# Patient Record
Sex: Female | Born: 1935
Health system: Southern US, Community
[De-identification: ages and names within clinical notes are randomized; demographics above are authoritative.]

## PROBLEM LIST (undated history)

## (undated) DIAGNOSIS — K589 Irritable bowel syndrome without diarrhea: Secondary | ICD-10-CM

## (undated) DIAGNOSIS — T4145XA Adverse effect of unspecified anesthetic, initial encounter: Secondary | ICD-10-CM

## (undated) DIAGNOSIS — J302 Other seasonal allergic rhinitis: Secondary | ICD-10-CM

## (undated) DIAGNOSIS — R112 Nausea with vomiting, unspecified: Secondary | ICD-10-CM

## (undated) DIAGNOSIS — C4491 Basal cell carcinoma of skin, unspecified: Secondary | ICD-10-CM

## (undated) DIAGNOSIS — M719 Bursopathy, unspecified: Secondary | ICD-10-CM

## (undated) DIAGNOSIS — M1990 Unspecified osteoarthritis, unspecified site: Secondary | ICD-10-CM

## (undated) DIAGNOSIS — D1430 Benign neoplasm of unspecified bronchus and lung: Secondary | ICD-10-CM

## (undated) DIAGNOSIS — I5189 Other ill-defined heart diseases: Secondary | ICD-10-CM

## (undated) DIAGNOSIS — D190 Benign neoplasm of mesothelial tissue of pleura: Secondary | ICD-10-CM

## (undated) DIAGNOSIS — J309 Allergic rhinitis, unspecified: Secondary | ICD-10-CM

## (undated) DIAGNOSIS — T8859XA Other complications of anesthesia, initial encounter: Secondary | ICD-10-CM

## (undated) DIAGNOSIS — R05 Cough: Secondary | ICD-10-CM

## (undated) DIAGNOSIS — I4891 Unspecified atrial fibrillation: Secondary | ICD-10-CM

## (undated) DIAGNOSIS — H269 Unspecified cataract: Secondary | ICD-10-CM

## (undated) DIAGNOSIS — H9190 Unspecified hearing loss, unspecified ear: Secondary | ICD-10-CM

## (undated) DIAGNOSIS — Z9889 Other specified postprocedural states: Secondary | ICD-10-CM

## (undated) DIAGNOSIS — D869 Sarcoidosis, unspecified: Secondary | ICD-10-CM

## (undated) DIAGNOSIS — M8580 Other specified disorders of bone density and structure, unspecified site: Secondary | ICD-10-CM

## (undated) DIAGNOSIS — I480 Paroxysmal atrial fibrillation: Secondary | ICD-10-CM

## (undated) DIAGNOSIS — G4700 Insomnia, unspecified: Secondary | ICD-10-CM

## (undated) DIAGNOSIS — H409 Unspecified glaucoma: Secondary | ICD-10-CM

## (undated) DIAGNOSIS — R059 Cough, unspecified: Secondary | ICD-10-CM

## (undated) DIAGNOSIS — M7550 Bursitis of unspecified shoulder: Secondary | ICD-10-CM

## (undated) DIAGNOSIS — E785 Hyperlipidemia, unspecified: Secondary | ICD-10-CM

## (undated) DIAGNOSIS — T148XXA Other injury of unspecified body region, initial encounter: Secondary | ICD-10-CM

## (undated) DIAGNOSIS — Z9289 Personal history of other medical treatment: Secondary | ICD-10-CM

## (undated) DIAGNOSIS — Q2546 Tortuous aortic arch: Secondary | ICD-10-CM

## (undated) HISTORY — DX: Other specified disorders of bone density and structure, unspecified site: M85.80

## (undated) HISTORY — DX: Bursitis of unspecified shoulder: M75.50

## (undated) HISTORY — PX: WRIST SURGERY: SHX841

## (undated) HISTORY — DX: Other ill-defined heart diseases: I51.89

## (undated) HISTORY — DX: Basal cell carcinoma of skin, unspecified: C44.91

## (undated) HISTORY — DX: Benign neoplasm of unspecified bronchus and lung: D14.30

## (undated) HISTORY — DX: Hyperlipidemia, unspecified: E78.5

## (undated) HISTORY — DX: Bursopathy, unspecified: M71.9

## (undated) HISTORY — DX: Unspecified cataract: H26.9

## (undated) HISTORY — DX: Allergic rhinitis, unspecified: J30.9

## (undated) HISTORY — PX: VAGINAL HYSTERECTOMY: SHX2639

## (undated) HISTORY — PX: ABDOMINAL HYSTERECTOMY: SHX81

## (undated) HISTORY — DX: Unspecified atrial fibrillation: I48.91

## (undated) HISTORY — PX: APPENDECTOMY: SHX54

## (undated) HISTORY — DX: Insomnia, unspecified: G47.00

## (undated) HISTORY — PX: TONSILLECTOMY: SUR1361

## (undated) HISTORY — DX: Unspecified glaucoma: H40.9

## (undated) HISTORY — DX: Unspecified hearing loss, unspecified ear: H91.90

## (undated) HISTORY — DX: Tortuous aortic arch: Q25.46

## (undated) HISTORY — DX: Sarcoidosis, unspecified: D86.9

## (undated) HISTORY — DX: Other injury of unspecified body region, initial encounter: T14.8XXA

## (undated) HISTORY — DX: Paroxysmal atrial fibrillation: I48.0

## (undated) HISTORY — DX: Unspecified osteoarthritis, unspecified site: M19.90

## (undated) HISTORY — DX: Benign neoplasm of mesothelial tissue of pleura: D19.0

## (undated) HISTORY — DX: Irritable bowel syndrome, unspecified: K58.9

## (undated) HISTORY — DX: Other seasonal allergic rhinitis: J30.2

---

## 2001-07-02 ENCOUNTER — Emergency Department (HOSPITAL_COMMUNITY): Admission: EM | Admit: 2001-07-02 | Discharge: 2001-07-02 | Payer: Self-pay | Admitting: Emergency Medicine

## 2001-07-03 ENCOUNTER — Encounter: Payer: Self-pay | Admitting: Cardiology

## 2001-07-03 ENCOUNTER — Ambulatory Visit (HOSPITAL_COMMUNITY): Admission: RE | Admit: 2001-07-03 | Discharge: 2001-07-03 | Payer: Self-pay | Admitting: Cardiology

## 2001-09-30 ENCOUNTER — Other Ambulatory Visit: Admission: RE | Admit: 2001-09-30 | Discharge: 2001-09-30 | Payer: Self-pay | Admitting: Internal Medicine

## 2001-10-06 ENCOUNTER — Encounter: Admission: RE | Admit: 2001-10-06 | Discharge: 2001-10-06 | Payer: Self-pay | Admitting: Internal Medicine

## 2001-10-06 ENCOUNTER — Encounter: Payer: Self-pay | Admitting: Internal Medicine

## 2003-11-16 ENCOUNTER — Encounter: Admission: RE | Admit: 2003-11-16 | Discharge: 2003-11-16 | Payer: Self-pay | Admitting: Internal Medicine

## 2004-11-17 ENCOUNTER — Encounter: Admission: RE | Admit: 2004-11-17 | Discharge: 2004-11-17 | Payer: Self-pay | Admitting: Internal Medicine

## 2005-04-30 DIAGNOSIS — D190 Benign neoplasm of mesothelial tissue of pleura: Secondary | ICD-10-CM

## 2005-04-30 HISTORY — DX: Benign neoplasm of mesothelial tissue of pleura: D19.0

## 2005-04-30 HISTORY — PX: FRACTURE SURGERY: SHX138

## 2005-05-10 ENCOUNTER — Inpatient Hospital Stay (HOSPITAL_COMMUNITY): Admission: RE | Admit: 2005-05-10 | Discharge: 2005-05-12 | Payer: Self-pay | Admitting: Obstetrics and Gynecology

## 2005-05-10 ENCOUNTER — Encounter (INDEPENDENT_AMBULATORY_CARE_PROVIDER_SITE_OTHER): Payer: Self-pay | Admitting: Specialist

## 2005-05-16 ENCOUNTER — Observation Stay (HOSPITAL_COMMUNITY): Admission: AD | Admit: 2005-05-16 | Discharge: 2005-05-17 | Payer: Self-pay | Admitting: Obstetrics & Gynecology

## 2005-08-13 ENCOUNTER — Emergency Department (HOSPITAL_COMMUNITY): Admission: EM | Admit: 2005-08-13 | Discharge: 2005-08-13 | Payer: Self-pay | Admitting: Emergency Medicine

## 2005-08-21 ENCOUNTER — Encounter: Admission: RE | Admit: 2005-08-21 | Discharge: 2005-08-21 | Payer: Self-pay | Admitting: Internal Medicine

## 2005-09-03 ENCOUNTER — Encounter: Admission: RE | Admit: 2005-09-03 | Discharge: 2005-09-03 | Payer: Self-pay | Admitting: Internal Medicine

## 2005-10-01 ENCOUNTER — Encounter (INDEPENDENT_AMBULATORY_CARE_PROVIDER_SITE_OTHER): Payer: Self-pay | Admitting: Specialist

## 2005-10-01 ENCOUNTER — Ambulatory Visit (HOSPITAL_COMMUNITY): Admission: RE | Admit: 2005-10-01 | Discharge: 2005-10-01 | Payer: Self-pay | Admitting: Surgery

## 2005-10-05 ENCOUNTER — Ambulatory Visit: Payer: Self-pay | Admitting: Internal Medicine

## 2005-11-15 ENCOUNTER — Ambulatory Visit: Payer: Self-pay | Admitting: Internal Medicine

## 2005-11-20 ENCOUNTER — Ambulatory Visit: Payer: Self-pay | Admitting: Internal Medicine

## 2005-11-20 ENCOUNTER — Encounter: Payer: Self-pay | Admitting: Cardiology

## 2005-11-20 ENCOUNTER — Ambulatory Visit: Payer: Self-pay

## 2005-11-29 ENCOUNTER — Ambulatory Visit: Payer: Self-pay | Admitting: Internal Medicine

## 2005-11-29 ENCOUNTER — Inpatient Hospital Stay (HOSPITAL_COMMUNITY): Admission: RE | Admit: 2005-11-29 | Discharge: 2005-11-29 | Payer: Self-pay | Admitting: Internal Medicine

## 2005-12-03 ENCOUNTER — Inpatient Hospital Stay (HOSPITAL_COMMUNITY): Admission: RE | Admit: 2005-12-03 | Discharge: 2005-12-06 | Payer: Self-pay | Admitting: Surgery

## 2005-12-03 ENCOUNTER — Encounter (INDEPENDENT_AMBULATORY_CARE_PROVIDER_SITE_OTHER): Payer: Self-pay | Admitting: Specialist

## 2005-12-03 HISTORY — PX: OTHER SURGICAL HISTORY: SHX169

## 2005-12-11 ENCOUNTER — Encounter: Admission: RE | Admit: 2005-12-11 | Discharge: 2005-12-11 | Payer: Self-pay | Admitting: Surgery

## 2006-01-14 ENCOUNTER — Ambulatory Visit: Payer: Self-pay | Admitting: Internal Medicine

## 2006-01-21 ENCOUNTER — Ambulatory Visit: Payer: Self-pay | Admitting: Internal Medicine

## 2006-01-30 ENCOUNTER — Encounter: Admission: RE | Admit: 2006-01-30 | Discharge: 2006-01-30 | Payer: Self-pay | Admitting: Internal Medicine

## 2006-02-26 ENCOUNTER — Ambulatory Visit: Payer: Self-pay | Admitting: Internal Medicine

## 2006-04-30 HISTORY — PX: ROTATOR CUFF REPAIR: SHX139

## 2006-05-07 ENCOUNTER — Ambulatory Visit: Payer: Self-pay | Admitting: Internal Medicine

## 2006-07-12 ENCOUNTER — Ambulatory Visit (HOSPITAL_COMMUNITY): Admission: RE | Admit: 2006-07-12 | Discharge: 2006-07-13 | Payer: Self-pay | Admitting: Orthopedic Surgery

## 2006-10-31 ENCOUNTER — Ambulatory Visit: Payer: Self-pay | Admitting: Internal Medicine

## 2007-01-21 ENCOUNTER — Ambulatory Visit: Payer: Self-pay | Admitting: Surgery

## 2007-01-21 ENCOUNTER — Encounter: Admission: RE | Admit: 2007-01-21 | Discharge: 2007-01-21 | Payer: Self-pay | Admitting: Surgery

## 2007-02-11 ENCOUNTER — Encounter: Admission: RE | Admit: 2007-02-11 | Discharge: 2007-02-11 | Payer: Self-pay | Admitting: Internal Medicine

## 2007-03-06 ENCOUNTER — Ambulatory Visit: Payer: Self-pay | Admitting: Internal Medicine

## 2007-12-24 ENCOUNTER — Ambulatory Visit: Payer: Self-pay | Admitting: Internal Medicine

## 2008-02-10 ENCOUNTER — Encounter: Admission: RE | Admit: 2008-02-10 | Discharge: 2008-02-10 | Payer: Self-pay | Admitting: Surgery

## 2008-02-10 ENCOUNTER — Ambulatory Visit: Payer: Self-pay | Admitting: Surgery

## 2008-02-24 ENCOUNTER — Encounter: Admission: RE | Admit: 2008-02-24 | Discharge: 2008-02-24 | Payer: Self-pay | Admitting: Obstetrics and Gynecology

## 2009-02-23 ENCOUNTER — Encounter: Admission: RE | Admit: 2009-02-23 | Discharge: 2009-02-23 | Payer: Self-pay | Admitting: Internal Medicine

## 2009-03-29 ENCOUNTER — Encounter: Admission: RE | Admit: 2009-03-29 | Discharge: 2009-03-29 | Payer: Self-pay | Admitting: Surgery

## 2009-03-29 ENCOUNTER — Ambulatory Visit: Payer: Self-pay | Admitting: Surgery

## 2010-03-28 ENCOUNTER — Encounter: Admission: RE | Admit: 2010-03-28 | Discharge: 2010-03-28 | Payer: Self-pay | Admitting: Surgery

## 2010-03-28 ENCOUNTER — Ambulatory Visit: Payer: Self-pay | Admitting: Surgery

## 2010-04-11 ENCOUNTER — Encounter
Admission: RE | Admit: 2010-04-11 | Discharge: 2010-04-11 | Payer: Self-pay | Source: Home / Self Care | Attending: Internal Medicine | Admitting: Internal Medicine

## 2010-05-21 ENCOUNTER — Encounter: Payer: Self-pay | Admitting: Surgery

## 2010-09-12 NOTE — Assessment & Plan Note (Signed)
[facility]                         ELECTROPHYSIOLOGY OFFICE NOTE   NAME:[name], Andrea HOAGLUND                          MRN:          [id number]  DATE:12/24/2007                            DOB:          1935-12-20    [name] comes in today with her husband bringing tomatoes.  She has  had very little racing over the last year, she does have an occasional  skip.   Her medications are unchanged.  Her blood pressure today was 90/60 with  a pulse of 76, weight was 152 which is down a 2 pounds.  Lungs were  clear.  Heart sounds were regular.  Extremities were without edema.   Electrocardiogram demonstrates low voltage, which is chronic.  The rate  was 83 in sinus.  The intervals were 0.18/0.07/0.38.   IMPRESSION:  1. Atrioventricular nodal reentry status post slow pathway      modification.  2. Some residual atrial arrhythmias, fairly quiescent.   [name] is doing well at this time.   We will plan to see her again in 1 year's time.     Duke Salvia, MD, [facility]  Electronically Signed    SCK/MedQ  DD: 12/24/2007  DT: 12/25/2007  Job #: [id number]

## 2010-09-12 NOTE — Assessment & Plan Note (Signed)
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  I saw her last on March 29, 2009.  She has been doing  well since then and has no complaints.  She denies any chest pain.  She  states that she gets winded with heavy activity, but this is not new.  She has had no cough or hemoptysis.   PHYSICAL EXAMINATION:  Blood pressure 114/64, pulse 96 and regular, and  respiratory rate 16 and unlabored.  Oxygen saturation on room air is  98%.  She looks well.  Cardiac exam shows regular rate and rhythm with  normal heart sounds.  Her lung exam is clear.  The left thoracotomy  incision is well healed.  There are no skin lesions.  There is no  cervical, supraclavicular, or axillary adenopathy.   DIAGNOSTIC TESTS:  Chest x-ray today shows clear lung fields and no  pleural effusions.  There is no evidence of recurrent or metastatic  disease.   IMPRESSION:  The patient continues to do well without evidence of  recurrent or metastatic tumor.  I will plan to see her back in 1 year  with a repeat chest x-ray.   Evelene Croon, M.D.  Electronically Signed   BB/MEDQ  D:  03/28/2010  T:  03/28/2010  Job:  [id number]

## 2010-09-12 NOTE — Assessment & Plan Note (Signed)
OFFICE VISIT   [name], Andrea B  DOB:  November 15, 1935                                        February 10, 2008  CHART #:  [id number]   The patient returned today for followup, status post left thoracotomy  and wedge resection of left lower lobe lung mass on December 03, 2005,  which proved to be a solitary fibrous tumor of the pleura on pathologic  examination.  Since I last saw her on January 22, 2007, she has been  feeling well with no chest discomfort.  She has had no shortness of  breath and no sputum production.   PHYSICAL EXAMINATION:  VITAL SIGNS:  Her blood pressure 131/72, her  pulse is 88 and regular, and respiratory rate 18, unlabored.  Oxygen  saturation on room air is 99%.  GENERAL:  She looks well.  CARDIAC:  Regular rate and rhythm with normal heart sounds.  LUNGS:  Clear.  CHEST:  The left chest incision is well healed.  There are no chest wall  lesions palpable.   Followup chest x-ray today shows chronic postoperative changes in the  left lung apex.  There is no sign of recurrent tumor.   IMPRESSION:  Overall, the patient is recovering well and shows no sign  of recurrent solitary fibrous tumor.  I will plan to see her back in 1  year with a repeat chest x-ray.   Evelene Croon, M.D.  Electronically Signed   BB/MEDQ  D:  02/10/2008  T:  02/11/2008  Job:  [id number]

## 2010-09-12 NOTE — Assessment & Plan Note (Signed)
OFFICE VISIT   [name], Andrea B  DOB:  05-07-1935                                        March 29, 2009  CHART #:  [id number]   HISTORY:  The patient returned today for followup status post left  thoracotomy and wedge resection of a left lower lobe lung mass on December 03, 2005.  This was a solitary fibrous tumor of the pleura on pathologic  examination.  I last saw her on February 10, 2008, at which time she was  doing well.  Since her last visit, she has had no complaints.  She has  had no chest pain or shortness of breath.  She denies any cough or  hemoptysis.   PHYSICAL EXAMINATION:  Today, blood pressure is 127/69, pulse is 78 and  regular, respiratory rate is 18 and unlabored.  Oxygen saturation on  room air is 98%.  She looks well.  Cardiac exam shows a regular rate and  rhythm with normal heart sounds.  Her lung exam is clear.  The left  thoracotomy incision is well healed.  There are no skin lesions.  There  is no cervical or supraclavicular adenopathy.   DIAGNOSTIC TESTS:  Followup chest x-ray shows clear lung fields and no  pleural effusions.  There is no sign of any recurrent or metastatic  disease.   IMPRESSION:  Overall, the patient is making a continued good recovery  following her surgery.  She has no evidence of recurrent or metastatic  disease.  I recommended that we continue seeing her back yearly with a  followup chest x-ray.   Evelene Croon, M.D.  Electronically Signed   BB/MEDQ  D:  03/29/2009  T:  03/30/2009  Job:  [id number]
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
## 2010-09-12 NOTE — Assessment & Plan Note (Signed)
OFFICE VISIT   [name], Andrea B  DOB:  28-Feb-1936                                        January 22, 2007  CHART #:  [id number]   The patient returns today for follow up, approximately 13 months status  post left thoracotomy and wedge resection of a left lower lobe lung mass  that was found to be a solitary, fibrous tumor of the pleura on  pathologic examination.  She has been feeling well overall with minimal  residual chest was discomfort.  She has had no cough or sputum  production.  No shortness of breath.   PHYSICAL EXAMINATION:  VITAL SIGNS:  Blood pressure 116/66, pulse 93 and  regular, respiratory rate 18 and unlabored.  Oxygen saturation is 98% on  room air.  GENERAL:  She looks well.  LUNGS: Clear.  The left chest incision is well-healed.  CARDIAC:  Regular rate and rhythm with normal heart sounds.   A follow up chest x-ray shows no active disease.  There was no sign of  recurrent tumor.   IMPRESSION:  The patient is 13 months status post resection of a  solitary fibrous tumor.  The risk of recurrence is extremely low, but  this should be followed up with another chest x-ray in about 1 year.  We  will see her back at that time.   Evelene Croon, M.D.  Electronically Signed   BB/MEDQ  D:  01/22/2007  T:  01/23/2007  Job:  [id number]

## 2010-09-15 NOTE — Op Note (Signed)
NAMEDESARE, DUDDY                   ACCOUNT NO.:  [id number]   MEDICAL RECORD NO.:  [id number]          PATIENT TYPE:  INP   LOCATION:  2550                         FACILITY:  [facility]   PHYSICIAN:  Evelene Croon, M.D.     DATE OF BIRTH:  Nov 22, 1935   DATE OF PROCEDURE:  12/03/2005  DATE OF DISCHARGE:                                 OPERATIVE REPORT   PREOPERATIVE DIAGNOSIS:  Pleural-based left chest mass, suspect benign  fibrous tumor.   POSTOPERATIVE DIAGNOSIS:  Pleural-based left chest mass, suspect benign  fibrous tumor.   OPERATIVE PROCEDURE:  Left video-assisted thoracoscopy, left thoracotomy  with wedge resection of left lower lobe lung mass.  Insertion of On-Q pain  pump.   SURGEON:  Evelene Croon, M.D.   ASSISTANT:  Jerold Coombe, PA-C.   ANESTHESIA:  General endotracheal.   CLINICAL HISTORY:  This patient is a 75 year old woman with a history of  mitral valve prolapse and tachyarrhythmias who had a chest x-ray performed  when she presented with an episode of tachyarrhythmia that showed a  posteriorly located mass obscuring the left hemidiaphragm.  She underwent a  CT scan of the chest that showed a 3.7 x 7.7 cm ovoid pleural-based mass in  the posterior inferior left hemithorax.  It did have calcium in it  suggesting a benign process.  She subsequently underwent a CT-guided needle  biopsy which showed fibrous tissue consistent with a benign fibrous tumor.  Given its size, it was felt that this should be resected.  I discussed the  operative procedure with the patient and her husband including alternatives,  benefits, and risks including bleeding, blood transfusion, infection, injury  to the lung, prolonged air leak, and recurrence of the tumor.  They  understood and agreed to proceed.   OPERATIVE PROCEDURE:  The patient was taken to the operating room and placed  on the table in supine position.  After induction of general endotracheal  anesthesia using a  double-lumen endotracheal tube, a Foley catheter was  placed in the bladder using sterile technique.  Pneumatic compression  stockings were placed on the legs.  Preoperative intravenous antibiotics  were given.  The patient was then turned in the right lateral decubitus  position with the left side up.  The left side of the chest was prepped with  Betadine soap and solution and draped in the usual sterile manner.  A time-  out was taken and the proper patient, proper operative side and proper  operation were confirmed with the nursing and anesthesia staff.   Then a 1 cm incision was made in the mid axillary line over the fifth  intercostal space and a 10 mm trocar was inserted.  The thoracoscope was  inserted and the pleural space examined.  There was a mass in the posterior  inferior pleural space which appeared to be coming from the edge of the left  lower lobe.  There was some adhesion in one area to the diaphragm.  This was  a well-circumscribed lesion that did have some calcium in it and appeared to  be a benign lesion.  There were no other lesions noted in the __________  space.  There was no significant pleural effusion.   Given the size of the tumor, I felt that I would have to make a small  thoracotomy incision in order to remove it.  Therefore, a short lateral  thoracotomy incision was made.  The latissimus muscle was divided but the  serratus muscle was retracted anteriorly.  The pleural space was entered  through the sixth intercostal space.  Then with video assistance, I was able  to divide the adhesion between the mass and the diaphragm.  These were  loose, filmy adhesions.  There did not appear to be any invasion of the  diaphragm.  After these adhesions were divided it was easy to retract the  tumor up into the incision.  There was a narrow-based attachment to the edge  of left lower lobe.  A wedge resection was performed using a 45 mm linear  stapler. The specimen was  passed to nursing and given to pathology for  permanent section.  The staple line was hemostatic.  Then an On-Q pain  catheter was inserted.  A small stab incision was made inferior to the chest  incision.  The introducer and trocar were inserted and advanced in a  subpleural location to cross the interspace of the thoracotomy posteriorly.  Then the catheter was inserted through the introducer and introducer  removed.  The catheter was fixed to the skin.  Then the catheter was primed  with 5 mL of 0.5% Marcaine solution.  It was then connected to the pain  pump.  Then a single 28-French chest tube was brought through a separate  stab incision and positioned in the posterior hemithorax.  The lung was re-  expanded.  The ribs were reapproximated with #2 Vicryl pericostal sutures.  The serratus muscle was returned to is normal anatomic position.  The  latissimus muscle was reapproximated with a continuous #1 Vicryl suture.  The subcutaneous tissue was closed with continuous 2-0 Vicryl and the skin  with a 3-0 Vicryl subcuticular closure.  The sponge, needle and instrument  counts were correct __________.  A dry sterile dressing was applied over the  incision around the chest tube which was hooked to Pleur-Evac suction.  The  patient was then turned to the supine position, extubated and transported to  the post anesthesia care unit in satisfactory stable and condition.      Evelene Croon, M.D.  Electronically Signed     BB/MEDQ  D:  12/03/2005  T:  12/03/2005  Job:  [id number]   cc:   [facility]
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
## 2010-09-15 NOTE — Op Note (Signed)
[name], [name]                   ACCOUNT NO.:  [id number]   MEDICAL RECORD NO.:  [id number]          PATIENT TYPE:  INP   LOCATION:  3730                         FACILITY:  [facility]   PHYSICIAN:  Duke Salvia, MD, FACCDATE OF BIRTH:  11-09-1935   DATE OF PROCEDURE:  11/29/2005  DATE OF DISCHARGE:  11/29/2005                               OPERATIVE REPORT   PROCEDURES:  Invasive electrophysiological study and radiofrequency  catheter ablation with isoproterenol infusion, supraventricular  tachycardia.   POSTOPERATIVE DIAGNOSIS:  Noninducible sustained reentry double echoes  from the AV node and not clinically apparent atrial tachycardia.   Following obtaining informed consent, the patient was brought to the  electrophysiology laboratory and placed on the fluoroscopic table in  supine position.  After routine prep and drape, cardiac catheterization  was performed with local anesthesia and conscious sedation.  Noninvasive  blood pressure monitoring, transcutaneous oxygen saturation monitoring  and end-tidal CO2 monitoring were performed continuously.  Following the  procedure, the catheters were removed.  Hemostasis was obtained, the  patient was transferred to the floor in stable condition.   CATHETERS:  A 5-French quadripolar catheter was inserted left femoral  vein to AV junction.  A 5-French quadripolar catheter was inserted via left femoral vein to  the left ventricular apex.  A 6-French octapolar catheter was inserted via the right femoral vein to  the coronary sinus.  A 7-French 4-mm deflectable tip catheter was inserted in the right  femoral vein to mapping sites in the posterior septal space.   Surface leads 1, aVF and V1 were monitored continuously throughout the  procedure.  Following insertion of the catheters stimulation protocol  included incremental atrial pacing.  Incremental ventricular pacing.  Single atrial extra stimuli at 800, 600 500 milliseconds.  Burst  atrial pacing.   RESULTS:  Surface electrocardiogram present basic intervals.  Initial:  Rhythm:  Sinus; RR interval:  952 milliseconds; PR interval:  190  milliseconds; QRS duration:  100 milliseconds; QT interval:  427  milliseconds; bundle branch block:  Absent; pre-excitation absent.  AH interval: 106 milliseconds; HV interval 44 milliseconds.  Final:  Rhythm:  Sinus.  Other measurements are available through the  computer but unfortunately are not listed on the dictating sheet here.   AV NODAL FUNCTION:  AV Wenckebach cycle length was 450 milliseconds with  an AV nodal effective refractory period was 340 milliseconds.  AV nodal conduction pre ablation was discontinuous with inducible echo  beats AND post ablation was continuous without evidence of slow pathway  function.   ACCESSORY PATHWAY FUNCTION:  None was seen.   VENTRICULAR RESPONSE TO PROGRAMMED STIMULATION:  Was normal for  ventricular stimulation as described previously.   Arrhythmias induced.  A number of arrhythmias were induced.  First  atrial fibrillation was induced with atrial pacing.  This is sustained  and the patient was then submitted to cardioversion.  (2) Atrial flutter  was induced with atrial burst pacing.  We accelerated this atrial  fibrillation, that terminated spontaneously.  Atrial tachycardia was induced with ventricular pacing.  Cycle length  was 540 milliseconds and terminated spontaneously.  Dual AV nodal echoes were seen.   FLUOROSCOPY TIME:  A total of 4 minutes 8 seconds fluoroscopy time was  utilized.   Radiofrequency energy was applied to the area of slow pathway resulting  in junctional rhythm and following which no evidence of dual __________  physiology was seen.   IMPRESSION:  1. Normal sinus function.  2. Abnormal atrial function manifested by inducible atrial      fibrillation, atrial tachycardia.  3. Abnormal AV nodal function with inducible slow-fast dual AV nodal       echoes present during the absence of isoproterenol.  It was not      sustained.  Slow pathway modification was successfully      accomplished.  4. Normal His-Purkinje system function.  5. Noted __________ accessory pathway.  6. Normal ventricular response to programmed stimulation.   SUMMARY:  In conclusion, the results of electrophysiological testing  demonstrated evidence of a dual antegrade AV nodal physiology, which  certainly could have been the substrate for the patient's clinical  arrhythmia.  Slow pathway modification was successfully accomplished.   However, non clinical atrial tachycardias were seen in multiple and  numerous other atrial rhythms were seen suggesting underlying atriopathy  and propensity towards atrial fibrillation and atrial flutter.  This  will need to be followed up over time.      Duke Salvia, MD, [facility]  Electronically Signed     SCK/MEDQ  D:  05/29/2006  T:  05/29/2006  Job:  [id number]   cc:   Electrophys lab  Thereasa Solo. Little, M.D.  Cydney Ok, [facility]

## 2010-09-15 NOTE — Op Note (Signed)
Andrea, [name]                   ACCOUNT NO.:  [id number]   MEDICAL RECORD NO.:  [id number]          PATIENT TYPE:  AMB   LOCATION:  DAY                          FACILITY:  [facility]   PHYSICIAN:  Ronald A. Gioffre, M.D.DATE OF BIRTH:  Oct 17, 1935   DATE OF PROCEDURE:  07/12/2006  DATE OF DISCHARGE:                               OPERATIVE REPORT   SURGEON:  Georges Lynch. Darrelyn Hillock, M.D.   ASSISTANT:  Jamelle Rushing, P.A.-C.   PREOPERATIVE DIAGNOSIS:  1. Small tear of the rotator cuff tendon, left shoulder.  2. Severe chronic bursitis, left shoulder.  3. Severe impingement syndrome, left shoulder.   POSTOPERATIVE DIAGNOSIS:  1. Severe impingement syndrome, left shoulder.  2. Severe chronic subdeltoid bursa, left shoulder.  3. Abrasion type injury to the rotator cuff on the left secondary to      the impingement.   OPERATION:  1. Open acromionectomy and acromioplasty, left shoulder.  2. Excision of a large thickened bursa, left shoulder.  3. Exploration of the rotator cuff tendon, left shoulder.   PROCEDURE:  Under general anesthesia, routine orthopedic prepping and  draping of the left shoulder was carried out.  The patient had been  given 1 gram of IV Ancef preop.  At this time, an incision was made over  the anterior aspect of the left shoulder.  Bleeders were identified and  cauterized.  I made a small incision. I stripped the deltoid tendon from  the acromion.  She had marked thickening and downsloping of the  acromion.  It was literally eroding into the cuff.  I protected the  rotator cuff and utilized the oscillating saw and did a partial  acromionectomy and then utilized a bur to even out the under surface of  the acromion.  Once we established a subacromial space, I went in and  dissected out the subdeltoid bursa that was chronically inflamed.  I  explored the rotator cuff.  There was some abrasions of the cuff.  There  was no necessity to do any repairs and she obviously did  not need a  graft.  I thoroughly irrigated out the area inserted.  I inserted a  thrombin soaked Gelfoam in the subacromial space.  I reattached the  deltoid tendon and muscle in the usual fashion.  The subcu was closed  with 0 Vicryl and the skin with metal staples.  A sterile Neosporin  dressing was applied.  She was placed in a shoulder immobilizer.  Preop,  she had an interscalene block prior to taking her back to surgery.           ______________________________  Georges Lynch Darrelyn Hillock, M.D.     RAG/MEDQ  D:  07/12/2006  T:  07/13/2006  Job:  [id number]

## 2010-09-15 NOTE — Assessment & Plan Note (Signed)
[facility]                         ELECTROPHYSIOLOGY OFFICE NOTE   NAME:[name], [name]                          MRN:          [id number]  DATE:05/07/2006                            DOB:          March 31, 1936    [name] is seen.  She is status post AV nodal modification to an  adenosine responsive atrial tachycardia.  She also had some atrial  flutter and post-procedure event recorder demonstrated a tachycardia  that seems to be atrial flutter.  The symptoms specific to that were  unfortunately not clarified on the monitor.  She has had just a few  flutters since.   In some of her discussions with friends, [name] name has come  up, and these friends have had atrial fibrillation ablations and she  asked about that, and I suggested that would be a reasonable thing for  her to consider in the event that her symptoms dictated a repeat  procedure.   EXAMINATION:  Her blood pressure is 108/68.  Pulse was 78.  LUNGS:  Clear.  Heart sounds were regular.  EXTREMITIES:  Without edema.   We will plan to see her again in 6 months' time.     Duke Salvia, MD, [facility]  Electronically Signed    SCK/MedQ  DD: 05/07/2006  DT: 05/07/2006  Job #: [id number]   cc:   Gwen Pounds, MD

## 2010-09-15 NOTE — Assessment & Plan Note (Signed)
[facility]                           ELECTROPHYSIOLOGY OFFICE NOTE   NAME:Heinkel, [name]                          MRN:          [id number]  DATE:11/15/2005                            DOB:          [birth year]    HISTORY OF PRESENT ILLNESS:  [name] comes in today. She has had recurrent  SVT and has decided to proceed with RF catheter ablation. We have  previously discussed the potential benefits as well as the potential risks  including but not limited to, death, perforation, and heart block requiring  pacemaker implantation and vascular injury. She understands these risks and  would like to proceed.   In anticipation of her undergoing surgery for a lung tumor that is thought  to be benign, she is trying to expedite this and we have tentatively  scheduled her for November 27, 2005. We will plan to get an ultrasound prior to  the procedure.                                   Duke Salvia, MD, [facility]   SCK/MedQ  DD:  11/15/2005  DT:  11/15/2005  Job #:  [id number]   cc:   Gwen Pounds, MD  Evelene Croon, MD
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
## 2010-09-15 NOTE — Assessment & Plan Note (Signed)
[facility]                           ELECTROPHYSIOLOGY OFFICE NOTE   NAME:Garbutt, [name]                          MRN:          [id number]  DATE:01/14/2006                            DOB:          04/24/1936    [name] is seen following a SVT slope pathway modification.  She has  complained of some palpitations.  She just didn't feel right, but it was  quite different from her previous SVT.  She went to see Dr. Laneta Simmers that day.  They do not have a tracing of it.  Instead, the heart rate slowed from 120  to 90.  I should note that during her EP study, she had atrial fibrillation.  She has also had some brief 3- to 5-second episodes of heart pounding.   We will plan to undertake an 30-day event recorder as her blood pressure  today is only 98/56 and empiric therapy is, thus, not appealing.   We will see her again in 5 weeks' time after the monitor.                                   Duke Salvia, MD, [facility]   SCK/MedQ  DD:  01/14/2006  DT:  01/15/2006  Job #:  [id number]   cc:   Gwen Pounds, MD  Evelene Croon, M.D.

## 2010-09-15 NOTE — Assessment & Plan Note (Signed)
[facility]                           ELECTROPHYSIOLOGY OFFICE NOTE   NAME:[name], Andrea SOBIECH                          MRN:          [id number]  DATE:02/26/2006                            DOB:          [birth year]    Patient returns following RF catheter ablation for dual AV nodal physiology  and an Adenosine responsive tachycardia.  At this study, she also had atrial  fibrillation and atrial flutter, which were not noted to be clinical.  Because of post procedural flutter, she was given an event loop recorder.  These identified a number of arrhythmias, including 1)  PACs isolated in  couplets and then in trigeminal patterns.  2)  PVCs similar to the above  with isolated couplets and trigeminy.  3)  In atrial flutter, determination  of which we see duration, which we do not know with a ventricular response  of about 140 beats per minute.   Her [place] score is 0.   PHYSICAL EXAMINATION:  VITAL SIGNS:  Her blood pressure today was 96/60,  pulse 80.  LUNGS:  Clear.  HEART:  Heart sounds were regular.   I reviewed the rhythms extensively with the patient and her husband.  Given  her relative hypotension, it becomes difficult to think about AV nodal  blocking agents, i.e., beta blockers or calcium blockers for suppression of  her ventricular atrial ectopy or her SVT.   The other issue as related to her atrial flutter is a thromboembolic risk.  We discussed extensively the fact that thromboembolic risk is a continuous,  not dichotomized, variable with the [place] score not withstanding that of 0,  that I think it is a reasonable thing to consider RF catheter ablation for  reduction of the thromboembolic risks related to the atrial flutter  substrate.  She voiced understanding.  She voiced concern about having  recurrent atrial flutter, of which she might not be aware, and its potential  implications is release of thromboembolism.   We decided that we would get  together again in January in the new year to  see how it is that she is thinking about the above.    ______________________________  Duke Salvia, MD, [facility]    SCK/MedQ  DD: 02/26/2006  DT: 02/26/2006  Job #: [id number]   cc:   Gwen Pounds, MD

## 2010-09-15 NOTE — Discharge Summary (Signed)
[name], [name]                   ACCOUNT NO.:  [id number]   MEDICAL RECORD NO.:  [id number]          PATIENT TYPE:  INP   LOCATION:  9304                          FACILITY:  [facility]   PHYSICIAN:  Randye Lobo, M.D.   DATE OF BIRTH:  [birth year]   DATE OF ADMISSION:  05/10/2005  DATE OF DISCHARGE:  05/12/2005                                 DISCHARGE SUMMARY   ADMISSION DIAGNOSES:  1.  Complete uterovaginal prolapse.  2.  Probable genuine stress incontinence.   DISCHARGE DIAGNOSES:  1.  Complete uterovaginal prolapse.  2.  Probable genuine stress incontinence.  3.  Status post laparoscopically assisted vaginal hysterectomy with      bilateral salpingo-oophorectomy, McCall culdoplasty, anterior and      posterior colporrhaphy, tension-free vaginal tape and cystoscopy.   OPERATIONS:  The patient underwent a laparoscopically assisted vaginal  hysterectomy with bilateral salpingo-oophorectomy, anterior and posterior  colporrhaphy, tension-free vaginal tape and cystoscopy under the direction  of Dr. Edward Jolly with the assistance of Dr. Lodema Hong, and the [facility] on 05/10/2005.   ADMISSION HISTORY AND PHYSICAL EXAMINATION:  The patient is a 75 year old  para 2 Caucasian female who was referred by [name], M.D. for  evaluation and treatment of complete uterine procidentia and voiding  dysfunction along with defecatory dysfunction. The patient had a  longstanding history of severe constipation and she currently was requiring  perineal splinting to have successful bowel movements. The patient was also  having problems with urinary urgency and incomplete voiding. She did have  urodynamic testing performed preoperatively which did appear to document  some stress-induced detrusor instability. The patient also had an obstructed  voiding pattern on her uroflometry study.   The patient's medical history was significant for mitral valve prolapse.   On physical  examination the patient was noted to have a normal external  genitalia and a normal urethra. She had obvious and complete uterine and  vaginal prolapse. There was no evidence of any vaginal ulcerations nor  lesions of the cervix. The uterus was noted to be small and nontender. No  adnexal masses or tenderness were appreciated.   HOSPITAL COURSE:  The patient was admitted on 05/10/2005 for surgical  treatment of her prolapse and urinary dysfunction. The patient underwent a  laparoscopically assisted vaginal hysterectomy with bilateral salpingo-  oophorectomy, McCall culdoplasty, anterior and posterior colporrhaphy,  tension-free vaginal tape and cystoscopy under the direction of Dr. Conley Simmonds with the assistance of Dr. Lodema Hong. The patient's surgery was  uncomplicated and estimated blood loss was 300 mL.   Postoperatively the patient had a benign surgical recovery. The patient's  pain was controlled with a morphine PCA and Toradol during the immediate  postsurgical period. Early in the morning following the patient's surgery,  she experienced episodes of shivers and shaking. The patient had stable  vital signs and a stable oxygen saturation during this time. There was no  evidence of any seizure activity. The patient had evaluation by the  anesthesia who documented stability of the patient. The patient had  a  history of similar episodes like this in the past as well.   By postoperative day #1, the patient was able to ambulate. She underwent  voiding trials and voided successfully with postvoid residuals of less than  50 mL, so her bladder training was therefore discontinued and she remained  without a catheter. She was successfully converted over to oral pain  medication, including Percocet and ibuprofen which controlled her pain well.  The patient did receive both PAS stockings and TED hose for DVT prophylaxis  during her hospitalization. The patient was treated with ampicillin  and  gentamicin preoperatively and ampicillin postoperatively for SBE  prophylaxis. The patient is postoperative day #1 hemoglobin was 11.7 and she  was tolerating this well.   The patient remained afebrile throughout her hospitalization. She had  minimal vaginal bleeding. Her suprapubic incisions remained intact and  without evidence of any erythema.   The patient was found to be in good condition and ready for discharge on  postoperative day #2.   INSTRUCTIONS AT DISCHARGE.:  1.  Discharged to home.  2.  The patient will take the following medications:      1.  Percocet (5 milligrams/325 milligrams) one to two p.o. q.4-6 hours          p.r.n. pain.      2.  Ibuprofen 600 milligrams p.o. q.6 h p.r.n. pain.      3.  Ambien 10 milligrams 1/2 tablet p.o. q.h.s. p.r.n. insomnia.   The patient will follow a regular diet. The patient will have decreased  activity for the next 12 weeks. She will not drive for 2 weeks. She will not  lift anything over 10 pounds for the next 12 weeks.   The patient will follow up in the office in 7-10 days for an incision check.  She will call if she experiences any problems with fever, nausea and  vomiting, pain uncontrolled by her medication, difficulty with urination,  heavy vaginal bleeding, or any other concerns.      Randye Lobo, M.D.  Electronically Signed     BES/MEDQ  D:  07/17/2005  T:  07/18/2005  Job:  [id number]

## 2010-09-15 NOTE — Op Note (Signed)
[name], [name]                   ACCOUNT NO.:  [id number]   MEDICAL RECORD NO.:  [id number]          PATIENT TYPE:  INP   LOCATION:  9304                          FACILITY:  [facility]   PHYSICIAN:  Randye Lobo, M.D.   DATE OF BIRTH:  Dec 16, 1935   DATE OF PROCEDURE:  05/10/2005  DATE OF DISCHARGE:                                 OPERATIVE REPORT   PREOPERATIVE DIAGNOSIS:  1.  Complete uterovaginal prolapse.  2.  Probable genuine stress incontinence.   POSTOPERATIVE DIAGNOSIS:  1.  Complete uterovaginal prolapse.  2.  Probable genuine stress incontinence.   PROCEDURE:  Laparoscopically assisted vaginal hysterectomy with bilateral  salpingo-oophorectomy, anterior and posterior colporrhaphy, tension-free  vaginal tape, cystoscopy.   SURGEON:  Conley Simmonds, M.D.   ASSISTANT:  Lodema Hong, M.D.   ANESTHESIA:  General endotracheal, local with 0.5% Marcaine.   IV FLUIDS:  2300 mL Ringer's lactate.   ESTIMATED BLOOD LOSS:  300 mL.   URINE OUTPUT:  250 mL.   COMPLICATIONS:  None.   INDICATIONS FOR PROCEDURE:  The patient is a 75 year old para 2 Caucasian  female referred by [name], M.D. for evaluation of complete  uterine procidentia and urinary urgency. The patient also reported  incomplete voiding and difficulty with bowel movements. The patient had  urodynamic testing in the office with a vaginal packing in place and with a  maximum bladder capacity of 429 mL, the patient did have urinary  incontinence with a Valsalva maneuver. This was thought to be possibly a  stress-induced detrusor instability. The patient wished for surgical  treatment of her prolapse and she requested removal of the tubes and ovaries  as well. The patient was counseled regarding the findings with the  urodynamic study and based on the complete procidentia on examination, the  decision was made to proceed with a tension free vaginal tape after risks,  benefits, and alternatives were  reviewed.   FINDINGS:  Exam under anesthesia revealed complete uterine procidentia.   At the time of laparoscopy, the patient was noted to have a small uterus  with a possible intramural fundal fibroid. Each of the ovaries were atrophic  and without lesions and the fallopian tubes were unremarkable. There was no  evidence of any adhesive disease in the abdomen or in the pelvis.   In the upper abdomen, the liver was unremarkable. The gallbladder was  visualized and was unremarkable. The stomach organ also appeared to be  normal.   Cystoscopy after placement of the sling and needle passers documented the  absence of the foreign body in either the bladder or the urethra. The  bladder was visualized throughout 360 degrees including the bladder dome and  trigone. The ureters were noted to be patent bilaterally.   SPECIMENS:  The uterus, tubes and ovaries were sent to pathology.   PROCEDURE:  The patient was reidentified in the preoperative hold area. She  did receive both ampicillin and gentamicin for her mitral valve prolapse.  She did receive TED hose and PAS stockings for DVT prophylaxis.   The patient was brought  to the operating room where general endotracheal  anesthesia was induced. She was then placed in the dorsal lithotomy  position. The abdomen and vagina were sterilely prepped and draped and a  Foley catheter was placed inside the bladder. Initially a single-tooth  tenaculum and a Hulka tenaculum were placed in the uterine cavity and a  single-tooth tenaculum and a Hasson cannula were placed inside the uterine  cavity. Later during the course of the laparoscopic portion of the procedure  this did seem to be converted over to a Hulka tenaculum for better mobility  of the uterus.   The laparoscopy began by making an umbilical incision with a scalpel. The  incision was carried down to the fascia using an Allis clamp. A 10 mm trocar  was inserted directly into the peritoneal  cavity without difficulty. The  laparoscope confirmed proper placement and the pneumoperitoneum was then  achieved with CO2 gas. The patient was then placed in the Trendelenburg  position. 5 mm incisions were created in the right and left lower quadrants  and 5 mm trocars were placed to each of these incisions under visualization  of laparoscope. An inspection of the pelvic and abdominal organs was  performed and the findings were as noted above.   The left pelvic sidewall was examined and the ureter was identified on this  side. The left tube and ovary were then elevated and the gyrus instrument  was used to coagulate the infundibulopelvic ligament which was then divided  using the same instrument. The dissection continued along the broad  ligaments using the gyrus instrument and this continued down to the level of  the round ligament on the patient's left-hand side. Ligament was coagulated  and then sharply divided with the gyrus instrument. The same procedure that  was performed on the left-hand side was then repeated on the patient's right-  hand side after the right ureter was identified. Again the dissection was  carried down through the level of the round ligament on the patient's right-  hand side. Hemostasis was assured laparoscopically before then proceeding  with the vaginal portion of the procedure.   The patient was placed in the high lithotomy position. A Jacobs tenaculum  was placed on the cervix which was injected with half percent lidocaine with  1:200,000 of epinephrine. The cervix was then circumscribed with the  scalpel. The cervix was noted to be very long and thin. Both the anterior  cul-de-sac and the posterior cul-de-sac could not be entered initially.  Heaney clamps were used to the beginning the very distal aspects of the  uterosacral ligaments which were then sharply divided and suture ligated with transfixing sutures of 0 Vicryl. A second clamp on each of the   uterosacral ligaments was performed and these were sharply divided prior to  placing again a transfixing suture of 0 Vicryl bilaterally. The descending  branches of the uterine arteries were then clamped, sharply divided, and  suture ligated with 0 Vicryl bilaterally. The anterior cul-de-sac was  entered sharply at this time and digital exam confirmed proper entry. The  posterior cul-de-sac was similarly sharply entered at this time and again  digital exam confirmed proper entry into this space. A weighted speculum was  placed inside the peritoneal cavity and Deaver retractor was placed in the  anterior cul-de-sac. The remainder of the cardinal ligaments were then  clamped, sharply divided, and suture ligated with simple sutures of 0 Vicryl  for excellent hemostasis. The inferior aspects of the  broad ligaments were  clamped, sharply divided, and suture ligated with 0 Vicryl. At this time the  tenaculum was used to evert the uterus and the remainder of the broad  ligaments were clamped, sharply divided bilaterally, and then tied with free  ties of 0 Vicryl followed by suture ligatures of the same. At this point the  specimen was completely removed and sent to pathology. All of the pedicle  sites were examined and found to be hemostatic.   The short weighted speculum was placed inside the vagina. The posterior  vaginal cuff was whip stitched with a running locked suture of 0 Vicryl. A  McCall culdoplasty suture was performed at this time with a suture of 0  Vicryl. It was brought through the vaginal cuff at the 6 o'clock position,  up through the uterosacral ligament on the patient's left-hand side, across  the posterior cul-de-sac in a pursestring fashion, down to the uterosacral  ligament on the right-hand side and then out through the vagina at the 6  o'clock position. This was held until the end of the case at which time it  was tied for excellent elevation of the vaginal cuff.   The  anterior colporrhaphy was performed next. Clamps were used to mark the  midline of the anterior vaginal wall which was then injected with 0.5%  lidocaine with 1:200,000 of epinephrine. The vaginal mucosa was incised  vertically with the Metzenbaum scissors. The bladder and the endopelvic  fascia were dissected off the overlying vaginal mucosa bilaterally. This was  carried back to the level of the pubic rami bilaterally. Hemostasis was  created with monopolar cautery overlying the bladder.   The TVT suprapubic incisions were marked at this time with a marking pen 2  cm to the right and to left of the midline node. The incisions were created  sharply with a scalpel. The patient's catheter left to gravity drainage, the  TVT was performed in a top-down fashion. The needle was placed first on the  right-hand side through the suprapubic incision and then out through the vagina at the level of the mid urethra and lateral to it. The same procedure  that was performed on the right-hand side was then repeated on the left  again in a top-down fashion. The Foley catheter was removed and cystoscopy  was performed and the findings were as noted above. The bladder was then  drained of all cystoscopy fluid. The tension-free vaginal tape was connected  to the abdominal needle passers and the tape was then drawn up through the  suprapubic incisions. The plastic sheaths were separated from the sling  material and the sheaths were removed while Kelly clamp was placed between  the sling in the urethra vaginally. Excess sling material was then removed  suprapubically. The anterior colporrhaphy was performed by placing a series  of vertical mattress sutures initially with 2-0 Vicryl which transitioned to  0 Vicryl. Excess vaginal mucosa was then trimmed and the anterior vaginal  wall was closed with a running locked suture of 2-0 Vicryl. At the level of  the vaginal cuff this was changed to a running locked  suture of 0 Vicryl.   The posterior colporrhaphy was performed last. Allis clamps were used to  mark the midline of the vagina all the way up to the level of the  culdoplasty suture. The local injection of half percent lidocaine with  1:200,000 of epinephrine was used along the vaginal wall. A triangular wedge  of  epithelium was removed from the perineum and the posterior vaginal wall  was then incised vertically with the Mayo scissors. The perirectal fascia  was dissected off the overlying vaginal mucosa using a combination of sharp  and blunt dissection. The rectocele was then reduced by placing vertical  mattress sutures of a combination of 2-0 Vicryl and 0 Vicryl. Excess vaginal  mucosa was trimmed and the posterior vaginal wall was closed with a running  locked suture of 2-0 Vicryl. A crown stitch of 0 Vicryl was placed to  support the perineal body. A subcuticular closure along the perineum was  performed using 2-0 Vicryl as for an episiotomy and the knot was then tied  at the hymen.   The culdoplasty suture was tied at this time.   A pneumoperitoneum was achieved one final time and the surgical sites were  examined intraperitoneally. There was no evidence of any ongoing bleeding.  The pelvis and abdomen were irrigated and suctioned well. The lower  abdominal trocars were removed. The pneumoperitoneum was completely released  and the umbilical trocar and the laparoscope were removed simultaneously.  All of the skin incisions were closed with subcuticular sutures of 4-0  Vicryl including the two suprapubic incisions. Steri-Strips and bandages  were placed over all of these incisions.   A vaginal packing with Estrace cream was then placed inside the vagina.  Final rectal examination confirmed the rectum to be intact and with no  sutures in the rectum. The rectum itself was noted to be dilated with some stool present. This was thought to perhaps correlate with the patient's   history of significant constipation.   The patient was cleansed of any remaining Betadine. She was awakened and  extubated and escorted to the recovery room in stable and awake condition.  There were no complications to the procedure. All needle, instrument, sponge  counts were correct.      Randye Lobo, M.D.  Electronically Signed     BES/MEDQ  D:  05/10/2005  T:  05/11/2005  Job:  [id number]

## 2010-09-15 NOTE — H&P (Signed)
[name], [name]                   ACCOUNT NO.:  [id number]   MEDICAL RECORD NO.:  [id number]          PATIENT TYPE:  INP   LOCATION:  NA                            FACILITY:  [facility]   PHYSICIAN:  Randye Lobo, M.D.   DATE OF BIRTH:  1935/10/14   DATE OF ADMISSION:  05/10/2005  DATE OF DISCHARGE:                                HISTORY & PHYSICAL   CHIEF COMPLAINT:  Uterine prolapse.   HISTORY OF PRESENT ILLNESS:  The patient is a 75 year old para 2, Caucasian  female who was referred by Dr. [name] for evaluation of pelvic  organ prolapse and urinary urgency along with incomplete voiding and  defecatory dysfunction. The patient was known to have complete uterine  procidentia. The patient did undergo preoperative urodynamic testing with a  vaginal packing in place on February 13, 2005. At a maximum bladder capacity  of 429 cc, the patient did have evidence of some urinary incontinence with a  valsalva maneuver. The patient may have had an episode of stress induced  detrusor instability. Her uroflometry study documented an intermittent  voiding pattern which was attributed to her prolapse and some probable  obstruction that she experiences with her voiding.   The patient is interested in proceeding with surgical treatment of her  procidentia and she declines the use of a pessary. She is interested in also  proceeding with treatment for occult stress incontinence.   PAST OBSTETRIC AND GYNECOLOGIC HISTORY:  The patient is status post vaginal  delivery x2. She denies a history of any prior pelvic surgery. Her last Pap  smear was performed in June of 2006 and was within normal limits. Her last  mammogram was performed in July of 2006 and was within normal limits. The  patient is not taking any hormone replacement therapy.   PAST MEDICAL HISTORY:  1.  Mitral valve prolapse. The patient does take antibiotics for SBE      prevention.  2.  Irritable bowel syndrome.  3.   Sarcoidosis as a child.   PAST SURGICAL HISTORY:  1.  Status post appendectomy.  2.  Status post tonsillectomy.   MEDICATIONS:  Glucosamine.   ALLERGIES:  SULFA.   SOCIAL HISTORY:  The patient is married. [place] She denies the  use of tobacco. She drinks one alcohol containing beverage per day. She  denies a history of illicit drugs.   FAMILY HISTORY:  The patient has a brother with bladder cancer.   REVIEW OF SYSTEMS:  The patient does require perineal splinting on occasion  to have bowel movements.   PHYSICAL EXAMINATION:  VITAL SIGNS:  Height 5 feet, 6 inches. Weight 154  pounds. Blood pressure 122/70.  GENERAL:  The patient is a Caucasian female in no acute distress.  HEENT:  Normocephalic, atraumatic.  LUNGS:  Clear to auscultation bilaterally.  HEART:  S1, S2 with a regular rate and rhythm.  ABDOMEN:  Soft and nontender and without evidence of hepatosplenomegaly or  organomegaly.  PELVIC EXAM:  Normal external genitalia and urethra. The patient does have  obvious complete uterine prolapse. There are no vaginal ulcerations or  lesions appreciated of the cervix or vagina. The uterus is noted to be small  and nontender. No adnexal masses nor tenderness are appreciated.   IMPRESSION:  The patient is a 75 year old para 2 female with complete  uterovaginal prolapse and probable genuine stress incontinence.   PLAN:  The patient will undergo a laparoscopically assisted vaginal  hysterectomy in order to ensure that we are able to perform a bilateral  salpingo-oophorectomy as well. The patient will also have an anterior and  posterior colporrhaphy with a tension free vaginal tape and cystoscopy at  [facility] on May 10, 2005. Risks, benefits, and  alternatives have been discussed with the patient who wishes to proceed.   The patient will receive both ampicillin and gentamicin for mitral valve  prolapse for SBE prophylaxis.      Randye Lobo, M.D.  Electronically Signed     BES/MEDQ  D:  05/09/2005  T:  05/09/2005  Job:  [id number]   cc:   [name], M.D.  Fax: [phone]

## 2011-03-05 ENCOUNTER — Other Ambulatory Visit: Payer: Self-pay | Admitting: Dermatology

## 2011-03-06 ENCOUNTER — Encounter: Payer: Self-pay | Admitting: Surgery

## 2011-03-16 ENCOUNTER — Other Ambulatory Visit: Payer: Self-pay | Admitting: Internal Medicine

## 2011-03-16 DIAGNOSIS — Z1231 Encounter for screening mammogram for malignant neoplasm of breast: Secondary | ICD-10-CM

## 2011-03-20 ENCOUNTER — Encounter: Payer: Self-pay | Admitting: Surgery

## 2011-03-26 ENCOUNTER — Encounter: Payer: Self-pay | Admitting: Surgery

## 2011-03-26 DIAGNOSIS — E785 Hyperlipidemia, unspecified: Secondary | ICD-10-CM

## 2011-03-26 DIAGNOSIS — T148XXA Other injury of unspecified body region, initial encounter: Secondary | ICD-10-CM | POA: Insufficient documentation

## 2011-03-28 ENCOUNTER — Other Ambulatory Visit: Payer: Self-pay | Admitting: Surgery

## 2011-03-28 DIAGNOSIS — D381 Neoplasm of uncertain behavior of trachea, bronchus and lung: Secondary | ICD-10-CM

## 2011-04-03 ENCOUNTER — Ambulatory Visit: Payer: Medicare Other | Admitting: Surgery

## 2011-04-03 ENCOUNTER — Ambulatory Visit
Admission: RE | Admit: 2011-04-03 | Discharge: 2011-04-03 | Disposition: A | Discharge status: Home / Self Care | Payer: Medicare Other | Source: Ambulatory Visit | Attending: Surgery | Admitting: Surgery

## 2011-04-03 DIAGNOSIS — D381 Neoplasm of uncertain behavior of trachea, bronchus and lung: Secondary | ICD-10-CM

## 2011-04-09 DIAGNOSIS — D190 Benign neoplasm of mesothelial tissue of pleura: Secondary | ICD-10-CM | POA: Insufficient documentation

## 2011-04-10 ENCOUNTER — Encounter: Payer: Self-pay | Admitting: Surgery

## 2011-04-10 ENCOUNTER — Ambulatory Visit (INDEPENDENT_AMBULATORY_CARE_PROVIDER_SITE_OTHER): Payer: Medicare Other | Admitting: Surgery

## 2011-04-10 VITALS — BP 116/64 | HR 70 | Resp 16 | Ht 67.0 in | Wt 150.0 lb

## 2011-04-10 DIAGNOSIS — Z86018 Personal history of other benign neoplasm: Secondary | ICD-10-CM

## 2011-04-10 DIAGNOSIS — Z8709 Personal history of other diseases of the respiratory system: Secondary | ICD-10-CM

## 2011-04-10 NOTE — Progress Notes (Signed)
                   [address]            [place] [address]          [phone]       HPI:  The patient returns to my office today for followup status post left thoracotomy and wedge resection of a left lower lobe lung mass on 12/03/2005. 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 Since I saw her on 03/28/2010 she has continued to do well. She denies any chest pain. She's had no shortness of breath. She denies any cough or hemoptysis.  Current Outpatient Prescriptions  Medication Sig Dispense Refill  . aspirin 81 MG tablet Take 81 mg by mouth daily.        . calcium-vitamin D 250-100 MG-UNIT per tablet Take 1 tablet by mouth 2 (two) times daily.        Jennette Banker Sodium 30-100 MG CAPS Take by mouth.        . dicyclomine (BENTYL) 10 MG capsule Take 10 mg by mouth 3 (three) times daily as needed.        . diphenhydrAMINE (SOMINEX) 25 MG tablet Take 25 mg by mouth at bedtime as needed.        . fish oil-omega-3 fatty acids 1000 MG capsule Take 1,200 g by mouth daily.       Marland Kitchen glucosamine-chondroitin 500-400 MG tablet Take 1 tablet by mouth 2 (two) times daily.       . magnesium 30 MG tablet Take 30 mg by mouth once as needed.       . Probiotic Product (ALIGN PO) Take by mouth.           Physical Exam: BP 116/64  Pulse 70  Resp 16  Ht 5\' 7"  (1.702 m)  Wt 150 lb (68.04 kg)  BMI 23.49 kg/m2  SpO2 98% She looks well. Lungs are clear. The left thoracotomy scar looks normal.  Diagnostic Tests:  *RADIOLOGY REPORT*   Clinical Data: History of lung surgery in 2007 for benign process, follow-up   CHEST - 2 VIEW   Comparison: Chest x-ray of 03/28/2010   Findings: No active infiltrate or effusion is seen.  Surgery clips overlie the right hilum.  Mediastinal contours are stable.  The heart is within normal limits in size.  Mild thoracic spine curvature is noted.   IMPRESSION: Stable chest x-ray.  No active lung disease.     Original Report Authenticated By: Juline Patch, M.D.     Imaging      Impression:  Mrs. Ackert continues to do well following resection of solitary fibrous tumor of the pleura.  Plan: I will plan to see her back in one year with a repeat chest x-ray.

## 2011-05-14 ENCOUNTER — Ambulatory Visit
Admission: RE | Admit: 2011-05-14 | Discharge: 2011-05-14 | Disposition: A | Discharge status: Home / Self Care | Payer: Medicare Other | Source: Ambulatory Visit | Attending: Internal Medicine | Admitting: Internal Medicine

## 2011-05-14 DIAGNOSIS — Z1231 Encounter for screening mammogram for malignant neoplasm of breast: Secondary | ICD-10-CM

## 2011-07-02 ENCOUNTER — Telehealth: Payer: Self-pay | Admitting: Internal Medicine

## 2011-07-02 DIAGNOSIS — R002 Palpitations: Secondary | ICD-10-CM

## 2011-07-02 DIAGNOSIS — R42 Dizziness and giddiness: Secondary | ICD-10-CM

## 2011-07-02 NOTE — Telephone Encounter (Signed)
Patient called because she said  has been having episodes of dizzy, also like fainting spells, one last Thursday, another Friday and some SOB . According Dr. Graciela Husbands o/V note on 02/26/2006" Patient returns following  a RF catheter ablation for AV nodal physiology and Adenosis responsive tachycardia" Patient last office visit was on 12/24/2007. Patient states would like to see Dr. Graciela Husbands soon for her symptoms. She does not want to start with another doctor. Patient is aware that Dr. Graciela Husbands is not in the office today. This message will be send to Dr. Graciela Husbands and his nurse for recommendations.

## 2011-07-02 NOTE — Telephone Encounter (Signed)
Patient would like to be seen ASAP hm# [phone]  Patient experiencing SOB, fainting spells, heart is out rhythm.  Patient wants to be seen as soon as possible.  Please return call to hm# [phone]

## 2011-07-03 NOTE — Telephone Encounter (Signed)
Fu call °Patient returning your call °

## 2011-07-03 NOTE — Telephone Encounter (Signed)
I spoke with the patient earlier today. She reports episodes of dizziness and pre-syncope since last Thursday. She has noted feelings of her heart being out of rhythm. I offered an appointment with Dr. Graciela Husbands on 3/25 at 3:30 pm. She wanted to know if she could wear a heart monitor in the interim. Reviewed with Dr. Graciela Husbands and he is ok with this. The patient is aware I will order this and we will be in touch with her about having her come in for her monitor.

## 2011-07-03 NOTE — Telephone Encounter (Signed)
LMTC

## 2011-07-04 ENCOUNTER — Encounter (INDEPENDENT_AMBULATORY_CARE_PROVIDER_SITE_OTHER): Payer: Medicare Other

## 2011-07-04 DIAGNOSIS — R002 Palpitations: Secondary | ICD-10-CM

## 2011-07-23 ENCOUNTER — Ambulatory Visit (INDEPENDENT_AMBULATORY_CARE_PROVIDER_SITE_OTHER): Payer: Medicare Other | Admitting: Internal Medicine

## 2011-07-23 ENCOUNTER — Encounter: Payer: Self-pay | Admitting: Internal Medicine

## 2011-07-23 VITALS — BP 121/72 | HR 70 | Ht 67.0 in | Wt 159.0 lb

## 2011-07-23 DIAGNOSIS — I4891 Unspecified atrial fibrillation: Secondary | ICD-10-CM | POA: Diagnosis not present

## 2011-07-23 DIAGNOSIS — R42 Dizziness and giddiness: Secondary | ICD-10-CM | POA: Diagnosis not present

## 2011-07-23 DIAGNOSIS — R0609 Other forms of dyspnea: Secondary | ICD-10-CM | POA: Diagnosis not present

## 2011-07-23 DIAGNOSIS — R0989 Other specified symptoms and signs involving the circulatory and respiratory systems: Secondary | ICD-10-CM

## 2011-07-23 MED ORDER — METOPROLOL SUCCINATE ER 50 MG PO TB24: 50.0000 mg | ORAL_TABLET | Freq: Every day | ORAL | Status: DC

## 2011-07-23 MED ORDER — APIXABAN 5 MG PO TABS: 5.0000 mg | ORAL_TABLET | Freq: Two times a day (BID) | ORAL | Status: DC

## 2011-07-23 MED ORDER — PROPRANOLOL HCL ER 80 MG PO CP24: 80.0000 mg | ORAL_CAPSULE | Freq: Every day | ORAL | Status: DC

## 2011-07-23 MED ORDER — METOPROLOL TARTRATE 50 MG PO TABS: 50.0000 mg | ORAL_TABLET | Freq: Two times a day (BID) | ORAL | Status: DC

## 2011-07-23 MED ORDER — ATENOLOL 50 MG PO TABS: 50.0000 mg | ORAL_TABLET | Freq: Every day | ORAL | Status: DC

## 2011-07-23 NOTE — Progress Notes (Signed)
  HPI  [name] is a 76 y.o. female Seen after an interval of   4 years. Remotely she has had a slow pathway modification for AV node reentry and with post ablation palpitations was identified as having atrial tachycardia arrhythmia. She was treated medically and I have not seen her  In 4 yezrs She comes in today with complaint of over the last 4 weeks or so having profound dizziness exercise intolerance occasionally associated with palpitations. There has been no accompanying peripheral edema orthopnea has not been an issue.  She has a complaint also of a discomfort in her chest which is not reproducibly associated with exertion but for which she uses the [place] sign.   There has been a great deal of stress in her family and friends feeling like she is going almost to a funeral every week  1 she notes also spells of abrupt onset lightheadedness typically resolving over 5-10 minutes and associated with fatigue  Past Medical History  Diagnosis Date  . Hyperlipidemia   . Fracture     at l1  . Fracture     right arm x 2  . Tachyarrhythmia   . Benign tumor of pleura     Past Surgical History  Procedure Date  . Abdominal hysterectomy   . Left video-assisted thoracoscopy, left thoracotomy with wedge resection of left lower lobe lung mass.  insertion of on-q pain pump 12/03/2005    [facility]    Current Outpatient Prescriptions  Medication Sig Dispense Refill  . aspirin 81 MG tablet Take 81 mg by mouth daily.        . calcium-vitamin D 250-100 MG-UNIT per tablet Take 1 tablet by mouth 2 (two) times daily.        Jennette Banker Sodium 30-100 MG CAPS Take by mouth.        . dicyclomine (BENTYL) 10 MG capsule Take 10 mg by mouth 3 (three) times daily as needed.        . diphenhydrAMINE (SOMINEX) 25 MG tablet Take 25 mg by mouth at bedtime as needed.        . fish oil-omega-3 fatty acids 1000 MG capsule Take 1,200 g by mouth daily.       Marland Kitchen glucosamine-chondroitin 500-400 MG tablet  Take 1 tablet by mouth 2 (two) times daily.       . magnesium 30 MG tablet Take 30 mg by mouth once as needed.       . Probiotic Product (ALIGN PO) Take by mouth.          No Known Allergies  Review of Systems negative except from HPI and PMH  Physical Exam BP 121/72  Pulse 70  Ht 5\' 7"  (1.702 m)  Wt 159 lb (72.122 kg)  BMI 24.90 kg/m2 Well developed and well nourished in no acute distress HENT normal E scleral and icterus clear Neck Supple JVP 9-10 cm; carotids brisk and full Clear to ausculation regular rate and rhythm no murmurs gallops or rub Soft with active bowel sounds No clubbing cyanosis none Edema Alert and oriented, grossly normal motor and sensory function Skin Warm and Dry  Electrocardiogram demonstrates sinus rhythm at 69 with occasional PACs intervals.20 0/07/42  Assessment and  Plan

## 2011-07-23 NOTE — Assessment & Plan Note (Signed)
Her dyspnea on exertion has been progressive over the last year or 2. She also describes a discomfort in her chest and uses the LEVINE sign increasing the likelihood of coronary disease. Assessment of left ventricular function will have implications both on treatment strategies for her atrial fibrillation, coronary artery disease as relates to antiarrhythmic drug choice and so we'll undertake a stress echo looking for structural abnormalities of the heart as well as perfusion.

## 2011-07-23 NOTE — Assessment & Plan Note (Signed)
The patient has episodes of lightheadedness. This is separate from the quite disturbing dizziness that has been rather generalized over the last couple of weeks. The latter appears to correlate with rapid atrial fibrillation. The mechanism of the former is not so clear. We do not see evidence of posttermination pausing. A lengthy duration of the events suggests that they are neurally mediated.

## 2011-07-23 NOTE — Patient Instructions (Signed)
Your physician has recommended you make the following change in your medication:  - You have been given prescriptions for 4 different beta blockers to try in 2 week intervals. You may take these in any order, but DO NOT take more than one at a time.  1) atenolol 50 mg one tablet by mouth daily  2) metoprolol succ 50 mg one tablet by mouth daily 3) metoprolol tart 50 mg one tablet by mouth twice daily 4) inderal LA 80 mg one capsule by mouth daily - you are also being given a prescription for : 5) Eliquis 5 mg twice daily- please have your labs drawn at Dr. Ferd Hibbs office next week prior to starting this medication.  6) you will stop aspirin when you start Eliquis.  Your physician has requested that you have a stress echocardiogram. For further information please visit [email or web address] Please follow instruction sheet as given.  Your physician recommends that you schedule a follow-up appointment in: 2 months with Dr. Graciela Husbands

## 2011-07-23 NOTE — Assessment & Plan Note (Signed)
Episodes of rapid atrial fibrillation occur frequently in her monitors sometimes they are flutter-like other times there is no discernible P-wave activity with a rapid irregular rate. Becoming short bursts as well as long bursts and are associated with a rapid ventricular rate.  We had a lengthy discussion regarding treatment strategies for atrial fibrillation. We discussed the need for anticoagulation given her CHADS-VASc score of 2(age-63, gender-1) and the relative merits of warfarin and the alternatives. She would like to take  apixoban  Blood work will be drawn at Dr. Ferd Hibbs office next Monday. We will await to see what the thyroid function testing, all electrolytes CBC and renal function are prior to initiating of anticoagulation.  We also discussed the issue of rate versus rhythm control. Initial strategies will be directed at rate control. We discussed the side effects of beta blockers I have given her prescriptions for atenolol, metoprolol succinate and tartrate, and Inderal. We will see which she tolerates the best and hopefully we can accomplish significant symptom reduction with rate control. In the event that this is unsuccessful we will pursue rhythm control. Discussed briefly antiarrhythmic drug therapy as well as catheter ablation.

## 2011-07-30 ENCOUNTER — Encounter: Payer: Self-pay | Admitting: Internal Medicine

## 2011-07-30 DIAGNOSIS — E785 Hyperlipidemia, unspecified: Secondary | ICD-10-CM | POA: Diagnosis not present

## 2011-07-30 DIAGNOSIS — M899 Disorder of bone, unspecified: Secondary | ICD-10-CM | POA: Diagnosis not present

## 2011-07-30 DIAGNOSIS — Z0000 Encounter for general adult medical examination without abnormal findings: Secondary | ICD-10-CM | POA: Diagnosis not present

## 2011-07-31 ENCOUNTER — Telehealth: Payer: Self-pay | Admitting: *Deleted

## 2011-07-31 NOTE — Telephone Encounter (Signed)
I left a message at Dr. Ferd Hibbs office to please fax the patient's most recent lab results from yesterday to Dr. Graciela Husbands. Pending these results the patient will start on Eliquis.

## 2011-08-02 NOTE — Telephone Encounter (Signed)
I left a message for the patient to call. Just wanted to let her know labs were received from Dr. Ferd Hibbs office and her CBC & BMP were normal. She may start Eliquis as prescribed if she has not already done so.

## 2011-08-06 DIAGNOSIS — R0989 Other specified symptoms and signs involving the circulatory and respiratory systems: Secondary | ICD-10-CM | POA: Diagnosis not present

## 2011-08-06 DIAGNOSIS — M25559 Pain in unspecified hip: Secondary | ICD-10-CM | POA: Diagnosis not present

## 2011-08-06 DIAGNOSIS — R0609 Other forms of dyspnea: Secondary | ICD-10-CM | POA: Diagnosis not present

## 2011-08-06 DIAGNOSIS — I4891 Unspecified atrial fibrillation: Secondary | ICD-10-CM | POA: Diagnosis not present

## 2011-08-06 DIAGNOSIS — Z0000 Encounter for general adult medical examination without abnormal findings: Secondary | ICD-10-CM | POA: Diagnosis not present

## 2011-08-07 NOTE — Telephone Encounter (Signed)
I spoke with the patient. She states she has started Eliquis as of yesterday. She saw Dr. Timothy Lasso and he gave her her lab results.

## 2011-08-09 ENCOUNTER — Ambulatory Visit (HOSPITAL_COMMUNITY): Payer: Medicare Other | Attending: Cardiology

## 2011-08-09 ENCOUNTER — Encounter: Payer: Self-pay | Admitting: Internal Medicine

## 2011-08-09 DIAGNOSIS — R0609 Other forms of dyspnea: Secondary | ICD-10-CM | POA: Insufficient documentation

## 2011-08-09 DIAGNOSIS — R072 Precordial pain: Secondary | ICD-10-CM | POA: Insufficient documentation

## 2011-08-09 DIAGNOSIS — R5383 Other fatigue: Secondary | ICD-10-CM | POA: Insufficient documentation

## 2011-08-09 DIAGNOSIS — R002 Palpitations: Secondary | ICD-10-CM | POA: Insufficient documentation

## 2011-08-09 DIAGNOSIS — Z8249 Family history of ischemic heart disease and other diseases of the circulatory system: Secondary | ICD-10-CM | POA: Diagnosis not present

## 2011-08-09 DIAGNOSIS — I4891 Unspecified atrial fibrillation: Secondary | ICD-10-CM

## 2011-08-09 DIAGNOSIS — Z87891 Personal history of nicotine dependence: Secondary | ICD-10-CM | POA: Insufficient documentation

## 2011-08-09 DIAGNOSIS — R5381 Other malaise: Secondary | ICD-10-CM | POA: Diagnosis not present

## 2011-08-09 DIAGNOSIS — R42 Dizziness and giddiness: Secondary | ICD-10-CM | POA: Insufficient documentation

## 2011-08-09 DIAGNOSIS — R0602 Shortness of breath: Secondary | ICD-10-CM

## 2011-08-09 DIAGNOSIS — R0989 Other specified symptoms and signs involving the circulatory and respiratory systems: Secondary | ICD-10-CM | POA: Insufficient documentation

## 2011-08-09 DIAGNOSIS — E785 Hyperlipidemia, unspecified: Secondary | ICD-10-CM | POA: Diagnosis not present

## 2011-08-17 ENCOUNTER — Other Ambulatory Visit: Payer: Self-pay | Admitting: Internal Medicine

## 2011-08-22 ENCOUNTER — Telehealth: Payer: Self-pay | Admitting: Internal Medicine

## 2011-08-22 NOTE — Telephone Encounter (Signed)
I spoke with the patient and made her aware I have not tried to call her today. She thinks this was from an old message.

## 2011-08-22 NOTE — Telephone Encounter (Signed)
New Problem:     Patient returned your phone call.  Please call back. 

## 2011-09-03 DIAGNOSIS — L821 Other seborrheic keratosis: Secondary | ICD-10-CM | POA: Diagnosis not present

## 2011-09-03 DIAGNOSIS — D239 Other benign neoplasm of skin, unspecified: Secondary | ICD-10-CM | POA: Diagnosis not present

## 2011-09-03 DIAGNOSIS — L819 Disorder of pigmentation, unspecified: Secondary | ICD-10-CM | POA: Diagnosis not present

## 2011-09-03 DIAGNOSIS — L820 Inflamed seborrheic keratosis: Secondary | ICD-10-CM | POA: Diagnosis not present

## 2011-09-20 ENCOUNTER — Other Ambulatory Visit: Payer: Self-pay | Admitting: Internal Medicine

## 2011-09-20 NOTE — Telephone Encounter (Signed)
..   Requested Prescriptions   Signed Prescriptions Disp Refills  . atenolol (TENORMIN) 50 MG tablet 30 tablet 6    Sig: TAKE ONE TABLET BY MOUTH ONCE DAILY    Authorizing Provider: Duke Salvia    Ordering User: Lacie Scotts

## 2011-09-25 ENCOUNTER — Encounter: Payer: Self-pay | Admitting: Internal Medicine

## 2011-09-25 ENCOUNTER — Ambulatory Visit (INDEPENDENT_AMBULATORY_CARE_PROVIDER_SITE_OTHER): Payer: Medicare Other | Admitting: Internal Medicine

## 2011-09-25 VITALS — BP 108/68 | HR 69 | Ht 67.0 in | Wt 158.8 lb

## 2011-09-25 DIAGNOSIS — R42 Dizziness and giddiness: Secondary | ICD-10-CM | POA: Diagnosis not present

## 2011-09-25 DIAGNOSIS — I4891 Unspecified atrial fibrillation: Secondary | ICD-10-CM

## 2011-09-25 DIAGNOSIS — R0609 Other forms of dyspnea: Secondary | ICD-10-CM | POA: Diagnosis not present

## 2011-09-25 DIAGNOSIS — R0989 Other specified symptoms and signs involving the circulatory and respiratory systems: Secondary | ICD-10-CM

## 2011-09-25 NOTE — Assessment & Plan Note (Signed)
Much improved

## 2011-09-25 NOTE — Assessment & Plan Note (Signed)
Largely resolve

## 2011-09-25 NOTE — Progress Notes (Signed)
  HPI  [name] is a 76 y.o. female  Seen in followup for exercise associated dizziness and palpitations. She is also having chest pain at that time. Stress echo demonstrated no echocardiographic changes images were poor; left ventricular function was normal.  CBC and TSH were normal   This occurred in the setting of significant psycho social stress  There was recorder formation consistent with atrial fibrillation. We have discussed rhythm versus rate control as well as anticoagulation. She was started on  apixoban as well as gave her a prescription for 4 different beta blockers.  She took only the atenolol. The only  side effect she has noted is a little bit of stiffness in the morning. Her palpitations are much improved and there is no further lightheadedness  Past Medical History  Diagnosis Date  . Hyperlipidemia   . Fracture     at l1  . Fracture     right arm x 2  . Tachyarrhythmia   . Benign tumor of pleura     Past Surgical History  Procedure Date  . Abdominal hysterectomy   . Left video-assisted thoracoscopy, left thoracotomy with wedge resection of left lower lobe lung mass.  insertion of on-q pain pump 12/03/2005    [facility]    Current Outpatient Prescriptions  Medication Sig Dispense Refill  . Apixaban (ELIQUIS) 5 MG TABS Take 5 mg by mouth 2 (two) times daily.  60 tablet  6  . atenolol (TENORMIN) 50 MG tablet TAKE ONE TABLET BY MOUTH ONCE DAILY  30 tablet  6  . calcium-vitamin D 250-100 MG-UNIT per tablet Take 1 tablet by mouth 2 (two) times daily.        Jennette Banker Sodium 30-100 MG CAPS Take by mouth.        . dicyclomine (BENTYL) 10 MG capsule Take 10 mg by mouth 3 (three) times daily as needed.        . diphenhydrAMINE (SOMINEX) 25 MG tablet Take 25 mg by mouth at bedtime as needed.        . fish oil-omega-3 fatty acids 1000 MG capsule Take 1,200 g by mouth daily.       . Flaxseed, Linseed, (FLAX SEEDS PO) Take 1 capsule by mouth daily.      Marland Kitchen  glucosamine-chondroitin 500-400 MG tablet Take 1 tablet by mouth 2 (two) times daily.       . magnesium 30 MG tablet Take 30 mg by mouth once as needed.       . Probiotic Product (ALIGN PO) Take by mouth as needed.         No Known Allergies  Review of Systems negative except from HPI and PMH  Physical Exam BP 108/68  Pulse 69  Ht 5\' 7"  (1.702 m)  Wt 158 lb 12.8 oz (72.031 kg)  BMI 24.87 kg/m2 Well developed and well nourished in no acute distress HENT normal E scleral and icterus clear Neck Supple JVP flat; carotids brisk and full Clear to ausculation Regular rate and rhythm, no murmurs gallops or rub Soft with active bowel sounds No clubbing cyanosis none Edema Alert and oriented, grossly normal motor and sensory function Skin Warm and Dry  Echocardiogram demonstrates sinus rhythm at 70 Intervals 18/07/41 Assessment and  Plan

## 2011-09-25 NOTE — Assessment & Plan Note (Signed)
Symptoms largely controlled presently. She's taking anticoagulation and atenolol; will consider replacement trial to see if the stiffness is related to the atenolol

## 2011-09-25 NOTE — Patient Instructions (Signed)
Your physician wants you to follow-up in: 6 months with Dr. Klein. You will receive a reminder letter in the mail two months in advance. If you don't receive a letter, please call our office to schedule the follow-up appointment.  Your physician recommends that you continue on your current medications as directed. Please refer to the Current Medication list given to you today.  

## 2011-10-29 ENCOUNTER — Other Ambulatory Visit: Payer: Self-pay | Admitting: Internal Medicine

## 2011-10-30 ENCOUNTER — Telehealth: Payer: Self-pay | Admitting: Internal Medicine

## 2011-10-30 DIAGNOSIS — R300 Dysuria: Secondary | ICD-10-CM | POA: Diagnosis not present

## 2011-10-30 DIAGNOSIS — R82998 Other abnormal findings in urine: Secondary | ICD-10-CM | POA: Diagnosis not present

## 2011-10-30 MED ORDER — METOPROLOL SUCCINATE ER 50 MG PO TB24: 50.0000 mg | ORAL_TABLET | Freq: Every day | ORAL | Status: DC

## 2011-10-30 NOTE — Telephone Encounter (Signed)
New msg Pt said she has been taking metoprolol succinate 50 mg and it has been doing well for her. Please call to [facility] [phone]

## 2011-10-30 NOTE — Telephone Encounter (Signed)
I spoke with the patient to verify if she wants # 30 or # 90 day supply sent in. She would like # 30 days to start with. I explained I will do this with refills for her. She will call back should she continue to do well on this medication and decides she would like a # 90 day supply sent in.

## 2012-01-21 DIAGNOSIS — R300 Dysuria: Secondary | ICD-10-CM | POA: Diagnosis not present

## 2012-01-21 DIAGNOSIS — R82998 Other abnormal findings in urine: Secondary | ICD-10-CM | POA: Diagnosis not present

## 2012-02-06 DIAGNOSIS — Z23 Encounter for immunization: Secondary | ICD-10-CM | POA: Diagnosis not present

## 2012-02-28 DIAGNOSIS — H40019 Open angle with borderline findings, low risk, unspecified eye: Secondary | ICD-10-CM | POA: Diagnosis not present

## 2012-02-28 DIAGNOSIS — H2510 Age-related nuclear cataract, unspecified eye: Secondary | ICD-10-CM | POA: Diagnosis not present

## 2012-02-28 DIAGNOSIS — H04129 Dry eye syndrome of unspecified lacrimal gland: Secondary | ICD-10-CM | POA: Diagnosis not present

## 2012-02-28 DIAGNOSIS — H43399 Other vitreous opacities, unspecified eye: Secondary | ICD-10-CM | POA: Diagnosis not present

## 2012-02-28 DIAGNOSIS — D3130 Benign neoplasm of unspecified choroid: Secondary | ICD-10-CM | POA: Diagnosis not present

## 2012-03-04 ENCOUNTER — Other Ambulatory Visit: Payer: Self-pay | Admitting: Internal Medicine

## 2012-04-02 ENCOUNTER — Ambulatory Visit (INDEPENDENT_AMBULATORY_CARE_PROVIDER_SITE_OTHER): Payer: Medicare Other | Admitting: Internal Medicine

## 2012-04-02 ENCOUNTER — Encounter: Payer: Self-pay | Admitting: Internal Medicine

## 2012-04-02 VITALS — BP 105/65 | HR 65 | Resp 18 | Ht 67.0 in | Wt 161.1 lb

## 2012-04-02 DIAGNOSIS — IMO0001 Reserved for inherently not codable concepts without codable children: Secondary | ICD-10-CM | POA: Diagnosis not present

## 2012-04-02 DIAGNOSIS — M629 Disorder of muscle, unspecified: Secondary | ICD-10-CM

## 2012-04-02 DIAGNOSIS — R42 Dizziness and giddiness: Secondary | ICD-10-CM

## 2012-04-02 DIAGNOSIS — I4891 Unspecified atrial fibrillation: Secondary | ICD-10-CM | POA: Diagnosis not present

## 2012-04-02 DIAGNOSIS — M791 Myalgia, unspecified site: Secondary | ICD-10-CM

## 2012-04-02 DIAGNOSIS — M6289 Other specified disorders of muscle: Secondary | ICD-10-CM

## 2012-04-02 NOTE — Assessment & Plan Note (Signed)
Symptomatic control

## 2012-04-02 NOTE — Assessment & Plan Note (Signed)
Better continue current meds

## 2012-04-02 NOTE — Progress Notes (Signed)
  HPI  [name] is a 76 y.o. female  Seen in followup for exercise associated dizziness and palpitations. She is also having chest pain at that time. Stress echo demonstrated no echocardiographic changes; images were poor.  Left ventricular function was normal.  CBC and TSH were normal   These occurred in the setting of significant psycho social stress  Event recorder>>atrial fibrillation. We have discussed rhythm versus rate control as well as anticoagulation. She was started on  apixoban as well as gave her a prescription for 4 different beta blockers.  She took only the atenolol.  Her palpitations are much improved and there is no further lightheadedness.  There was some stiffness that we wondereed whether was related to atenolol  It didn't iprove  There is proximal muscle stiffnhes   Past Medical History  Diagnosis Date  . Hyperlipidemia   . Fracture     at l1  . Fracture     right arm x 2  . Tachyarrhythmia   . Benign tumor of pleura     Past Surgical History  Procedure Date  . Abdominal hysterectomy   . Left video-assisted thoracoscopy, left thoracotomy with wedge resection of left lower lobe lung mass.  insertion of on-q pain pump 12/03/2005    [facility]    Current Outpatient Prescriptions  Medication Sig Dispense Refill  . Casanthranol-Docusate Sodium 30-100 MG CAPS Take by mouth.        . dicyclomine (BENTYL) 10 MG capsule Take 10 mg by mouth 3 (three) times daily as needed.        Marland Kitchen ELIQUIS 5 MG TABS tablet TAKE ONE TABLET TWICE DAILY  60 tablet  12  . fish oil-omega-3 fatty acids 1000 MG capsule Take 1,200 g by mouth daily.       Marland Kitchen glucosamine-chondroitin 500-400 MG tablet Take 1 tablet by mouth 2 (two) times daily.       . magnesium 30 MG tablet Take 30 mg by mouth once as needed.       . metoprolol succinate (TOPROL-XL) 50 MG 24 hr tablet Take 1 tablet (50 mg total) by mouth daily. Take with or immediately following a meal.  30 tablet  11  . calcium-vitamin D 250-100  MG-UNIT per tablet Take 1 tablet by mouth 2 (two) times daily.        . diphenhydrAMINE (SOMINEX) 25 MG tablet Take 25 mg by mouth at bedtime as needed.        . Flaxseed, Linseed, (FLAX SEEDS PO) Take 1 capsule by mouth daily.      . Probiotic Product (ALIGN PO) Take by mouth as needed.         No Known Allergies  Review of Systems negative except from HPI and PMH  Physical Exam BP 105/65  Pulse 65  Resp 18  Ht 5\' 7"  (1.702 m)  Wt 161 lb 1.9 oz (73.084 kg)  BMI 25.24 kg/m2 Well developed and well nourished in no acute distress HENT normal E scleral and icterus clear Neck Supple JVP flat; carotids brisk and full Clear to ausculation Regular rate and rhythm, no murmurs gallops or rub Soft with active bowel sounds No clubbing cyanosis none Edema Alert and oriented, grossly normal motor and sensory function Skin Warm and Dry  Electrocardioagram demonstrates sinus rhythm at  a   sinus at 66 Intervals 19/07/42 Poor R-wave progression Low voltage  Assessment and  Plan

## 2012-04-02 NOTE — Assessment & Plan Note (Signed)
Will do ESR to r/o PMR

## 2012-04-02 NOTE — Patient Instructions (Signed)
LABS TODAY:  ESR  Your physician wants you to follow-up in: 6 months with Dr. Graciela Husbands. You will receive a reminder letter in the mail two months in advance. If you don't receive a letter, please call our office to schedule the follow-up appointment.

## 2012-04-09 DIAGNOSIS — H40019 Open angle with borderline findings, low risk, unspecified eye: Secondary | ICD-10-CM | POA: Diagnosis not present

## 2012-04-09 DIAGNOSIS — H04209 Unspecified epiphora, unspecified lacrimal gland: Secondary | ICD-10-CM | POA: Diagnosis not present

## 2012-04-10 ENCOUNTER — Other Ambulatory Visit: Payer: Self-pay | Admitting: *Deleted

## 2012-04-10 DIAGNOSIS — Z86018 Personal history of other benign neoplasm: Secondary | ICD-10-CM

## 2012-04-10 DIAGNOSIS — Z8709 Personal history of other diseases of the respiratory system: Secondary | ICD-10-CM

## 2012-04-15 ENCOUNTER — Ambulatory Visit (INDEPENDENT_AMBULATORY_CARE_PROVIDER_SITE_OTHER): Payer: Medicare Other | Admitting: Surgery

## 2012-04-15 ENCOUNTER — Ambulatory Visit
Admission: RE | Admit: 2012-04-15 | Discharge: 2012-04-15 | Disposition: A | Discharge status: Home / Self Care | Payer: Medicare Other | Source: Ambulatory Visit | Attending: Surgery | Admitting: Surgery

## 2012-04-15 ENCOUNTER — Encounter: Payer: Self-pay | Admitting: Surgery

## 2012-04-15 ENCOUNTER — Other Ambulatory Visit: Payer: Self-pay | Admitting: Internal Medicine

## 2012-04-15 VITALS — BP 122/66 | HR 72 | Resp 18 | Ht 67.0 in | Wt 161.0 lb

## 2012-04-15 DIAGNOSIS — Z1231 Encounter for screening mammogram for malignant neoplasm of breast: Secondary | ICD-10-CM

## 2012-04-15 DIAGNOSIS — Z8709 Personal history of other diseases of the respiratory system: Secondary | ICD-10-CM

## 2012-04-15 DIAGNOSIS — D491 Neoplasm of unspecified behavior of respiratory system: Secondary | ICD-10-CM | POA: Diagnosis not present

## 2012-04-15 DIAGNOSIS — Z86018 Personal history of other benign neoplasm: Secondary | ICD-10-CM

## 2012-04-16 ENCOUNTER — Encounter: Payer: Self-pay | Admitting: Surgery

## 2012-04-16 NOTE — Progress Notes (Signed)
                    [address]            [place] [address]          [phone]     HPI:  The patient returns to my office today for followup status post left thoracotomy and wedge resection of a left lower lobe lung mass on 12/03/2005. 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 Since I saw her about 1 year ago she has continued to do well. She denies any chest pain. She's had no shortness of breath. She denies any cough or hemoptysis.   Current Outpatient Prescriptions  Medication Sig Dispense Refill  . dicyclomine (BENTYL) 10 MG capsule Take 10 mg by mouth 3 (three) times daily as needed.       . diphenhydrAMINE (SOMINEX) 25 MG tablet Take 25 mg by mouth at bedtime as needed.       Marland Kitchen ELIQUIS 5 MG TABS tablet TAKE ONE TABLET TWICE DAILY  60 tablet  12  . fish oil-omega-3 fatty acids 1000 MG capsule Take 1,200 g by mouth daily.       Marland Kitchen glucosamine-chondroitin 500-400 MG tablet Take 1 tablet by mouth 2 (two) times daily.       . magnesium 30 MG tablet Take 30 mg by mouth once as needed.       . metoprolol succinate (TOPROL-XL) 50 MG 24 hr tablet Take 1 tablet (50 mg total) by mouth daily. Take with or immediately following a meal.  30 tablet  11  . Probiotic Product (ALIGN PO) Take by mouth as needed.          Physical Exam:  BP 122/66  Pulse 72  Resp 18  Ht 5\' 7"  (1.702 m)  Wt 161 lb (73.029 kg)  BMI 25.22 kg/m2  SpO2 100% She looks well. There is no cervical or supraclavicular adenopathy. Lung exam is clear. The left chest scar is unremarkable.  Diagnostic Tests:  *RADIOLOGY REPORT*   Clinical Data: Follow up pleural tumor   CHEST - 2 VIEW   Comparison: 04/03/2011 and 03/28/2010   Findings: Cardiomediastinal silhouette is stable.  No acute infiltrate or pleural effusion.  No pulmonary edema.  Stable mild degenerative changes thoracic spine. Mild hyperinflation again noted.   IMPRESSION: No active disease.  No significant  change.     Original Report Authenticated By: Natasha Mead, M.D.   Impression:  She continues to do well following resection of a solitary fibrous tumor of the pleura in August of 2007.  Plan:  I'll see her back in one year with a repeat chest x-ray.

## 2012-06-05 ENCOUNTER — Ambulatory Visit
Admission: RE | Admit: 2012-06-05 | Discharge: 2012-06-05 | Disposition: A | Discharge status: Home / Self Care | Payer: Medicare Other | Source: Ambulatory Visit | Attending: Internal Medicine | Admitting: Internal Medicine

## 2012-06-05 DIAGNOSIS — Z1231 Encounter for screening mammogram for malignant neoplasm of breast: Secondary | ICD-10-CM

## 2012-08-11 DIAGNOSIS — E785 Hyperlipidemia, unspecified: Secondary | ICD-10-CM | POA: Diagnosis not present

## 2012-08-11 DIAGNOSIS — M899 Disorder of bone, unspecified: Secondary | ICD-10-CM | POA: Diagnosis not present

## 2012-08-28 DIAGNOSIS — Z0000 Encounter for general adult medical examination without abnormal findings: Secondary | ICD-10-CM | POA: Diagnosis not present

## 2012-08-28 DIAGNOSIS — H04129 Dry eye syndrome of unspecified lacrimal gland: Secondary | ICD-10-CM | POA: Diagnosis not present

## 2012-08-28 DIAGNOSIS — M25559 Pain in unspecified hip: Secondary | ICD-10-CM | POA: Diagnosis not present

## 2012-08-28 DIAGNOSIS — N39498 Other specified urinary incontinence: Secondary | ICD-10-CM | POA: Diagnosis not present

## 2012-08-28 DIAGNOSIS — Z1212 Encounter for screening for malignant neoplasm of rectum: Secondary | ICD-10-CM | POA: Diagnosis not present

## 2012-08-28 DIAGNOSIS — D869 Sarcoidosis, unspecified: Secondary | ICD-10-CM | POA: Diagnosis not present

## 2012-08-28 DIAGNOSIS — E785 Hyperlipidemia, unspecified: Secondary | ICD-10-CM | POA: Diagnosis not present

## 2012-08-28 DIAGNOSIS — Z1331 Encounter for screening for depression: Secondary | ICD-10-CM | POA: Diagnosis not present

## 2012-08-28 DIAGNOSIS — I4891 Unspecified atrial fibrillation: Secondary | ICD-10-CM | POA: Diagnosis not present

## 2012-08-28 DIAGNOSIS — K5901 Slow transit constipation: Secondary | ICD-10-CM | POA: Diagnosis not present

## 2012-09-01 DIAGNOSIS — L909 Atrophic disorder of skin, unspecified: Secondary | ICD-10-CM | POA: Diagnosis not present

## 2012-09-01 DIAGNOSIS — Z85828 Personal history of other malignant neoplasm of skin: Secondary | ICD-10-CM | POA: Diagnosis not present

## 2012-09-01 DIAGNOSIS — L570 Actinic keratosis: Secondary | ICD-10-CM | POA: Diagnosis not present

## 2012-09-01 DIAGNOSIS — D239 Other benign neoplasm of skin, unspecified: Secondary | ICD-10-CM | POA: Diagnosis not present

## 2012-09-01 DIAGNOSIS — T148 Other injury of unspecified body region: Secondary | ICD-10-CM | POA: Diagnosis not present

## 2012-09-01 DIAGNOSIS — W57XXXA Bitten or stung by nonvenomous insect and other nonvenomous arthropods, initial encounter: Secondary | ICD-10-CM | POA: Diagnosis not present

## 2012-09-01 DIAGNOSIS — L821 Other seborrheic keratosis: Secondary | ICD-10-CM | POA: Diagnosis not present

## 2012-09-01 DIAGNOSIS — L919 Hypertrophic disorder of the skin, unspecified: Secondary | ICD-10-CM | POA: Diagnosis not present

## 2012-10-23 ENCOUNTER — Other Ambulatory Visit: Payer: Self-pay | Admitting: Internal Medicine

## 2012-11-28 ENCOUNTER — Other Ambulatory Visit: Payer: Self-pay | Admitting: Internal Medicine

## 2013-01-19 DIAGNOSIS — Z23 Encounter for immunization: Secondary | ICD-10-CM | POA: Diagnosis not present

## 2013-04-08 ENCOUNTER — Other Ambulatory Visit: Payer: Self-pay | Admitting: Internal Medicine

## 2013-04-10 ENCOUNTER — Other Ambulatory Visit: Payer: Self-pay | Admitting: *Deleted

## 2013-04-10 DIAGNOSIS — D190 Benign neoplasm of mesothelial tissue of pleura: Secondary | ICD-10-CM

## 2013-04-14 ENCOUNTER — Ambulatory Visit: Payer: Medicare Other | Admitting: Surgery

## 2013-04-15 ENCOUNTER — Encounter: Payer: Self-pay | Admitting: Surgery

## 2013-04-15 ENCOUNTER — Ambulatory Visit
Admission: RE | Admit: 2013-04-15 | Discharge: 2013-04-15 | Disposition: A | Discharge status: Home / Self Care | Payer: Medicare Other | Source: Ambulatory Visit | Attending: Surgery | Admitting: Surgery

## 2013-04-15 ENCOUNTER — Ambulatory Visit (INDEPENDENT_AMBULATORY_CARE_PROVIDER_SITE_OTHER): Payer: Medicare Other | Admitting: Surgery

## 2013-04-15 VITALS — BP 117/69 | HR 60 | Resp 20 | Ht 67.0 in | Wt 149.0 lb

## 2013-04-15 DIAGNOSIS — Z86018 Personal history of other benign neoplasm: Secondary | ICD-10-CM

## 2013-04-15 DIAGNOSIS — D190 Benign neoplasm of mesothelial tissue of pleura: Secondary | ICD-10-CM

## 2013-04-15 DIAGNOSIS — Z8709 Personal history of other diseases of the respiratory system: Secondary | ICD-10-CM

## 2013-04-15 DIAGNOSIS — R9389 Abnormal findings on diagnostic imaging of other specified body structures: Secondary | ICD-10-CM | POA: Diagnosis not present

## 2013-04-15 NOTE — Progress Notes (Signed)
      HPI:  The patient returns to my office today for followup status post left thoracotomy and wedge resection of a left lower lobe lung mass on 12/03/2005. 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 Since I saw her about 1 year ago she has continued to do well. She denies any chest pain. She's had no shortness of breath. She denies any cough or hemoptysis.      Current Outpatient Prescriptions  Medication Sig Dispense Refill  . dicyclomine (BENTYL) 10 MG capsule Take 10 mg by mouth 3 (three) times daily as needed.       Marland Kitchen ELIQUIS 5 MG TABS tablet TAKE ONE TABLET TWICE DAILY  60 tablet  0  . fish oil-omega-3 fatty acids 1000 MG capsule Take 1,200 g by mouth daily.       Marland Kitchen glucosamine-chondroitin 500-400 MG tablet Take 1 tablet by mouth 2 (two) times daily.       . magnesium 30 MG tablet Take 30 mg by mouth once as needed.       . metoprolol succinate (TOPROL-XL) 50 MG 24 hr tablet TAKE ONE TABLET EACH DAY WITH OR IMMEDIATELY FOLLOWING A MEAL  30 tablet  2   No current facility-administered medications for this visit.     Physical Exam: BP 117/69  Pulse 60  Resp 20  Ht 5\' 7"  (1.702 m)  Wt 149 lb (67.586 kg)  BMI 23.33 kg/m2  SpO2 98% She looks well. Lung exam is clear. Cardiac exam shows a regular rate and rhythm with normal heart sounds.   Diagnostic Tests:  CLINICAL DATA: Surveillance evaluation. Patient with a history of  benign fibrous tumor  EXAM:  CHEST 2 VIEW  COMPARISON: 04/15/2012  FINDINGS:  The cardiac silhouette is within normal limits. Aorta is tortuous.  No focal regions of consolidation or focal infiltrates appreciated.  The osseous structures are unremarkable.  IMPRESSION:  No active cardiopulmonary disease.  Electronically Signed  By: Salome Holmes M.D.  On: 04/15/2013 11:29   Impression:  She continues to do well following resection of a solitary fibrous tumor of the pleura in August of 2007.   Plan:   I'll see her back  in one year with a repeat chest x-ray.

## 2013-05-11 ENCOUNTER — Other Ambulatory Visit: Payer: Self-pay

## 2013-05-11 MED ORDER — APIXABAN 5 MG PO TABS: ORAL_TABLET | ORAL | Status: DC

## 2013-05-11 NOTE — Telephone Encounter (Signed)
Patient called upset about not  Receiving a recall letter for her appointment which delays her getting her meds. I made her appointment 2.25.15 @ 800 with Dr Caryl Comes.

## 2013-05-12 ENCOUNTER — Other Ambulatory Visit: Payer: Self-pay

## 2013-05-12 DIAGNOSIS — Z1231 Encounter for screening mammogram for malignant neoplasm of breast: Secondary | ICD-10-CM

## 2013-06-09 ENCOUNTER — Ambulatory Visit
Admission: RE | Admit: 2013-06-09 | Discharge: 2013-06-09 | Disposition: A | Discharge status: Home / Self Care | Payer: Medicare Other | Source: Ambulatory Visit

## 2013-06-09 DIAGNOSIS — Z1231 Encounter for screening mammogram for malignant neoplasm of breast: Secondary | ICD-10-CM | POA: Diagnosis not present

## 2013-06-24 ENCOUNTER — Other Ambulatory Visit: Payer: Self-pay | Admitting: Internal Medicine

## 2013-06-24 ENCOUNTER — Ambulatory Visit: Payer: Medicare Other | Admitting: Internal Medicine

## 2013-07-13 ENCOUNTER — Ambulatory Visit (INDEPENDENT_AMBULATORY_CARE_PROVIDER_SITE_OTHER): Payer: Medicare Other | Admitting: Internal Medicine

## 2013-07-13 ENCOUNTER — Encounter: Payer: Self-pay | Admitting: Internal Medicine

## 2013-07-13 ENCOUNTER — Other Ambulatory Visit: Payer: Self-pay | Admitting: Internal Medicine

## 2013-07-13 VITALS — BP 116/63 | HR 67 | Ht 67.0 in | Wt 155.0 lb

## 2013-07-13 DIAGNOSIS — I4891 Unspecified atrial fibrillation: Secondary | ICD-10-CM | POA: Diagnosis not present

## 2013-07-13 MED ORDER — FUROSEMIDE 20 MG PO TABS: ORAL_TABLET | ORAL | Status: DC

## 2013-07-13 MED ORDER — APIXABAN 5 MG PO TABS: ORAL_TABLET | ORAL | Status: DC

## 2013-07-13 NOTE — Patient Instructions (Signed)
Your physician has recommended you make the following change in your medication:  1) Increase Magnesium to 1 tablet daily 2) Start Lasix 20 mg every other day, then take once daily as needed for swelling  Your physician has requested that you have an echocardiogram. Echocardiography is a painless test that uses sound waves to create images of your heart. It provides your doctor with information about the size and shape of your heart and how well your heart's chambers and valves are working. This procedure takes approximately one hour. There are no restrictions for this procedure.  Your physician wants you to follow-up in: 6 months with Dr. Caryl Comes. You will receive a reminder letter in the mail two months in advance. If you don't receive a letter, please call our office to schedule the follow-up appointment.

## 2013-07-13 NOTE — Progress Notes (Signed)
Patient Care Team: Precious Reel, MD as PCP - General (Internal Medicine) Gaye Pollack, MD (Cardiothoracic Surgery) Precious Reel, MD as Consulting Physician (Internal Medicine)   HPI  [name] is a 78 y.o. female Seen in followup for exercise associated dizziness and palpitations with history of chest pain Stress echo demonstrated no echocardiographic changes; images were poor. Left ventricular function was normal. CBC and TSH were normal These occurred in the setting of significant psycho social stress  Event recorder>>atrial fibrillation. Atenolol resulted in palpitations being much improved     Echo stress test 4/13 demonstrated normal left ventricular function with mild MR and moderate MVP    She is reason shortness of breath notably particularly climbing stairs. There is no associated chest discomfort. She's had some peripheral edema which is more notable at the end of the day. She is not quite sure how long it has been present. Overall she thinks that these things have been going on for about 2-6 months   Past Medical History  Diagnosis Date  . Hyperlipidemia   . Fracture     at l1  . Fracture     right arm x 2  . Tachyarrhythmia   . Benign tumor of pleura   . Seasonal allergies   . Arthritis   . Bursitis   . Cataracts, bilateral   . Sarcoidosis     As a child  . A-fib     Past Surgical History  Procedure Laterality Date  . Abdominal hysterectomy    . Left video-assisted thoracoscopy, left thoracotomy with wedge resection of left lower lobe lung mass.  insertion of on-q pain pump  12/03/2005    [facility]  . Appendectomy    . Vaginal hysterectomy    . Fracture surgery      Current Outpatient Prescriptions  Medication Sig Dispense Refill  . apixaban (ELIQUIS) 5 MG TABS tablet TAKE ONE TABLET TWICE DAILY  60 tablet  1  . Docusate Calcium (STOOL SOFTENER PO) Take by mouth daily.      . fish oil-omega-3 fatty acids 1000 MG capsule Take 1,200 g by mouth daily.        Marland Kitchen glucosamine-chondroitin 500-400 MG tablet Take 1 tablet by mouth 2 (two) times daily.       . magnesium 30 MG tablet Take 30 mg by mouth once as needed.       . metoprolol succinate (TOPROL-XL) 50 MG 24 hr tablet TAKE ONE TABLET EACH DAY WITH OR IMMEDIATELY FOLLOWING A MEAL  30 tablet  0   No current facility-administered medications for this visit.    No Known Allergies  Review of Systems negative except from HPI and PMH  Physical Exam BP 116/63  Pulse 67  Ht 5\' 7"  (1.702 m)  Wt 155 lb (70.308 kg)  BMI 24.27 kg/m2 Well developed and well nourished in no acute distress HENT normal E scleral and icterus clear Neck Supple JVP 7-8; carotids brisk and full Clear to ausculation   Regular rate and rhythm, no murmurs gallops or rub Soft with active bowel sounds No clubbing cyanosis Trace Edema Alert and oriented, grossly normal motor and sensory function Skin Warm and Dry ECG demonstrates sinus rhythm at 67 Intervals 19/07/41 Axis is 91   Assessment and  Plan  Dyspnea on exertion  Mitral valve prolapse  Paroxysmal atrial fibrillation  Low-voltage limb leads  She has dyspnea on exertion with evidence of volume overload. Her diet is quite  salt replete. We'll add a low-dose diuretic and encouraged her to decrease her sodium intake.  With her history of mitral valve prolapse, we'll undertake at echocardiogram to look at mitral valve apparatus and with her low voltage in limb leads we will check to make sure there is no evidence to suggest amyloid.  For symptoms, we will begin her on a low-dose diuretic  Last laboratories were 2013 R. System we will get them from her PCP

## 2013-08-04 ENCOUNTER — Ambulatory Visit (HOSPITAL_COMMUNITY): Payer: Medicare Other | Attending: Internal Medicine | Admitting: Radiology

## 2013-08-04 DIAGNOSIS — I4891 Unspecified atrial fibrillation: Secondary | ICD-10-CM | POA: Diagnosis not present

## 2013-08-04 NOTE — Progress Notes (Signed)
Echocardiogram performed.  

## 2013-08-17 ENCOUNTER — Telehealth: Payer: Self-pay | Admitting: Internal Medicine

## 2013-08-17 NOTE — Telephone Encounter (Signed)
New message     Returning a nurses from Friday.  Only home till 11am

## 2013-08-19 NOTE — Telephone Encounter (Signed)
Follow Up:  Pt wants a call back regarding echo results

## 2013-08-19 NOTE — Telephone Encounter (Signed)
Patient notified of ECHO results.  Reviewed EF% and Mitral valve status, as patient had specific questions regarding the results related to those two areas, in particular. Patient verbalized appreciation of results. Denies further questions.

## 2014-01-05 DIAGNOSIS — E785 Hyperlipidemia, unspecified: Secondary | ICD-10-CM | POA: Diagnosis not present

## 2014-01-05 DIAGNOSIS — N39498 Other specified urinary incontinence: Secondary | ICD-10-CM | POA: Diagnosis not present

## 2014-01-05 DIAGNOSIS — E559 Vitamin D deficiency, unspecified: Secondary | ICD-10-CM | POA: Diagnosis not present

## 2014-01-05 DIAGNOSIS — I4891 Unspecified atrial fibrillation: Secondary | ICD-10-CM | POA: Diagnosis not present

## 2014-01-06 DIAGNOSIS — Z23 Encounter for immunization: Secondary | ICD-10-CM | POA: Diagnosis not present

## 2014-01-11 DIAGNOSIS — K589 Irritable bowel syndrome without diarrhea: Secondary | ICD-10-CM | POA: Diagnosis not present

## 2014-01-11 DIAGNOSIS — Z1331 Encounter for screening for depression: Secondary | ICD-10-CM | POA: Diagnosis not present

## 2014-01-11 DIAGNOSIS — Z23 Encounter for immunization: Secondary | ICD-10-CM | POA: Diagnosis not present

## 2014-01-11 DIAGNOSIS — M25559 Pain in unspecified hip: Secondary | ICD-10-CM | POA: Diagnosis not present

## 2014-01-11 DIAGNOSIS — E785 Hyperlipidemia, unspecified: Secondary | ICD-10-CM | POA: Diagnosis not present

## 2014-01-11 DIAGNOSIS — Z0000 Encounter for general adult medical examination without abnormal findings: Secondary | ICD-10-CM | POA: Diagnosis not present

## 2014-01-11 DIAGNOSIS — M1610 Unilateral primary osteoarthritis, unspecified hip: Secondary | ICD-10-CM | POA: Diagnosis not present

## 2014-01-11 DIAGNOSIS — E559 Vitamin D deficiency, unspecified: Secondary | ICD-10-CM | POA: Diagnosis not present

## 2014-01-11 DIAGNOSIS — M169 Osteoarthritis of hip, unspecified: Secondary | ICD-10-CM | POA: Diagnosis not present

## 2014-01-11 DIAGNOSIS — I059 Rheumatic mitral valve disease, unspecified: Secondary | ICD-10-CM | POA: Diagnosis not present

## 2014-01-11 DIAGNOSIS — I4891 Unspecified atrial fibrillation: Secondary | ICD-10-CM | POA: Diagnosis not present

## 2014-01-11 DIAGNOSIS — K5900 Constipation, unspecified: Secondary | ICD-10-CM | POA: Diagnosis not present

## 2014-01-12 DIAGNOSIS — Z1212 Encounter for screening for malignant neoplasm of rectum: Secondary | ICD-10-CM | POA: Diagnosis not present

## 2014-01-20 DIAGNOSIS — L570 Actinic keratosis: Secondary | ICD-10-CM | POA: Diagnosis not present

## 2014-01-20 DIAGNOSIS — Z85828 Personal history of other malignant neoplasm of skin: Secondary | ICD-10-CM | POA: Diagnosis not present

## 2014-01-20 DIAGNOSIS — L819 Disorder of pigmentation, unspecified: Secondary | ICD-10-CM | POA: Diagnosis not present

## 2014-01-20 DIAGNOSIS — D2370 Other benign neoplasm of skin of unspecified lower limb, including hip: Secondary | ICD-10-CM | POA: Diagnosis not present

## 2014-01-20 DIAGNOSIS — L821 Other seborrheic keratosis: Secondary | ICD-10-CM | POA: Diagnosis not present

## 2014-01-21 ENCOUNTER — Other Ambulatory Visit: Payer: Self-pay | Admitting: Internal Medicine

## 2014-02-01 ENCOUNTER — Encounter: Payer: Self-pay | Admitting: Internal Medicine

## 2014-02-01 ENCOUNTER — Ambulatory Visit (INDEPENDENT_AMBULATORY_CARE_PROVIDER_SITE_OTHER): Payer: Medicare Other | Admitting: Internal Medicine

## 2014-02-01 VITALS — BP 112/64 | HR 60 | Ht 67.0 in | Wt 158.0 lb

## 2014-02-01 DIAGNOSIS — I509 Heart failure, unspecified: Secondary | ICD-10-CM | POA: Diagnosis not present

## 2014-02-01 DIAGNOSIS — I341 Nonrheumatic mitral (valve) prolapse: Secondary | ICD-10-CM

## 2014-02-01 DIAGNOSIS — I5030 Unspecified diastolic (congestive) heart failure: Secondary | ICD-10-CM

## 2014-02-01 DIAGNOSIS — I480 Paroxysmal atrial fibrillation: Secondary | ICD-10-CM

## 2014-02-01 NOTE — Progress Notes (Signed)
Patient Care Team: Precious Reel, MD as PCP - General (Internal Medicine) Gaye Pollack, MD (Cardiothoracic Surgery) Precious Reel, MD as Consulting Physician (Internal Medicine)   HPI  [name] is a 78 y.o. female Seen in followup for exercise associated dizziness and palpitations with history of chest pain Stress echo demonstrated no echocardiographic changes; images were poor. Left ventricular function was normal  CBC and TSH were normal These occurred in the setting of significant psycho social stress   Event recorder>>atrial fibrillation. Atenolol resulted in palpitations being much improved     Echo stress test 4/13 demonstrated normal left ventricular function with mild MR and moderate MVP    Echocardiogram 4/15 demonstrated normal LV function no significant atrial enlargement (30/1.66) and normal wall thickness  She was seen last in March she is complaining of dyspnea on exertion volume overload. A diuretic was initiated  Past Medical History  Diagnosis Date  . Hyperlipidemia   . Fracture     at l1  . Fracture     right arm x 2  . Tachyarrhythmia   . Benign tumor of pleura   . Seasonal allergies   . Arthritis   . Bursitis   . Cataracts, bilateral   . Sarcoidosis     As a child  . A-fib     Past Surgical History  Procedure Laterality Date  . Abdominal hysterectomy    . Left video-assisted thoracoscopy, left thoracotomy with wedge resection of left lower lobe lung mass.  insertion of on-q pain pump  12/03/2005    [facility]  . Appendectomy    . Vaginal hysterectomy    . Fracture surgery      Current Outpatient Prescriptions  Medication Sig Dispense Refill  . BIOTIN PO Take 1,000 mcg by mouth daily. Has Vit B according to pt.      . Cholecalciferol (D3-1000) 1000 UNITS capsule Take 1,000 Units by mouth daily.      Mariane Baumgarten Calcium (STOOL SOFTENER PO) Take by mouth daily.      Marland Kitchen ELIQUIS 5 MG TABS tablet TAKE ONE TABLET TWICE DAILY  60 tablet  0  .  fish oil-omega-3 fatty acids 1000 MG capsule Take 1 g by mouth daily.       . furosemide (LASIX) 20 MG tablet Take 20 mg every other day for 10 days. Then take once daily as needed for swelling.  30 tablet  3  . Glucosamine HCl (GLUCOSAMINE PO) Take 800 mg by mouth daily.      . Ibuprofen-Diphenhydramine Cit (ADVIL PM PO) Take 1 tablet by mouth at bedtime.      . Loratadine (CLARITIN PO) Take 1 tablet by mouth daily.      . Magnesium 250 MG TABS Take 250 mg by mouth daily.      . metoprolol succinate (TOPROL-XL) 50 MG 24 hr tablet TAKE ONE TABLET EACH DAY WITH OR IMMEDIATELY FOLLOWING A MEAL  30 tablet  0  . pyridOXINE (VITAMIN B-6) 100 MG tablet Take 100 mg by mouth daily.       No current facility-administered medications for this visit.    No Known Allergies  Review of Systems negative except from HPI and PMH  Physical Exam BP 112/64  Pulse 60  Ht 5\' 7"  (1.702 m)  Wt 158 lb (71.668 kg)  BMI 24.74 kg/m2 Well developed and well nourished in no acute distress HENT normal E scleral and icterus clear Neck Supple JVP 7-8;  carotids brisk and full Clear to ausculation   Regular rate and rhythm, no murmurs gallops or rub Soft with active bowel sounds No clubbing cyanosis Trace Edema Alert and oriented, grossly normal motor and sensory function Skin Warm and Dry ECG demonstrates sinus rhythm at 67 Intervals 19/07/41 Axis is 91   Assessment and  Plan  Mitral valve prolapse  Paroxysmal atrial fibrillation  Low-voltage limb leads  HFpEF  She is euvolemic  She has used her diuretics when necessary.  Her biggest issue is her hip. She anticipates repla.cement.  We will continue her on apixaban

## 2014-02-01 NOTE — Patient Instructions (Signed)
Your physician recommends that you continue on your current medications as directed. Please refer to the Current Medication list given to you today.  Your physician wants you to follow-up in: 6 months with Dr. Klein. You will receive a reminder letter in the mail two months in advance. If you don't receive a letter, please call our office to schedule the follow-up appointment.  

## 2014-02-16 DIAGNOSIS — M859 Disorder of bone density and structure, unspecified: Secondary | ICD-10-CM | POA: Diagnosis not present

## 2014-02-16 DIAGNOSIS — M8580 Other specified disorders of bone density and structure, unspecified site: Secondary | ICD-10-CM | POA: Diagnosis not present

## 2014-02-22 ENCOUNTER — Other Ambulatory Visit: Payer: Self-pay | Admitting: Internal Medicine

## 2014-03-01 DIAGNOSIS — M1611 Unilateral primary osteoarthritis, right hip: Secondary | ICD-10-CM | POA: Diagnosis not present

## 2014-03-04 ENCOUNTER — Other Ambulatory Visit: Payer: Self-pay | Admitting: Internal Medicine

## 2014-04-07 ENCOUNTER — Telehealth: Payer: Self-pay | Admitting: *Deleted

## 2014-04-07 DIAGNOSIS — M25551 Pain in right hip: Secondary | ICD-10-CM | POA: Diagnosis not present

## 2014-04-07 DIAGNOSIS — M1611 Unilateral primary osteoarthritis, right hip: Secondary | ICD-10-CM | POA: Diagnosis not present

## 2014-04-07 NOTE — Telephone Encounter (Signed)
Faxed surgery clearance to [facility], for right hip: THA w/wo autograft/allograft. Eliquis to be stopped 36 hours prior to surgery, per Dr. Caryl Comes.

## 2014-04-13 ENCOUNTER — Other Ambulatory Visit: Payer: Self-pay | Admitting: Surgery

## 2014-04-13 DIAGNOSIS — I4891 Unspecified atrial fibrillation: Secondary | ICD-10-CM

## 2014-04-14 ENCOUNTER — Ambulatory Visit (INDEPENDENT_AMBULATORY_CARE_PROVIDER_SITE_OTHER): Payer: Medicare Other | Admitting: Surgery

## 2014-04-14 ENCOUNTER — Encounter: Payer: Self-pay | Admitting: Surgery

## 2014-04-14 ENCOUNTER — Ambulatory Visit
Admission: RE | Admit: 2014-04-14 | Discharge: 2014-04-14 | Disposition: A | Discharge status: Home / Self Care | Payer: Medicare Other | Source: Ambulatory Visit | Attending: Surgery | Admitting: Surgery

## 2014-04-14 VITALS — BP 119/68 | HR 74 | Resp 20 | Ht 67.0 in | Wt 158.0 lb

## 2014-04-14 DIAGNOSIS — R918 Other nonspecific abnormal finding of lung field: Secondary | ICD-10-CM | POA: Diagnosis not present

## 2014-04-14 DIAGNOSIS — Z86018 Personal history of other benign neoplasm: Secondary | ICD-10-CM

## 2014-04-14 DIAGNOSIS — I4891 Unspecified atrial fibrillation: Secondary | ICD-10-CM

## 2014-04-14 DIAGNOSIS — Z8709 Personal history of other diseases of the respiratory system: Secondary | ICD-10-CM

## 2014-04-17 ENCOUNTER — Encounter: Payer: Self-pay | Admitting: Surgery

## 2014-04-17 NOTE — Progress Notes (Signed)
      HPI:  The patient returns to my office today for followup status post left thoracotomy and wedge resection of a left lower lobe lung mass on 12/03/2005. 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 Since I saw her about 1 year ago she has continued to do well. She denies any chest pain. She's had no shortness of breath. She denies any cough or hemoptysis.   Current Outpatient Prescriptions  Medication Sig Dispense Refill  . BIOTIN PO Take 1,000 mcg by mouth daily. Has Vit B according to pt.    . Cholecalciferol (D3-1000) 1000 UNITS capsule Take 1,000 Units by mouth daily.    Mariane Baumgarten Calcium (STOOL SOFTENER PO) Take by mouth daily.    Marland Kitchen ELIQUIS 5 MG TABS tablet TAKE ONE TABLET TWICE DAILY 60 tablet 5  . fish oil-omega-3 fatty acids 1000 MG capsule Take 1 g by mouth daily.     . furosemide (LASIX) 20 MG tablet Take 20 mg every other day for 10 days. Then take once daily as needed for swelling. 30 tablet 3  . Glucosamine HCl (GLUCOSAMINE PO) Take 800 mg by mouth daily.    . Ibuprofen-Diphenhydramine Cit (ADVIL PM PO) Take 1 tablet by mouth at bedtime.    . Loratadine (CLARITIN PO) Take 1 tablet by mouth daily.    . Magnesium 250 MG TABS Take 250 mg by mouth daily.    . metoprolol succinate (TOPROL-XL) 50 MG 24 hr tablet TAKE ONE TABLET EACH DAY WITH OR IMMEDIATELY FOLLOWING A MEAL 30 tablet 6  . pyridOXINE (VITAMIN B-6) 100 MG tablet Take 100 mg by mouth daily.     No current facility-administered medications for this visit.     Physical Exam:  BP 119/68 mmHg  Pulse 74  Resp 20  Ht 5\' 7"  (1.702 m)  Wt 158 lb (71.668 kg)  BMI 24.74 kg/m2  SpO2 92% Lung exam is clear. Cardiac exam shows a regular rate and rhythm with normal heart sounds.    Diagnostic Tests:  CLINICAL DATA: History of benign fibrous tumor of lung 2007. History of atrial fibrillation. No complaints.  EXAM: CHEST 2 VIEW  COMPARISON: 04/15/2013  FINDINGS: Mild tortuosity of  the thoracic aorta. Heart is normal size. Mild hyperinflation of the lungs without confluent airspace opacity or effusion. No acute bony abnormality.  IMPRESSION: No active cardiopulmonary disease. No change.   Electronically Signed  By: Rolm Baptise M.D.  On: 04/14/2014 09:40   Impression:  She continues to do well following resection of a solitary fibrous tumor of the pleura in August of 2007.   Plan:   I'll see her back in one year with a repeat chest x-ray.

## 2014-05-10 ENCOUNTER — Other Ambulatory Visit (HOSPITAL_COMMUNITY): Payer: Self-pay | Admitting: *Deleted

## 2014-05-10 NOTE — Patient Instructions (Addendum)
[name]  05/10/2014   Your procedure is scheduled on: Tuesday January 19th, 2016  Report to [facility] Main  Entrance and follow signs to               [place] at 515 AM.  Call this number if you have problems the morning of surgery [phone]   Remember: stop eliquis 36 hours from dr Olin Pia instructions, follow up with dr Virgina Jock if your cough/cold gets any worse or you have fever and call Ivin Booty if you see dr Virgina Jock, phone [phone].  Do not eat food or drink liquids :After Midnight.     Take these medicines the morning of surgery with A SIP OF WATER: metoprolol succinate, claritin                               You may not have any metal on your body including hair pins and              piercings  Do not wear jewelry, make-up, lotions, powders or perfumes.             Do not wear nail polish.  Do not shave  48 hours prior to surgery.              Men may shave face and neck.   Do not bring valuables to the hospital. [place].  Contacts, dentures or bridgework may not be worn into surgery.  Leave suitcase in the car. After surgery it may be brought to your room.                  Please read over the following fact sheets you were given: _____________________________________________________________________             [facility] - Preparing for Surgery Before surgery, you can play an important role.  Because skin is not sterile, your skin needs to be as free of germs as possible.  You can reduce the number of germs on your skin by washing with CHG (chlorahexidine gluconate) soap before surgery.  CHG is an antiseptic cleaner which kills germs and bonds with the skin to continue killing germs even after washing. Please DO NOT use if you have an allergy to CHG or antibacterial soaps.  If your skin becomes reddened/irritated stop using the CHG and inform your nurse when you arrive at Short Stay. Do  not shave (including legs and underarms) for at least 48 hours prior to the first CHG shower.  You may shave your face/neck. Please follow these instructions carefully:  1.  Shower with CHG Soap the night before surgery and the  morning of Surgery.  2.  If you choose to wash your hair, wash your hair first as usual with your  normal  shampoo.  3.  After you shampoo, rinse your hair and body thoroughly to remove the  shampoo.                           4.  Use CHG as you would any other liquid soap.  You can apply chg directly  to the skin and wash  Gently with a scrungie or clean washcloth.  5.  Apply the CHG Soap to your body ONLY FROM THE NECK DOWN.   Do not use on face/ open                           Wound or open sores. Avoid contact with eyes, ears mouth and genitals (private parts).                       Wash face,  Genitals (private parts) with your normal soap.             6.  Wash thoroughly, paying special attention to the area where your surgery  will be performed.  7.  Thoroughly rinse your body with warm water from the neck down.  8.  DO NOT shower/wash with your normal soap after using and rinsing off  the CHG Soap.                9.  Pat yourself dry with a clean towel.            10.  Wear clean pajamas.            11.  Place clean sheets on your bed the night of your first shower and do not  sleep with pets. Day of Surgery : Do not apply any lotions/deodorants the morning of surgery.  Please wear clean clothes to the hospital/surgery center.  FAILURE TO FOLLOW THESE INSTRUCTIONS MAY RESULT IN THE CANCELLATION OF YOUR SURGERY PATIENT SIGNATURE_________________________________  NURSE SIGNATURE__________________________________  ________________________________________________________________________   Adam Phenix  An incentive spirometer is a tool that can help keep your lungs clear and active. This tool measures how well you are filling your  lungs with each breath. Taking long deep breaths may help reverse or decrease the chance of developing breathing (pulmonary) problems (especially infection) following:  A long period of time when you are unable to move or be active. BEFORE THE PROCEDURE   If the spirometer includes an indicator to show your best effort, your nurse or respiratory therapist will set it to a desired goal.  If possible, sit up straight or lean slightly forward. Try not to slouch.  Hold the incentive spirometer in an upright position. INSTRUCTIONS FOR USE   Sit on the edge of your bed if possible, or sit up as far as you can in bed or on a chair.  Hold the incentive spirometer in an upright position.  Breathe out normally.  Place the mouthpiece in your mouth and seal your lips tightly around it.  Breathe in slowly and as deeply as possible, raising the piston or the ball toward the top of the column.  Hold your breath for 3-5 seconds or for as long as possible. Allow the piston or ball to fall to the bottom of the column.  Remove the mouthpiece from your mouth and breathe out normally.  Rest for a few seconds and repeat Steps 1 through 7 at least 10 times every 1-2 hours when you are awake. Take your time and take a few normal breaths between deep breaths.  The spirometer may include an indicator to show your best effort. Use the indicator as a goal to work toward during each repetition.  After each set of 10 deep breaths, practice coughing to be sure your lungs are clear. If you have an incision (the cut made at the time of surgery),  support your incision when coughing by placing a pillow or rolled up towels firmly against it. Once you are able to get out of bed, walk around indoors and cough well. You may stop using the incentive spirometer when instructed by your caregiver.  RISKS AND COMPLICATIONS  Take your time so you do not get dizzy or light-headed.  If you are in pain, you may need to take or  ask for pain medication before doing incentive spirometry. It is harder to take a deep breath if you are having pain. AFTER USE  Rest and breathe slowly and easily.  It can be helpful to keep track of a log of your progress. Your caregiver can provide you with a simple table to help with this. If you are using the spirometer at home, follow these instructions: [place] IF:   You are having difficultly using the spirometer.  You have trouble using the spirometer as often as instructed.  Your pain medication is not giving enough relief while using the spirometer.  You develop fever of 100.5 F (38.1 C) or higher. SEEK IMMEDIATE MEDICAL CARE IF:   You cough up bloody sputum that had not been present before.  You develop fever of 102 F (38.9 C) or greater.  You develop worsening pain at or near the incision site. MAKE SURE YOU:   Understand these instructions.  Will watch your condition.  Will get help right away if you are not doing well or get worse. Document Released: 08/27/2006 Document Revised: 07/09/2011 Document Reviewed: 10/28/2006 Exit[employer] Patient Information 2014 Exit[employer], [place].   ________________________________________________________________________  WHAT IS A BLOOD TRANSFUSION? Blood Transfusion Information  A transfusion is the replacement of blood or some of its parts. Blood is made up of multiple cells which provide different functions.  Red blood cells carry oxygen and are used for blood loss replacement.  White blood cells fight against infection.  Platelets control bleeding.  Plasma helps clot blood.  Other blood products are available for specialized needs, such as hemophilia or other clotting disorders. BEFORE THE TRANSFUSION  Who gives blood for transfusions?   Healthy volunteers who are fully evaluated to make sure their blood is safe. This is blood bank blood. Transfusion therapy is the safest it has ever been in the practice of  medicine. Before blood is taken from a donor, a complete history is taken to make sure that person has no history of diseases nor engages in risky social behavior (examples are intravenous drug use or sexual activity with multiple partners). The donor's travel history is screened to minimize risk of transmitting infections, such as malaria. The donated blood is tested for signs of infectious diseases, such as HIV and hepatitis. The blood is then tested to be sure it is compatible with you in order to minimize the chance of a transfusion reaction. If you or a relative donates blood, this is often done in anticipation of surgery and is not appropriate for emergency situations. It takes many days to process the donated blood. RISKS AND COMPLICATIONS Although transfusion therapy is very safe and saves many lives, the main dangers of transfusion include:   Getting an infectious disease.  Developing a transfusion reaction. This is an allergic reaction to something in the blood you were given. Every precaution is taken to prevent this. The decision to have a blood transfusion has been considered carefully by your caregiver before blood is given. Blood is not given unless the benefits outweigh the risks. AFTER THE TRANSFUSION  Right after receiving a blood transfusion, you will usually feel much better and more energetic. This is especially true if your red blood cells have gotten low (anemic). The transfusion raises the level of the red blood cells which carry oxygen, and this usually causes an energy increase.  The nurse administering the transfusion will monitor you carefully for complications. HOME CARE INSTRUCTIONS  No special instructions are needed after a transfusion. You may find your energy is better. Speak with your caregiver about any limitations on activity for underlying diseases you may have. SEEK MEDICAL CARE IF:   Your condition is not improving after your transfusion.  You develop  redness or irritation at the intravenous (IV) site. SEEK IMMEDIATE MEDICAL CARE IF:  Any of the following symptoms occur over the next 12 hours:  Shaking chills.  You have a temperature by mouth above 102 F (38.9 C), not controlled by medicine.  Chest, back, or muscle pain.  People around you feel you are not acting correctly or are confused.  Shortness of breath or difficulty breathing.  Dizziness and fainting.  You get a rash or develop hives.  You have a decrease in urine output.  Your urine turns a dark color or changes to pink, red, or brown. Any of the following symptoms occur over the next 10 days:  You have a temperature by mouth above 102 F (38.9 C), not controlled by medicine.  Shortness of breath.  Weakness after normal activity.  The white part of the eye turns yellow (jaundice).  You have a decrease in the amount of urine or are urinating less often.  Your urine turns a dark color or changes to pink, red, or brown. Document Released: 04/13/2000 Document Revised: 07/09/2011 Document Reviewed: 12/01/2007 [facility] Patient Information 2014 [place], [place].  _______________________________________________________________________

## 2014-05-11 ENCOUNTER — Encounter (HOSPITAL_COMMUNITY)
Admission: RE | Admit: 2014-05-11 | Discharge: 2014-05-11 | Disposition: A | Discharge status: Home / Self Care | Payer: Medicare Other | Source: Ambulatory Visit | Attending: Orthopedic Surgery | Admitting: Orthopedic Surgery

## 2014-05-11 ENCOUNTER — Encounter (HOSPITAL_COMMUNITY): Payer: Self-pay

## 2014-05-11 DIAGNOSIS — M791 Myalgia: Secondary | ICD-10-CM | POA: Diagnosis not present

## 2014-05-11 DIAGNOSIS — I4891 Unspecified atrial fibrillation: Secondary | ICD-10-CM | POA: Insufficient documentation

## 2014-05-11 DIAGNOSIS — Z7902 Long term (current) use of antithrombotics/antiplatelets: Secondary | ICD-10-CM | POA: Diagnosis not present

## 2014-05-11 DIAGNOSIS — M1611 Unilateral primary osteoarthritis, right hip: Secondary | ICD-10-CM | POA: Diagnosis not present

## 2014-05-11 DIAGNOSIS — E785 Hyperlipidemia, unspecified: Secondary | ICD-10-CM | POA: Diagnosis not present

## 2014-05-11 DIAGNOSIS — Z01812 Encounter for preprocedural laboratory examination: Secondary | ICD-10-CM | POA: Insufficient documentation

## 2014-05-11 DIAGNOSIS — Z0183 Encounter for blood typing: Secondary | ICD-10-CM | POA: Diagnosis not present

## 2014-05-11 HISTORY — DX: Nausea with vomiting, unspecified: R11.2

## 2014-05-11 HISTORY — DX: Adverse effect of unspecified anesthetic, initial encounter: T41.45XA

## 2014-05-11 HISTORY — DX: Personal history of other medical treatment: Z92.89

## 2014-05-11 HISTORY — DX: Other complications of anesthesia, initial encounter: T88.59XA

## 2014-05-11 HISTORY — DX: Cough, unspecified: R05.9

## 2014-05-11 HISTORY — DX: Other specified postprocedural states: Z98.890

## 2014-05-11 HISTORY — DX: Cough: R05

## 2014-05-11 LAB — ABO/RH: ABO/RH(D): O POS

## 2014-05-11 LAB — BASIC METABOLIC PANEL
Anion gap: 10 (ref 5–15)
BUN: 18 mg/dL (ref 6–23)
CALCIUM: 9.3 mg/dL (ref 8.4–10.5)
CO2: 24 mmol/L (ref 19–32)
Chloride: 106 mEq/L (ref 96–112)
Creatinine, Ser: 0.83 mg/dL (ref 0.50–1.10)
GFR, EST AFRICAN AMERICAN: 77 mL/min — AB (ref >90)
GFR, EST NON AFRICAN AMERICAN: 67 mL/min — AB (ref >90)
GLUCOSE: 99 mg/dL (ref 70–99)
POTASSIUM: 4.2 mmol/L (ref 3.5–5.1)
Sodium: 140 mmol/L (ref 135–145)

## 2014-05-11 LAB — CBC
HEMATOCRIT: 44.4 % (ref 36.0–46.0)
HEMOGLOBIN: 14.4 g/dL (ref 12.0–15.0)
MCH: 29.1 pg (ref 26.0–34.0)
MCHC: 32.4 g/dL (ref 30.0–36.0)
MCV: 89.7 fL (ref 78.0–100.0)
Platelets: 210 10*3/uL (ref 150–400)
RBC: 4.95 MIL/uL (ref 3.87–5.11)
RDW: 14 % (ref 11.5–15.5)
WBC: 6.3 10*3/uL (ref 4.0–10.5)

## 2014-05-11 LAB — SURGICAL PCR SCREEN
MRSA, PCR: NEGATIVE
Staphylococcus aureus: NEGATIVE

## 2014-05-11 LAB — URINALYSIS, ROUTINE W REFLEX MICROSCOPIC
Bilirubin Urine: NEGATIVE
GLUCOSE, UA: NEGATIVE mg/dL
Hgb urine dipstick: NEGATIVE
KETONES UR: NEGATIVE mg/dL
LEUKOCYTES UA: NEGATIVE
Nitrite: NEGATIVE
PROTEIN: NEGATIVE mg/dL
Specific Gravity, Urine: 1.013 (ref 1.005–1.030)
UROBILINOGEN UA: 0.2 mg/dL (ref 0.0–1.0)
pH: 6 (ref 5.0–8.0)

## 2014-05-11 LAB — APTT: aPTT: 37 seconds (ref 24–37)

## 2014-05-11 LAB — PROTIME-INR
INR: 1.04 (ref 0.00–1.49)
PROTHROMBIN TIME: 13.7 s (ref 11.6–15.2)

## 2014-05-11 NOTE — Progress Notes (Signed)
Medical clearance note dr Virgina Jock on chart for 05-18-14 surgery Cardiac clearance note dr Caryl Comes on chart for 05-18-14 surgery lov dr bartle cardiothoracic 04-14-14 epic lov dr Caryl Comes cardiology 02-01-14 epic Chest 2 view xray 02-01-14 epic ekg 02-01-14 epic Patient with nonproductivectough today, slight sorethroat, patient will follow up with dr Virgina Jock if temperature develops, or cough gets worse and call nurse at pst back is she gets antibiotics or chest xray done

## 2014-05-13 NOTE — H&P (Signed)
TOTAL HIP ADMISSION H&P  Patient is admitted for right total hip arthroplasty, anterior approach.  Subjective:  Chief Complaint: Right hip primary OA / pain  HPI: [name], 79 y.o. female, has a history of pain and functional disability in the right hip(s) due to arthritis and patient has failed non-surgical conservative treatments for greater than 12 weeks to include NSAID's and/or analgesics and activity modification.  Onset of symptoms was gradual starting >10 years ago with gradually worsening course since that time.The patient noted no past surgery on the right hip(s).  Patient currently rates pain in the right hip at 10 out of 10 with activity. Patient has night pain, worsening of pain with activity and weight bearing, trendelenberg gait, pain that interfers with activities of daily living and pain with passive range of motion. Patient has evidence of periarticular osteophytes and joint space narrowing by imaging studies. This condition presents safety issues increasing the risk of falls.   There is no current active infection.  Risks, benefits and expectations were discussed with the patient.  Risks including but not limited to the risk of anesthesia, blood clots, nerve damage, blood vessel damage, failure of the prosthesis, infection and up to and including death.  Patient understand the risks, benefits and expectations and wishes to proceed with surgery.   PCP: Precious Reel, MD  D/C Plans:      Home with HHPT  Post-op Meds:       No Rx given   Tranexamic Acid:      To be given - IV   Decadron:      Is to be given  FYI:     Eliquis post-op (on prior to surgery)   Norco post-op   Patient Active Problem List   Diagnosis Date Noted  . Myalgia 04/02/2012  . Atrial fibrillation 07/23/2011  . Lightheadedness 07/23/2011  . Dyspnea on exertion 07/23/2011  . 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   . Hyperlipidemia   . Fracture    Past Medical History  Diagnosis Date  .  Hyperlipidemia   . Fracture     at l1  . Fracture     right arm x 2  . Tachyarrhythmia   . Benign tumor of pleura 2007  . Seasonal allergies   . Arthritis   . Bursitis   . Cataracts, bilateral   . Sarcoidosis     As a child  . Dysrhythmia     atrial fib  . History of blood transfusion age 73 or 89  . Complication of anesthesia   . PONV (postoperative nausea and vomiting) 48 yrs ago    with ether  . Cough since 05-09-2014    with sore throat    Past Surgical History  Procedure Laterality Date  . Left video-assisted thoracoscopy, left thoracotomy with wedge resection of left lower lobe lung mass.  insertion of on-q pain pump  12/03/2005    [facility]  . Appendectomy    . Vaginal hysterectomy    . Abdominal hysterectomy      complete  . Tonsillectomy  as child  . Fracture surgery Right 2007  . Rotator cuff repair Left 2008    No prescriptions prior to admission   No Known Allergies   History  Substance Use Topics  . Smoking status: Never Smoker   . Smokeless tobacco: Never Used  . Alcohol Use: Yes     Comment: occasional    Family History  Problem Relation Age of Onset  .  Heart disease Father   . Heart disease Brother   . Cancer Brother   . Cancer Maternal Grandfather      Review of Systems  Constitutional: Negative.   HENT: Negative.   Eyes: Negative.   Respiratory: Negative.   Cardiovascular: Negative.   Gastrointestinal: Positive for constipation.  Genitourinary: Negative.   Musculoskeletal: Positive for myalgias and joint pain.  Skin: Positive for itching.  Neurological: Negative.   Endo/Heme/Allergies: Positive for environmental allergies.  Psychiatric/Behavioral: The patient has insomnia.     Objective:  Physical Exam  Constitutional: She is oriented to person, place, and time. She appears well-developed and well-nourished.  HENT:  Head: Normocephalic and atraumatic.  Eyes: Pupils are equal, round, and reactive to light.  Neck: Neck supple. No JVD  present. No tracheal deviation present. No thyromegaly present.  Cardiovascular: Normal rate, regular rhythm, normal heart sounds and intact distal pulses.   Respiratory: Effort normal and breath sounds normal. No stridor. No respiratory distress. She has no wheezes.  GI: Soft. There is no tenderness. There is no guarding.  Musculoskeletal:       Right hip: She exhibits decreased range of motion, decreased strength, tenderness and bony tenderness. She exhibits no swelling, no deformity and no laceration.  Lymphadenopathy:    She has no cervical adenopathy.  Neurological: She is alert and oriented to person, place, and time.  Skin: Skin is warm and dry.  Psychiatric: She has a normal mood and affect.      Labs:  Estimated body mass index is 24.74 kg/(m^2) as calculated from the following:   Height as of 02/01/14: 5\' 7"  (1.702 m).   Weight as of 02/01/14: 71.668 kg (158 lb).   Imaging Review Plain radiographs demonstrate severe degenerative joint disease of the right hip(s). The bone quality appears to be good for age and reported activity level.  Assessment/Plan:  End stage arthritis, right hip(s)  The patient history, physical examination, clinical judgement of the provider and imaging studies are consistent with end stage degenerative joint disease of the right hip(s) and total hip arthroplasty is deemed medically necessary. The treatment options including medical management, injection therapy, arthroscopy and arthroplasty were discussed at length. The risks and benefits of total hip arthroplasty were presented and reviewed. The risks due to aseptic loosening, infection, stiffness, dislocation/subluxation,  thromboembolic complications and other imponderables were discussed.  The patient acknowledged the explanation, agreed to proceed with the plan and consent was signed. Patient is being admitted for inpatient treatment for surgery, pain control, PT, OT, prophylactic antibiotics, VTE  prophylaxis, progressive ambulation and ADL's and discharge planning.The patient is planning to be discharged home with home health services.     [place] Pugh [place] Kassebaum   PA-C  05/13/2014, 12:18 PM

## 2014-05-17 DIAGNOSIS — J309 Allergic rhinitis, unspecified: Secondary | ICD-10-CM | POA: Diagnosis not present

## 2014-05-17 DIAGNOSIS — R05 Cough: Secondary | ICD-10-CM | POA: Diagnosis not present

## 2014-05-17 DIAGNOSIS — M25551 Pain in right hip: Secondary | ICD-10-CM | POA: Diagnosis not present

## 2014-05-17 DIAGNOSIS — Z6825 Body mass index (BMI) 25.0-25.9, adult: Secondary | ICD-10-CM | POA: Diagnosis not present

## 2014-05-17 DIAGNOSIS — J069 Acute upper respiratory infection, unspecified: Secondary | ICD-10-CM | POA: Diagnosis not present

## 2014-05-17 DIAGNOSIS — I480 Paroxysmal atrial fibrillation: Secondary | ICD-10-CM | POA: Diagnosis not present

## 2014-05-17 NOTE — Anesthesia Preprocedure Evaluation (Addendum)
Anesthesia Evaluation  Patient identified by MRN, date of birth, ID band Patient awake    Reviewed: Allergy & Precautions, H&P , NPO status , Patient's Chart, lab work & pertinent test results, reviewed documented beta blocker date and time   History of Anesthesia Complications (+) PONV  Airway Mallampati: II  TM Distance: >3 FB Neck ROM: full    Dental no notable dental hx. (+) Teeth Intact, Dental Advisory Given   Pulmonary neg pulmonary ROS, shortness of breath and with exertion,  breath sounds clear to auscultation  Pulmonary exam normal       Cardiovascular Exercise Tolerance: Good + dysrhythmias Atrial Fibrillation Rhythm:regular Rate:Normal  Tachycardia - on beta blocker   Neuro/Psych negative neurological ROS  negative psych ROS   GI/Hepatic negative GI ROS, Neg liver ROS,   Endo/Other  negative endocrine ROS  Renal/GU negative Renal ROS  negative genitourinary   Musculoskeletal   Abdominal   Peds  Hematology negative hematology ROS (+)   Anesthesia Other Findings   Reproductive/Obstetrics negative OB ROS                            Anesthesia Physical Anesthesia Plan  ASA: III  Anesthesia Plan: Spinal   Post-op Pain Management:    Induction:   Airway Management Planned: Simple Face Mask  Additional Equipment:   Intra-op Plan:   Post-operative Plan:   Informed Consent: I have reviewed the patients History and Physical, chart, labs and discussed the procedure including the risks, benefits and alternatives for the proposed anesthesia with the patient or authorized representative who has indicated his/her understanding and acceptance.   Dental Advisory Given  Plan Discussed with: CRNA and Surgeon  Anesthesia Plan Comments:         Anesthesia Quick Evaluation

## 2014-05-18 ENCOUNTER — Inpatient Hospital Stay (HOSPITAL_COMMUNITY): Payer: Medicare Other

## 2014-05-18 ENCOUNTER — Inpatient Hospital Stay (HOSPITAL_COMMUNITY)
Admission: RE | Admit: 2014-05-18 | Discharge: 2014-05-19 | DRG: 470 | Disposition: A | Discharge status: Home Health | Payer: Medicare Other | Source: Ambulatory Visit | Attending: Orthopedic Surgery | Admitting: Orthopedic Surgery

## 2014-05-18 ENCOUNTER — Inpatient Hospital Stay (HOSPITAL_COMMUNITY): Payer: Medicare Other | Admitting: Anesthesiology

## 2014-05-18 ENCOUNTER — Encounter (HOSPITAL_COMMUNITY)
Admission: RE | Disposition: A | Discharge status: Home Health | Payer: Self-pay | Source: Ambulatory Visit | Attending: Orthopedic Surgery

## 2014-05-18 ENCOUNTER — Encounter (HOSPITAL_COMMUNITY): Payer: Self-pay

## 2014-05-18 DIAGNOSIS — E785 Hyperlipidemia, unspecified: Secondary | ICD-10-CM | POA: Diagnosis present

## 2014-05-18 DIAGNOSIS — Z96641 Presence of right artificial hip joint: Secondary | ICD-10-CM | POA: Diagnosis not present

## 2014-05-18 DIAGNOSIS — Z96649 Presence of unspecified artificial hip joint: Secondary | ICD-10-CM

## 2014-05-18 DIAGNOSIS — I4891 Unspecified atrial fibrillation: Secondary | ICD-10-CM | POA: Diagnosis present

## 2014-05-18 DIAGNOSIS — Z471 Aftercare following joint replacement surgery: Secondary | ICD-10-CM | POA: Diagnosis not present

## 2014-05-18 DIAGNOSIS — M1611 Unilateral primary osteoarthritis, right hip: Secondary | ICD-10-CM | POA: Diagnosis present

## 2014-05-18 DIAGNOSIS — M169 Osteoarthritis of hip, unspecified: Secondary | ICD-10-CM | POA: Diagnosis not present

## 2014-05-18 DIAGNOSIS — Z01812 Encounter for preprocedural laboratory examination: Secondary | ICD-10-CM | POA: Diagnosis not present

## 2014-05-18 DIAGNOSIS — M25551 Pain in right hip: Secondary | ICD-10-CM | POA: Diagnosis not present

## 2014-05-18 HISTORY — PX: TOTAL HIP ARTHROPLASTY: SHX124

## 2014-05-18 LAB — TYPE AND SCREEN
ABO/RH(D): O POS
Antibody Screen: NEGATIVE

## 2014-05-18 SURGERY — ARTHROPLASTY, HIP, TOTAL, ANTERIOR APPROACH
Anesthesia: Spinal | Laterality: Right

## 2014-05-18 MED ORDER — MENTHOL 3 MG MT LOZG
1.0000 | LOZENGE | OROMUCOSAL | Status: DC | PRN
  Filled 2014-05-18: qty 9

## 2014-05-18 MED ORDER — FERROUS SULFATE 325 (65 FE) MG PO TABS
325.0000 mg | ORAL_TABLET | Freq: Three times a day (TID) | ORAL | Status: DC
  Filled 2014-05-18 (×5): qty 1

## 2014-05-18 MED ORDER — LACTATED RINGERS IV SOLN
INTRAVENOUS | Status: DC | PRN
  Administered 2014-05-18 (×2): via INTRAVENOUS

## 2014-05-18 MED ORDER — CEFAZOLIN SODIUM-DEXTROSE 2-3 GM-% IV SOLR
2.0000 g | Freq: Four times a day (QID) | INTRAVENOUS | Status: AC
  Administered 2014-05-18 (×2): 2 g via INTRAVENOUS
  Filled 2014-05-18 (×2): qty 50

## 2014-05-18 MED ORDER — METHOCARBAMOL 1000 MG/10ML IJ SOLN
500.0000 mg | Freq: Four times a day (QID) | INTRAMUSCULAR | Status: DC | PRN
  Filled 2014-05-18: qty 5

## 2014-05-18 MED ORDER — METOCLOPRAMIDE HCL 5 MG/ML IJ SOLN
5.0000 mg | Freq: Three times a day (TID) | INTRAMUSCULAR | Status: DC | PRN
  Administered 2014-05-18 – 2014-05-19 (×2): 10 mg via INTRAVENOUS
  Filled 2014-05-18 (×2): qty 2

## 2014-05-18 MED ORDER — KETOROLAC TROMETHAMINE 15 MG/ML IJ SOLN
15.0000 mg | Freq: Four times a day (QID) | INTRAMUSCULAR | Status: DC
  Administered 2014-05-18 – 2014-05-19 (×4): 15 mg via INTRAVENOUS
  Filled 2014-05-18 (×7): qty 1

## 2014-05-18 MED ORDER — PROMETHAZINE HCL 25 MG/ML IJ SOLN: 6.2500 mg | Freq: Four times a day (QID) | INTRAMUSCULAR | Status: DC | PRN

## 2014-05-18 MED ORDER — BUPIVACAINE HCL (PF) 0.5 % IJ SOLN
INTRAMUSCULAR | Status: AC
  Filled 2014-05-18: qty 30

## 2014-05-18 MED ORDER — METHOCARBAMOL 500 MG PO TABS
500.0000 mg | ORAL_TABLET | Freq: Four times a day (QID) | ORAL | Status: DC | PRN
  Administered 2014-05-18 – 2014-05-19 (×2): 500 mg via ORAL
  Filled 2014-05-18 (×2): qty 1

## 2014-05-18 MED ORDER — BUPIVACAINE IN DEXTROSE 0.75-8.25 % IT SOLN: INTRATHECAL | Status: DC | PRN

## 2014-05-18 MED ORDER — FENTANYL CITRATE 0.05 MG/ML IJ SOLN
INTRAMUSCULAR | Status: AC
  Filled 2014-05-18: qty 5

## 2014-05-18 MED ORDER — BISACODYL 10 MG RE SUPP: 10.0000 mg | Freq: Every day | RECTAL | Status: DC | PRN

## 2014-05-18 MED ORDER — PROPOFOL 10 MG/ML IV BOLUS
INTRAVENOUS | Status: AC
  Filled 2014-05-18: qty 20

## 2014-05-18 MED ORDER — PROPOFOL INFUSION 10 MG/ML OPTIME
INTRAVENOUS | Status: DC | PRN
  Administered 2014-05-18: 75 ug/kg/min via INTRAVENOUS

## 2014-05-18 MED ORDER — BUPIVACAINE HCL (PF) 0.5 % IJ SOLN
INTRAMUSCULAR | Status: DC | PRN
  Administered 2014-05-18: 3 mL

## 2014-05-18 MED ORDER — PHENYLEPHRINE HCL 10 MG/ML IJ SOLN
INTRAMUSCULAR | Status: DC | PRN
  Administered 2014-05-18 (×3): 40 ug via INTRAVENOUS

## 2014-05-18 MED ORDER — MAGNESIUM CITRATE PO SOLN: 1.0000 | Freq: Once | ORAL | Status: AC | PRN

## 2014-05-18 MED ORDER — DOCUSATE SODIUM 100 MG PO CAPS
100.0000 mg | ORAL_CAPSULE | Freq: Two times a day (BID) | ORAL | Status: DC
  Administered 2014-05-18 – 2014-05-19 (×3): 100 mg via ORAL

## 2014-05-18 MED ORDER — PHENOL 1.4 % MT LIQD
1.0000 | OROMUCOSAL | Status: DC | PRN
  Filled 2014-05-18: qty 177

## 2014-05-18 MED ORDER — ONDANSETRON HCL 4 MG/2ML IJ SOLN
4.0000 mg | Freq: Four times a day (QID) | INTRAMUSCULAR | Status: DC | PRN
  Administered 2014-05-19: 4 mg via INTRAVENOUS
  Filled 2014-05-18: qty 2

## 2014-05-18 MED ORDER — DIPHENHYDRAMINE HCL 25 MG PO CAPS: 25.0000 mg | ORAL_CAPSULE | Freq: Four times a day (QID) | ORAL | Status: DC | PRN

## 2014-05-18 MED ORDER — CELECOXIB 200 MG PO CAPS
200.0000 mg | ORAL_CAPSULE | Freq: Two times a day (BID) | ORAL | Status: DC
  Administered 2014-05-18 – 2014-05-19 (×2): 200 mg via ORAL
  Filled 2014-05-18 (×4): qty 1

## 2014-05-18 MED ORDER — ONDANSETRON HCL 4 MG/2ML IJ SOLN
INTRAMUSCULAR | Status: DC | PRN
  Administered 2014-05-18: 4 mg via INTRAVENOUS

## 2014-05-18 MED ORDER — ALUM & MAG HYDROXIDE-SIMETH 200-200-20 MG/5ML PO SUSP: 30.0000 mL | ORAL | Status: DC | PRN

## 2014-05-18 MED ORDER — ONDANSETRON HCL 4 MG/2ML IJ SOLN
INTRAMUSCULAR | Status: AC
  Filled 2014-05-18: qty 2

## 2014-05-18 MED ORDER — MIDAZOLAM HCL 2 MG/2ML IJ SOLN
INTRAMUSCULAR | Status: AC
  Filled 2014-05-18: qty 2

## 2014-05-18 MED ORDER — FENTANYL CITRATE 0.05 MG/ML IJ SOLN
INTRAMUSCULAR | Status: DC | PRN
  Administered 2014-05-18: 50 ug via INTRAVENOUS

## 2014-05-18 MED ORDER — METOPROLOL SUCCINATE ER 50 MG PO TB24
50.0000 mg | ORAL_TABLET | Freq: Every day | ORAL | Status: DC
  Filled 2014-05-18 (×3): qty 1

## 2014-05-18 MED ORDER — DEXAMETHASONE SODIUM PHOSPHATE 10 MG/ML IJ SOLN
10.0000 mg | Freq: Once | INTRAMUSCULAR | Status: AC
  Administered 2014-05-18: 10 mg via INTRAVENOUS

## 2014-05-18 MED ORDER — SODIUM CHLORIDE 0.9 % IR SOLN
Status: DC | PRN
  Administered 2014-05-18: 1000 mL

## 2014-05-18 MED ORDER — EPHEDRINE SULFATE 50 MG/ML IJ SOLN
INTRAMUSCULAR | Status: AC
  Filled 2014-05-18: qty 1

## 2014-05-18 MED ORDER — DEXAMETHASONE SODIUM PHOSPHATE 10 MG/ML IJ SOLN
10.0000 mg | Freq: Once | INTRAMUSCULAR | Status: AC
  Administered 2014-05-19: 10 mg via INTRAVENOUS
  Filled 2014-05-18: qty 1

## 2014-05-18 MED ORDER — POLYETHYLENE GLYCOL 3350 17 G PO PACK
17.0000 g | PACK | Freq: Two times a day (BID) | ORAL | Status: DC
  Administered 2014-05-18 – 2014-05-19 (×2): 17 g via ORAL

## 2014-05-18 MED ORDER — HYDROMORPHONE HCL 1 MG/ML IJ SOLN
INTRAMUSCULAR | Status: AC
  Filled 2014-05-18: qty 1

## 2014-05-18 MED ORDER — CEFAZOLIN SODIUM-DEXTROSE 2-3 GM-% IV SOLR
2.0000 g | INTRAVENOUS | Status: AC
  Administered 2014-05-18: 2 g via INTRAVENOUS

## 2014-05-18 MED ORDER — LORATADINE 10 MG PO TABS
10.0000 mg | ORAL_TABLET | Freq: Every day | ORAL | Status: DC
  Filled 2014-05-18 (×2): qty 1

## 2014-05-18 MED ORDER — SODIUM CHLORIDE 0.9 % IV BOLUS (SEPSIS)
500.0000 mL | Freq: Once | INTRAVENOUS | Status: AC
  Administered 2014-05-18: 500 mL via INTRAVENOUS

## 2014-05-18 MED ORDER — APIXABAN 5 MG PO TABS
5.0000 mg | ORAL_TABLET | Freq: Two times a day (BID) | ORAL | Status: DC
  Administered 2014-05-19: 5 mg via ORAL
  Filled 2014-05-18 (×2): qty 1

## 2014-05-18 MED ORDER — PHENYLEPHRINE 40 MCG/ML (10ML) SYRINGE FOR IV PUSH (FOR BLOOD PRESSURE SUPPORT)
PREFILLED_SYRINGE | INTRAVENOUS | Status: AC
  Filled 2014-05-18: qty 10

## 2014-05-18 MED ORDER — ONDANSETRON HCL 4 MG PO TABS: 4.0000 mg | ORAL_TABLET | Freq: Four times a day (QID) | ORAL | Status: DC | PRN

## 2014-05-18 MED ORDER — METOCLOPRAMIDE HCL 10 MG PO TABS: 5.0000 mg | ORAL_TABLET | Freq: Three times a day (TID) | ORAL | Status: DC | PRN

## 2014-05-18 MED ORDER — METOPROLOL SUCCINATE ER 50 MG PO TB24
50.0000 mg | ORAL_TABLET | Freq: Once | ORAL | Status: AC
  Administered 2014-05-18: 50 mg via ORAL
  Filled 2014-05-18: qty 1

## 2014-05-18 MED ORDER — HYDROCODONE-ACETAMINOPHEN 7.5-325 MG PO TABS
1.0000 | ORAL_TABLET | ORAL | Status: DC
  Administered 2014-05-18: 1 via ORAL
  Administered 2014-05-18: 2 via ORAL
  Administered 2014-05-18 – 2014-05-19 (×3): 1 via ORAL
  Filled 2014-05-18: qty 2
  Filled 2014-05-18 (×2): qty 1
  Filled 2014-05-18: qty 2
  Filled 2014-05-18 (×2): qty 1

## 2014-05-18 MED ORDER — POTASSIUM CHLORIDE 2 MEQ/ML IV SOLN
100.0000 mL/h | INTRAVENOUS | Status: DC
  Administered 2014-05-18: 100 mL/h via INTRAVENOUS
  Filled 2014-05-18 (×5): qty 1000

## 2014-05-18 MED ORDER — FUROSEMIDE 20 MG PO TABS
20.0000 mg | ORAL_TABLET | Freq: Every day | ORAL | Status: DC | PRN
  Filled 2014-05-18: qty 1

## 2014-05-18 MED ORDER — CEFAZOLIN SODIUM-DEXTROSE 2-3 GM-% IV SOLR
INTRAVENOUS | Status: AC
  Filled 2014-05-18: qty 50

## 2014-05-18 MED ORDER — LACTATED RINGERS IV SOLN: INTRAVENOUS | Status: DC

## 2014-05-18 MED ORDER — EPHEDRINE SULFATE 50 MG/ML IJ SOLN
INTRAMUSCULAR | Status: DC | PRN
  Administered 2014-05-18 (×3): 5 mg via INTRAVENOUS
  Administered 2014-05-18: 10 mg via INTRAVENOUS
  Administered 2014-05-18 (×2): 5 mg via INTRAVENOUS

## 2014-05-18 MED ORDER — MIDAZOLAM HCL 5 MG/5ML IJ SOLN
INTRAMUSCULAR | Status: DC | PRN
  Administered 2014-05-18: 2 mg via INTRAVENOUS

## 2014-05-18 MED ORDER — HYDROMORPHONE HCL 1 MG/ML IJ SOLN: 0.5000 mg | INTRAMUSCULAR | Status: DC | PRN

## 2014-05-18 MED ORDER — HYDROMORPHONE HCL 1 MG/ML IJ SOLN
0.2500 mg | INTRAMUSCULAR | Status: DC | PRN
  Administered 2014-05-18 (×2): 0.25 mg via INTRAVENOUS

## 2014-05-18 MED ORDER — TRANEXAMIC ACID 100 MG/ML IV SOLN
1000.0000 mg | Freq: Once | INTRAVENOUS | Status: AC
  Administered 2014-05-18: 1000 mg via INTRAVENOUS
  Filled 2014-05-18: qty 10

## 2014-05-18 SURGICAL SUPPLY — 38 items
BAG ZIPLOCK 12X15 (MISCELLANEOUS) IMPLANT
CAPT HIP TOTAL 2 ×2 IMPLANT
COVER PERINEAL POST (MISCELLANEOUS) ×2 IMPLANT
DERMABOND ADVANCED (GAUZE/BANDAGES/DRESSINGS) ×1
DERMABOND ADVANCED .7 DNX12 (GAUZE/BANDAGES/DRESSINGS) ×1 IMPLANT
DRAPE C-ARM 42X120 X-RAY (DRAPES) ×2 IMPLANT
DRAPE STERI IOBAN 125X83 (DRAPES) ×2 IMPLANT
DRAPE U-SHAPE 47X51 STRL (DRAPES) ×6 IMPLANT
DRSG AQUACEL AG ADV 3.5X10 (GAUZE/BANDAGES/DRESSINGS) ×2 IMPLANT
DURAPREP 26ML APPLICATOR (WOUND CARE) ×2 IMPLANT
ELECT BLADE TIP CTD 4 INCH (ELECTRODE) ×2 IMPLANT
ELECT PENCIL ROCKER SW 15FT (MISCELLANEOUS) IMPLANT
ELECT REM PT RETURN 15FT ADLT (MISCELLANEOUS) IMPLANT
ELECT REM PT RETURN 9FT ADLT (ELECTROSURGICAL) ×2
ELECTRODE REM PT RTRN 9FT ADLT (ELECTROSURGICAL) ×1 IMPLANT
FACESHIELD WRAPAROUND (MASK) ×8 IMPLANT
GLOVE BIOGEL PI IND STRL 7.5 (GLOVE) ×1 IMPLANT
GLOVE BIOGEL PI IND STRL 8.5 (GLOVE) ×1 IMPLANT
GLOVE BIOGEL PI INDICATOR 7.5 (GLOVE) ×1
GLOVE BIOGEL PI INDICATOR 8.5 (GLOVE) ×1
GLOVE ECLIPSE 8.0 STRL XLNG CF (GLOVE) ×4 IMPLANT
GLOVE ORTHO TXT STRL SZ7.5 (GLOVE) ×2 IMPLANT
GOWN SPEC L3 XXLG W/TWL (GOWN DISPOSABLE) ×2 IMPLANT
GOWN STRL REUS W/TWL LRG LVL3 (GOWN DISPOSABLE) ×2 IMPLANT
HOLDER FOLEY CATH W/STRAP (MISCELLANEOUS) ×2 IMPLANT
KIT BASIN OR (CUSTOM PROCEDURE TRAY) ×2 IMPLANT
LIQUID BAND (GAUZE/BANDAGES/DRESSINGS) ×2 IMPLANT
PACK TOTAL JOINT (CUSTOM PROCEDURE TRAY) ×2 IMPLANT
SAW OSC TIP CART 19.5X105X1.3 (SAW) ×2 IMPLANT
SUT MNCRL AB 4-0 PS2 18 (SUTURE) ×2 IMPLANT
SUT VIC AB 1 CT1 36 (SUTURE) ×6 IMPLANT
SUT VIC AB 2-0 CT1 27 (SUTURE) ×2
SUT VIC AB 2-0 CT1 TAPERPNT 27 (SUTURE) ×2 IMPLANT
SUT VLOC 180 0 24IN GS25 (SUTURE) ×2 IMPLANT
TOWEL OR 17X26 10 PK STRL BLUE (TOWEL DISPOSABLE) ×2 IMPLANT
TOWEL OR NON WOVEN STRL DISP B (DISPOSABLE) IMPLANT
TRAY FOLEY CATH 14FRSI W/METER (CATHETERS) ×2 IMPLANT
WATER STERILE IRR 1500ML POUR (IV SOLUTION) ×2 IMPLANT

## 2014-05-18 NOTE — Interval H&P Note (Signed)
History and Physical Interval Note:  05/18/2014 6:58 AM  [name]  has presented today for surgery, with the diagnosis of RIGHT HIP OA  The various methods of treatment have been discussed with the patient and family. After consideration of risks, benefits and other options for treatment, the patient has consented to  Procedure(s): RIGHT TOTAL HIP ARTHROPLASTY ANTERIOR APPROACH (Right) as a surgical intervention .  The patient's history has been reviewed, patient examined, no change in status, stable for surgery.  I have reviewed the patient's chart and labs.  Questions were answered to the patient's satisfaction.     [name]

## 2014-05-18 NOTE — Anesthesia Procedure Notes (Signed)
Spinal Patient location during procedure: OR Start time: 05/18/2014 7:23 AM End time: 05/18/2014 7:29 AM Staffing Anesthesiologist: Peyton Najjar Resident/CRNA: Darlys Gales R Performed by: resident/CRNA  Preanesthetic Checklist Completed: patient identified, site marked, surgical consent, pre-op evaluation, timeout performed, IV checked, risks and benefits discussed and monitors and equipment checked Spinal Block Patient position: sitting Prep: Betadine Patient monitoring: heart rate, cardiac monitor, continuous pulse ox and blood pressure Approach: midline Location: L3-4 Injection technique: single-shot Needle Needle type: Sprotte  Needle gauge: 24 G Needle length: 9 cm Needle insertion depth: 7.5 cm Assessment Sensory level: T6

## 2014-05-18 NOTE — Progress Notes (Signed)
Utilization review completed.  

## 2014-05-18 NOTE — Progress Notes (Signed)
Patient stated she has been treated for a sinus infection by her PCP. Saw him yesterday and he told her she was ok for surgery.

## 2014-05-18 NOTE — Anesthesia Postprocedure Evaluation (Signed)
  Anesthesia Post-op Note  Patient: [name]  Procedure(s) Performed: Procedure(s) (LRB): RIGHT TOTAL HIP ARTHROPLASTY ANTERIOR APPROACH (Right)  Patient Location: PACU  Anesthesia Type: Spinal  Level of Consciousness: awake and alert   Airway and Oxygen Therapy: Patient Spontanous Breathing  Post-op Pain: mild  Post-op Assessment: Post-op Vital signs reviewed, Patient's Cardiovascular Status Stable, Respiratory Function Stable, Patent Airway and No signs of Nausea or vomiting  Last Vitals:  Filed Vitals:   05/18/14 1500  BP: 116/48  Pulse: 80  Temp:   Resp:     Post-op Vital Signs: stable   Complications: No apparent anesthesia complications

## 2014-05-18 NOTE — Transfer of Care (Signed)
Immediate Anesthesia Transfer of Care Note  Patient: [name]  Procedure(s) Performed: Procedure(s) (LRB): RIGHT TOTAL HIP ARTHROPLASTY ANTERIOR APPROACH (Right)  Patient Location: PACU  Anesthesia Type: Spinal  Level of Consciousness: sedated, patient cooperative and responds to stimulation  Airway & Oxygen Therapy: Patient Spontanous Breathing and Patient connected to face mask oxgen  Post-op Assessment: Report given to PACU RN and Post -op Vital signs reviewed and stable  Post vital signs: Reviewed and stable  Complications: No apparent anesthesia complications

## 2014-05-18 NOTE — Op Note (Signed)
NAME:  [name]                ACCOUNT NO.: [id number]      MEDICAL RECORD NO.: [id number]      FACILITY:  [facility]      PHYSICIAN:  Paralee Cancel D  DATE OF BIRTH:  1936-04-03     DATE OF PROCEDURE:  05/18/2014                                 OPERATIVE REPORT         PREOPERATIVE DIAGNOSIS: Right  hip osteoarthritis.      POSTOPERATIVE DIAGNOSIS:  Right hip osteoarthritis.      PROCEDURE:  Right total hip replacement through an anterior approach   utilizing DePuy THR system, component size 73mm pinnacle cup, a size 36+4 neutral   Altrex liner, a size 3 Hi Tri Lock stem with a 36+1.5 delta ceramic   ball.      SURGEON:  Pietro Cassis. Alvan Dame, M.D.      ASSISTANT:  Danae Orleans, PA-C      ANESTHESIA:  Spinal.      SPECIMENS:  None.      COMPLICATIONS:  None.      BLOOD LOSS:  200 cc     DRAINS:  None.      INDICATION OF THE PROCEDURE:  [name] is a 79 y.o. female who had   presented to office for evaluation of right hip pain.  Radiographs revealed   progressive degenerative changes with bone-on-bone   articulation to the  hip joint.  The patient had painful limited range of   motion significantly affecting their overall quality of life.  The patient was failing to    respond to conservative measures, and at this point was ready   to proceed with more definitive measures.  The patient has noted progressive   degenerative changes in his hip, progressive problems and dysfunction   with regarding the hip prior to surgery.  Consent was obtained for   benefit of pain relief.  Specific risk of infection, DVT, component   failure, dislocation, need for revision surgery, as well discussion of   the anterior versus posterior approach were reviewed.  Consent was   obtained for benefit of anterior pain relief through an anterior   approach.      PROCEDURE IN DETAIL:  The patient was brought to operative theater.   Once adequate anesthesia, preoperative  antibiotics, 2gm of Ancef administered.   The patient was positioned supine on the OSI Hanna table.  Once adequate   padding of boney process was carried out, we had predraped out the hip, and  used fluoroscopy to confirm orientation of the pelvis and position.      The right hip was then prepped and draped from proximal iliac crest to   mid thigh with shower curtain technique.      Time-out was performed identifying the patient, planned procedure, and   extremity.     An incision was then made 2 cm distal and lateral to the   anterior superior iliac spine extending over the orientation of the   tensor fascia lata muscle and sharp dissection was carried down to the   fascia of the muscle and protractor placed in the soft tissues.      The fascia was then incised.  The muscle belly was identified and  swept   laterally and retractor placed along the superior neck.  Following   cauterization of the circumflex vessels and removing some pericapsular   fat, a second cobra retractor was placed on the inferior neck.  A third   retractor was placed on the anterior acetabulum after elevating the   anterior rectus.  A L-capsulotomy was along the line of the   superior neck to the trochanteric fossa, then extended proximally and   distally.  Tag sutures were placed and the retractors were then placed   intracapsular.  We then identified the trochanteric fossa and   orientation of my neck cut, confirmed this radiographically   and then made a neck osteotomy with the femur on traction.  The femoral   head was removed without difficulty or complication.  Traction was let   off and retractors were placed posterior and anterior around the   acetabulum.      The labrum and foveal tissue were debrided.  I began reaming with a 52mm   reamer and reamed up to 47mm reamer with good bony bed preparation and a 48mm   cup was chosen.  The final 48mm Pinnacle cup was then impacted under fluoroscopy  to confirm  the depth of penetration and orientation with respect to   abduction.  A screw was placed followed by the hole eliminator.  The final   36+4 neutral Altrex liner was impacted with good visualized rim fit.  The cup was positioned anatomically within the acetabular portion of the pelvis.      At this point, the femur was rolled at 80 degrees.  Further capsule was   released off the inferior aspect of the femoral neck.  I then   released the superior capsule proximally.  The hook was placed laterally   along the femur and elevated manually and held in position with the bed   hook.  The leg was then extended and adducted with the leg rolled to 100   degrees of external rotation.  Once the proximal femur was fully   exposed, I used a box osteotome to set orientation.  I then began   broaching with the starting chili pepper broach and passed this by hand and then broached up to 3.  With the 3 broach in place I chose a high offset neck and did a trial reduction.  The offset was appropriate, leg lengths   appeared to be equal, confirmed radiographically.   Given these findings, I went ahead and dislocated the hip, repositioned all   retractors and positioned the right hip in the extended and abducted position.  The final 3 Hi Tri Lock stem was   chosen and it was impacted down to the level of neck cut.  Based on this   and the trial reduction, a 36+1.5 delta ceramic ball was chosen and   impacted onto a clean and dry trunnion, and the hip was reduced.  The   hip had been irrigated throughout the case again at this point.  I did   reapproximate the superior capsular leaflet to the anterior leaflet   using #1 Vicryl.  The fascia of the   tensor fascia lata muscle was then reapproximated using #1 Vicryl.  The   remaining wound was closed with 2-0 Vicryl and running 4-0 Monocryl.   The hip was cleaned, dried, and dressed sterilely using Dermabond and   Aquacel dressing.  She was then brought   to  recovery room in  stable condition tolerating the procedure well.    Danae Orleans, PA-C was present for the entirety of the case involved from   preoperative positioning, perioperative retractor management, general   facilitation of the case, as well as primary wound closure as assistant.            Pietro Cassis Alvan Dame, M.D.        05/18/2014 8:39 AM

## 2014-05-19 LAB — BASIC METABOLIC PANEL
ANION GAP: 6 (ref 5–15)
BUN: 14 mg/dL (ref 6–23)
CALCIUM: 8.6 mg/dL (ref 8.4–10.5)
CHLORIDE: 107 meq/L (ref 96–112)
CO2: 27 mmol/L (ref 19–32)
Creatinine, Ser: 0.67 mg/dL (ref 0.50–1.10)
GFR calc Af Amer: >90 mL/min (ref >90)
GFR calc non Af Amer: 83 mL/min — ABNORMAL LOW (ref >90)
Glucose, Bld: 115 mg/dL — ABNORMAL HIGH (ref 70–99)
Potassium: 4.5 mmol/L (ref 3.5–5.1)
Sodium: 140 mmol/L (ref 135–145)

## 2014-05-19 LAB — CBC
HCT: 35.4 % — ABNORMAL LOW (ref 36.0–46.0)
Hemoglobin: 11.2 g/dL — ABNORMAL LOW (ref 12.0–15.0)
MCH: 28.4 pg (ref 26.0–34.0)
MCHC: 31.6 g/dL (ref 30.0–36.0)
MCV: 89.8 fL (ref 78.0–100.0)
PLATELETS: 181 10*3/uL (ref 150–400)
RBC: 3.94 MIL/uL (ref 3.87–5.11)
RDW: 13.7 % (ref 11.5–15.5)
WBC: 8.2 10*3/uL (ref 4.0–10.5)

## 2014-05-19 MED ORDER — HYDROCODONE-ACETAMINOPHEN 7.5-325 MG PO TABS: 1.0000 | ORAL_TABLET | ORAL | Status: DC | PRN

## 2014-05-19 MED ORDER — TIZANIDINE HCL 4 MG PO TABS: 4.0000 mg | ORAL_TABLET | Freq: Four times a day (QID) | ORAL | Status: DC | PRN

## 2014-05-19 MED ORDER — DSS 100 MG PO CAPS: 100.0000 mg | ORAL_CAPSULE | Freq: Two times a day (BID) | ORAL | Status: DC

## 2014-05-19 MED ORDER — FERROUS SULFATE 325 (65 FE) MG PO TABS: 325.0000 mg | ORAL_TABLET | Freq: Three times a day (TID) | ORAL | Status: DC

## 2014-05-19 MED ORDER — POLYETHYLENE GLYCOL 3350 17 G PO PACK: 17.0000 g | PACK | Freq: Two times a day (BID) | ORAL | Status: DC

## 2014-05-19 NOTE — Progress Notes (Signed)
Physical Therapy Treatment Patient Details Name: [name] MRN: [id number] DOB: [birth year] Today's Date: 05/19/2014    History of Present Illness s/p R DA THA    PT Comments    Pt progressing but slowly - ltd by nausea.  Reviewed stairs this pm.  Pt up/down 4 with RW bkwd but unable to practice with cane and rail 2* nausea. Pt states still wanting to dc - RN aware.  Follow Up Recommendations  Home health PT     Equipment Recommendations  None recommended by PT    Recommendations for Other Services OT consult     Precautions / Restrictions Precautions Precautions: Fall Restrictions Weight Bearing Restrictions: No    Mobility  Bed Mobility Overal bed mobility: Needs Assistance Bed Mobility: Supine to Sit     Supine to sit: Min assist     General bed mobility comments: cues for sequence and use of L LE to assist  Transfers Overall transfer level: Needs assistance Equipment used: Rolling walker (2 wheeled) Transfers: Sit to/from Stand Sit to Stand: Min guard         General transfer comment: cues for transition position, LE management and use of UEs to self assist  Ambulation/Gait Ambulation/Gait assistance: Min guard;Supervision Ambulation Distance (Feet): 150 Feet Assistive device: Rolling walker (2 wheeled) Gait Pattern/deviations: Step-through pattern;Shuffle;Trunk flexed Gait velocity: dec   General Gait Details: cues for posture, position from RW and ER on R   Stairs Stairs: Yes Stairs assistance: Min assist Stair Management: No rails;Step to pattern;Backwards;With walker Number of Stairs: 4 General stair comments: cues for sequence and foot/RW placement.  Husband assisting on decent.  Reviewed stairs with cane and rail twice but pt unwilling to attempt 2* to nausea.  Written instructions provided to pt and spouse.  Wheelchair Mobility    Modified Rankin (Stroke Patients Only)       Balance                                     Cognition Arousal/Alertness: Awake/alert Behavior During Therapy: WFL for tasks assessed/performed Overall Cognitive Status: Within Functional Limits for tasks assessed                      Exercises      General Comments        Pertinent Vitals/Pain Pain Assessment: 0-10 Pain Score: 3  Pain Location: R hip Pain Descriptors / Indicators: Aching;Sore Pain Intervention(s): Limited activity within patient's tolerance;Monitored during session;Premedicated before session;Ice applied    Home Living                      Prior Function            PT Goals (current goals can now be found in the care plan section) Acute Rehab PT Goals Patient Stated Goal: return to being independent PT Goal Formulation: With patient Time For Goal Achievement: 05/22/14 Potential to Achieve Goals: Good Progress towards PT goals: Progressing toward goals    Frequency  7X/week    PT Plan Current plan remains appropriate    Co-evaluation             End of Session Equipment Utilized During Treatment: Gait belt Activity Tolerance: Other (comment) (ltd by nausea) Patient left: in chair;with call bell/phone within reach;with family/visitor present     Time: 1430-1505 PT Time Calculation (min) (ACUTE ONLY): 35 min  Charges:  $Gait Training: 8-22 mins $Therapeutic Activity: 8-22 mins                    G Codes:      Markisha Meding 06-07-14, 4:32 PM

## 2014-05-19 NOTE — Progress Notes (Signed)
     Subjective: 1 Day Post-Op Procedure(s) (LRB): RIGHT TOTAL HIP ARTHROPLASTY ANTERIOR APPROACH (Right)   Patient reports pain as mild, pain controlled. No events throughout the night. Ready to be discharged home if she does well with PT and pain stays controlled.  Objective:   VITALS:   Filed Vitals:   05/19/14  BP: 111/47  Pulse: 75  Temp: 98.4 F (36.9 C)   Resp: 16    Dorsiflexion/Plantar flexion intact Incision: dressing C/D/I No cellulitis present Compartment soft  LABS  Recent Labs  05/19/14 0430  HGB 11.2*  HCT 35.4*  WBC 8.2  PLT 181     Recent Labs  05/19/14 0430  NA 140  K 4.5  BUN 14  CREATININE 0.67  GLUCOSE 115*     Assessment/Plan: 1 Day Post-Op Procedure(s) (LRB): RIGHT TOTAL HIP ARTHROPLASTY ANTERIOR APPROACH (Right) Foley cath d/c'ed Advance diet Up with therapy D/C IV fluids Discharge home with home health  Follow up in 2 weeks at [facility]. Follow up with OLIN,Marijah Larranaga D in 2 weeks.  Contact information:  [facility] [address] [place] Mag[place] Pollina   PAC  05/19/2014, 9:47 AM

## 2014-05-19 NOTE — Evaluation (Signed)
Physical Therapy Evaluation Patient Details Name: [name] MRN: [id number] DOB: 08/19/1935 Today's Date: 05/19/2014   History of Present Illness  s/p R DA THA  Clinical Impression  Pt s/p R THR presents with decreased R LE strength/ROM and post op pain limiting functional mobility.  Pt should progress well to dc home with family assist and HHPT follow up.    Follow Up Recommendations Home health PT    Equipment Recommendations  None recommended by PT    Recommendations for Other Services OT consult     Precautions / Restrictions Precautions Precautions: Fall Restrictions Weight Bearing Restrictions: No      Mobility  Bed Mobility Overal bed mobility: Needs Assistance Bed Mobility: Sit to Supine       Sit to supine: Min assist   General bed mobility comments: cues for sequence and use of L LE to assist  Transfers Overall transfer level: Needs assistance Equipment used: Rolling walker (2 wheeled) Transfers: Sit to/from Stand Sit to Stand: Min guard         General transfer comment: cues for transition position, LE management and use of UEs to self assist  Ambulation/Gait Ambulation/Gait assistance: Min guard Ambulation Distance (Feet): 200 Feet Assistive device: Rolling walker (2 wheeled) Gait Pattern/deviations: Step-to pattern;Step-through pattern;Shuffle;Decreased step length - right;Decreased step length - left;Trunk flexed     General Gait Details: cues for posture, position from RW and initial sequence  Stairs            Wheelchair Mobility    Modified Rankin (Stroke Patients Only)       Balance                                             Pertinent Vitals/Pain Pain Assessment: 0-10 Pain Score: 3  Pain Location: R hip/thigh Pain Descriptors / Indicators: Aching Pain Intervention(s): Limited activity within patient's tolerance;Monitored during session;Premedicated before session;Ice applied    Home Living  Family/patient expects to be discharged to:: Private residence Living Arrangements: Spouse/significant other Available Help at Discharge: Family Type of Home: House Home Access: Stairs to enter Entrance Stairs-Rails: None Entrance Stairs-Number of Steps: 3 Home Layout: Two level Home Equipment: Environmental consultant - 2 wheels;Cane - single point Additional Comments: pt has a high tub; will likely sponge bathe.  Their log house, out back has a walk in shower    Prior Function Level of Independence: Independent               Hand Dominance   Dominant Hand: Right    Extremity/Trunk Assessment   Upper Extremity Assessment: Overall WFL for tasks assessed           Lower Extremity Assessment: RLE deficits/detail RLE Deficits / Details: 2+/5 hip strength with AAROM at hip to 90 flex and 20 abd       Communication   Communication: No difficulties  Cognition Arousal/Alertness: Awake/alert Behavior During Therapy: WFL for tasks assessed/performed Overall Cognitive Status: Within Functional Limits for tasks assessed                      General Comments      Exercises Total Joint Exercises Ankle Circles/Pumps: AROM;Both;15 reps;Supine Quad Sets: AROM;Both;10 reps;Supine Heel Slides: AAROM;15 reps;Supine;Right Hip ABduction/ADduction: AAROM;Right;10 reps;Supine      Assessment/Plan    PT Assessment Patient needs continued PT services  PT Diagnosis  Difficulty walking   PT Problem List Decreased strength;Decreased range of motion;Decreased activity tolerance;Decreased mobility;Decreased knowledge of use of DME;Pain  PT Treatment Interventions DME instruction;Gait training;Stair training;Functional mobility training;Therapeutic activities;Therapeutic exercise;Patient/family education   PT Goals (Current goals can be found in the Care Plan section) Acute Rehab PT Goals Patient Stated Goal: return to being independent PT Goal Formulation: With patient Time For Goal  Achievement: 05/22/14 Potential to Achieve Goals: Good    Frequency 7X/week   Barriers to discharge        Co-evaluation               End of Session Equipment Utilized During Treatment: Gait belt Activity Tolerance: Patient tolerated treatment well Patient left: in bed;with call bell/phone within reach Nurse Communication: Mobility status         Time: 2725-3664 PT Time Calculation (min) (ACUTE ONLY): 36 min   Charges:   PT Evaluation $Initial PT Evaluation Tier I: 1 Procedure PT Treatments $Gait Training: 8-22 mins $Therapeutic Exercise: 8-22 mins   PT G Codes:        Latonia Conrow 2014/05/20, 1:01 PM

## 2014-05-19 NOTE — Care Management Note (Signed)
    Page 1 of 2   05/19/2014     10:12:08 AM CARE MANAGEMENT NOTE 05/19/2014  Patient:  JANNAE, FAGERSTROM   Account Number:  [id number]  Date Initiated:  05/19/2014  Documentation initiated by:  [facility]  Subjective/Objective Assessment:   adm: RIGHT TOTAL HIP ARTHROPLASTY ANTERIOR APPROACH (Right)     Action/Plan:   discharge planning   Anticipated DC Date:  05/19/2014   Anticipated DC Plan:  [place]  CM consult      [facility] Choice  HOME HEALTH   Choice offered to / List presented to:  C-1 Patient   DME arranged  NA      DME agency  NA     [place] arranged  HH-2 PT      [place]   Status of service:  Completed, signed off Medicare Important Message given?   (If response is "NO", the following Medicare IM given date fields will be blank) Date Medicare IM given:   Medicare IM given by:   Date Additional Medicare IM given:   Additional Medicare IM given by:    Discharge Disposition:  [place]  Per UR Regulation:    If discussed at Long Length of Stay Meetings, dates discussed:    Comments:  05/19/14 08:30 CM met with pt in room to offer choice of home health agency.  Pt chooses [facility] to render HHPT.  No oDME is needed as pt has both a walker and commode at home. Address and contact information verified by pt.  Referral given to [employer], Tim.  No other CM needs were communicated.  Mariane Masters, BSN, CM [phone].

## 2014-05-19 NOTE — Evaluation (Signed)
Occupational Therapy Evaluation Patient Details Name: EDIT RICCIARDELLI MRN: [id number] DOB: 11-06-1935 Today's Date: 05/19/2014    History of Present Illness s/p R DA THA   Clinical Impression   This 79 year old female was admitted for the above surgery.  She will benefit from skilled OT to increase safety and independence with adls/bathroom transfers.  Pt currently needs min to mod A.  She was independent prior to admission and goals set are for min guard to supervision    Follow Up Recommendations  Supervision/Assistance - 24 hour    Equipment Recommendations  None recommended by OT    Recommendations for Other Services       Precautions / Restrictions Precautions Precautions: Fall Restrictions Weight Bearing Restrictions: No      Mobility Bed Mobility Overal bed mobility: Needs Assistance Bed Mobility: Supine to Sit     Supine to sit: Min assist     General bed mobility comments: from flat bed  Transfers Overall transfer level: Needs assistance Equipment used: Rolling walker (2 wheeled) Transfers: Sit to/from Stand Sit to Stand: Min assist         General transfer comment: cues for UE/LE placement    Balance                                            ADL Overall ADL's : Needs assistance/impaired     Grooming: Supervision/safety;Standing;Oral care   Upper Body Bathing: Set up;Sitting   Lower Body Bathing: Minimal assistance;Sit to/from stand;With adaptive equipment   Upper Body Dressing : Set up;Sitting   Lower Body Dressing: Moderate assistance;With adaptive equipment;Sit to/from stand   Toilet Transfer: Minimal assistance;Stand-pivot (to recliner)             General ADL Comments: Pt still has catheter.  She has a Secondary school teacher at home:  explained/demonstrated uses. Educated on sock aide, but pt will have her husband assist.  Educated on shower transfer, if she uses shower in other house, but recommended she doesn't try it today  and weather may be an issue getting to other shower.  Pt is fine to sponge bathe.  Her tub has doors--she used to have seat.  Demonstrated and educated on tub readiness     Vision                     Perception     Praxis      Pertinent Vitals/Pain Pain Assessment: 0-10 Pain Score: 5  Pain Location: R hip Pain Descriptors / Indicators: Aching Pain Intervention(s): Limited activity within patient's tolerance;Premedicated before session;Monitored during session;Ice applied     Hand Dominance Right   Extremity/Trunk Assessment Upper Extremity Assessment Upper Extremity Assessment: Overall WFL for tasks assessed (decreased Hurt RUE from previous accident)           Communication Communication Communication: No difficulties   Cognition Arousal/Alertness: Awake/alert Behavior During Therapy: WFL for tasks assessed/performed Overall Cognitive Status: Within Functional Limits for tasks assessed                     General Comments       Exercises       Shoulder Instructions      Home Living Family/patient expects to be discharged to:: Private residence Living Arrangements: Spouse/significant other  Bathroom Shower/Tub: Risk analyst characteristics: Architectural technologist: Standard     Home Equipment: Bedside commode   Additional Comments: pt has a high tub; will likely sponge bathe.  Their log house, out back has a walk in shower      Prior Functioning/Environment Level of Independence: Independent             OT Diagnosis: Generalized weakness   OT Problem List: Decreased strength;Decreased activity tolerance;Decreased knowledge of use of DME or AE;Pain   OT Treatment/Interventions: Self-care/ADL training;Energy conservation;Patient/family education    OT Goals(Current goals can be found in the care plan section) Acute Rehab OT Goals Patient Stated Goal: return to being independent OT Goal  Formulation: With patient Time For Goal Achievement: 05/26/14 Potential to Achieve Goals: Good ADL Goals Pt Will Transfer to Toilet: with min guard assist;ambulating;bedside commode Pt Will Perform Toileting - Clothing Manipulation and hygiene: with supervision;sit to/from stand Additional ADL Goal #1: Pt will don pants/underwear with reacher and supervisioin, sit to stand  OT Frequency: Min 2X/week   Barriers to D/C:            Co-evaluation              End of Session    Activity Tolerance: Patient tolerated treatment well Patient left: in chair;with call bell/phone within reach   Time: 0833-0908 OT Time Calculation (min): 35 min Charges:  OT General Charges $OT Visit: 1 Procedure OT Evaluation $Initial OT Evaluation Tier I: 1 Procedure OT Treatments $Self Care/Home Management : 23-37 mins G-Codes:    Tashauna Caisse 2014-06-05, 9:33 AM Lesle Chris, OTR/L [phone] Jun 05, 2014

## 2014-05-20 DIAGNOSIS — M7551 Bursitis of right shoulder: Secondary | ICD-10-CM | POA: Diagnosis not present

## 2014-05-20 DIAGNOSIS — I480 Paroxysmal atrial fibrillation: Secondary | ICD-10-CM | POA: Diagnosis not present

## 2014-05-20 DIAGNOSIS — M19031 Primary osteoarthritis, right wrist: Secondary | ICD-10-CM | POA: Diagnosis not present

## 2014-05-20 DIAGNOSIS — Z471 Aftercare following joint replacement surgery: Secondary | ICD-10-CM | POA: Diagnosis not present

## 2014-05-20 DIAGNOSIS — M791 Myalgia: Secondary | ICD-10-CM | POA: Diagnosis not present

## 2014-05-20 DIAGNOSIS — D869 Sarcoidosis, unspecified: Secondary | ICD-10-CM | POA: Diagnosis not present

## 2014-05-24 DIAGNOSIS — Z471 Aftercare following joint replacement surgery: Secondary | ICD-10-CM | POA: Diagnosis not present

## 2014-05-24 DIAGNOSIS — M19031 Primary osteoarthritis, right wrist: Secondary | ICD-10-CM | POA: Diagnosis not present

## 2014-05-24 DIAGNOSIS — D869 Sarcoidosis, unspecified: Secondary | ICD-10-CM | POA: Diagnosis not present

## 2014-05-24 DIAGNOSIS — I480 Paroxysmal atrial fibrillation: Secondary | ICD-10-CM | POA: Diagnosis not present

## 2014-05-24 DIAGNOSIS — M791 Myalgia: Secondary | ICD-10-CM | POA: Diagnosis not present

## 2014-05-24 DIAGNOSIS — M7551 Bursitis of right shoulder: Secondary | ICD-10-CM | POA: Diagnosis not present

## 2014-05-24 NOTE — Discharge Summary (Signed)
Physician Discharge Summary  Patient ID: [name] MRN: [id number] DOB/AGE: 1936/02/27 79 y.o.  Admit date: 05/18/2014 Discharge date: 05/19/2014   Procedures:  Procedure(s) (LRB): RIGHT TOTAL HIP ARTHROPLASTY ANTERIOR APPROACH (Right)  Attending Physician:  Dr. Paralee Cancel   Admission Diagnoses:   Right hip primary OA / pain  Discharge Diagnoses:  Principal Problem:   S/P right THA, AA  Past Medical History  Diagnosis Date  . Hyperlipidemia   . Fracture     at l1  . Fracture     right arm x 2  . Tachyarrhythmia   . Benign tumor of pleura 2007  . Seasonal allergies   . Arthritis   . Bursitis   . Cataracts, bilateral   . Sarcoidosis     As a child  . Dysrhythmia     atrial fib  . History of blood transfusion age 60 or 55  . Complication of anesthesia   . PONV (postoperative nausea and vomiting) 48 yrs ago    with ether  . Cough since 05-09-2014    with sore throat    HPI:      [name], 79 y.o. female, has a history of pain and functional disability in the right hip(s) due to arthritis and patient has failed non-surgical conservative treatments for greater than 12 weeks to include NSAID's and/or analgesics and activity modification. Onset of symptoms was gradual starting >10 years ago with gradually worsening course since that time.The patient noted no past surgery on the right hip(s). Patient currently rates pain in the right hip at 10 out of 10 with activity. Patient has night pain, worsening of pain with activity and weight bearing, trendelenberg gait, pain that interfers with activities of daily living and pain with passive range of motion. Patient has evidence of periarticular osteophytes and joint space narrowing by imaging studies. This condition presents safety issues increasing the risk of falls. There is no current active infection. Risks, benefits and expectations were discussed with the patient. Risks including but not limited to the risk of  anesthesia, blood clots, nerve damage, blood vessel damage, failure of the prosthesis, infection and up to and including death. Patient understand the risks, benefits and expectations and wishes to proceed with surgery.   PCP: Precious Reel, MD   Discharged Condition: good  Hospital Course:  Patient underwent the above stated procedure on 05/18/2014. Patient tolerated the procedure well and brought to the recovery room in good condition and subsequently to the floor.  POD #1 BP: 111/47 ; Pulse: 75 ; Temp: 98.4 F (36.9 C) ; Resp: 16 Patient reports pain as mild, pain controlled. No events throughout the night. Ready to be discharged home. Dorsiflexion/plantar flexion intact, incision: dressing C/D/I, no cellulitis present and compartment soft.   LABS  Basename    HGB  11.2  HCT  35.4    Discharge Exam: General appearance: alert, cooperative and no distress Extremities: Homans sign is negative, no sign of DVT, no edema, redness or tenderness in the calves or thighs and no ulcers, gangrene or trophic changes  Disposition: Home with follow up in 2 weeks   Follow-up Information    Follow up with [facility].   Why:  home health physical therapy   Contact information:   [address] [address] [phone]       Follow up with [name], MD. Schedule an appointment as soon as possible for a visit in 2 weeks.  Specialty:  Orthopedic Surgery   Contact information:   [address] [place] [address] [phone]       Discharge Instructions    Call MD / Call 911    Complete by:  As directed   If you experience chest pain or shortness of breath, CALL 911 and be transported to the hospital emergency room.  If you develope a fever above 101 F, pus (white drainage) or increased drainage or redness at the wound, or calf pain, call your surgeon's office.     Change dressing    Complete by:  As directed   Maintain surgical  dressing until follow up in the clinic. If the edges start to pull up, may reinforce with tape. If the dressing is no longer working, may remove and cover with gauze and tape, but must keep the area dry and clean.  Call with any questions or concerns.     Constipation Prevention    Complete by:  As directed   Drink plenty of fluids.  Prune juice may be helpful.  You may use a stool softener, such as Colace (over the counter) 100 mg twice a day.  Use MiraLax (over the counter) for constipation as needed.     Diet - low sodium heart healthy    Complete by:  As directed      Discharge instructions    Complete by:  As directed   Maintain surgical dressing until follow up in the clinic. If the edges start to pull up, may reinforce with tape. If the dressing is no longer working, may remove and cover with gauze and tape, but must keep the area dry and clean.  Follow up in 2 weeks at [facility]. Call with any questions or concerns.     Driving restrictions    Complete by:  As directed   No driving for 4 weeks     Increase activity slowly as tolerated    Complete by:  As directed      TED hose    Complete by:  As directed   Use stockings (TED hose) for 2 weeks on both leg(s).  You may remove them at night for sleeping.     Weight bearing as tolerated    Complete by:  As directed   Laterality:  right  Extremity:  Lower             Medication List    STOP taking these medications        ADVIL PM PO     ibuprofen 200 MG tablet  Commonly known as:  ADVIL,MOTRIN      TAKE these medications        CALCIUM + D PO  Take 1 tablet by mouth daily.     CLARITIN PO  Take 1 tablet by mouth daily.     DSS 100 MG Caps  Take 100 mg by mouth 2 (two) times daily.     ELIQUIS 5 MG Tabs tablet  Generic drug:  apixaban  Take 5 mg by mouth 2 (two) times daily.     ferrous sulfate 325 (65 FE) MG tablet  Take 1 tablet (325 mg total) by mouth 3 (three) times daily after meals.      fish oil-omega-3 fatty acids 1000 MG capsule  Take 1 g by mouth daily.     furosemide 20 MG tablet  Commonly known as:  LASIX  Take 20 mg every other day for 10 days. Then  take once daily as needed for swelling.     GLUCOSAMINE PO  Take 800 mg by mouth daily.     HYDROcodone-acetaminophen 7.5-325 MG per tablet  Commonly known as:  NORCO  Take 1-2 tablets by mouth every 4 (four) hours as needed for moderate pain.     Magnesium 250 MG Tabs  Take 250 mg by mouth daily.     metoprolol succinate 50 MG 24 hr tablet  Commonly known as:  TOPROL-XL  Take 50 mg by mouth every morning. Take with or immediately following a meal.     MUCINEX D PO  Take 2 tablets by mouth every 12 (twelve) hours as needed.     polyethylene glycol packet  Commonly known as:  MIRALAX / GLYCOLAX  Take 17 g by mouth 2 (two) times daily.     pyridOXINE 100 MG tablet  Commonly known as:  VITAMIN B-6  Take 100 mg by mouth daily.     tiZANidine 4 MG tablet  Commonly known as:  ZANAFLEX  Take 1 tablet (4 mg total) by mouth every 6 (six) hours as needed for muscle spasms.         Signed: [place] Pugh. Darroll Bredeson   PA-C  05/24/2014, 2:34 PM

## 2014-05-26 DIAGNOSIS — Z471 Aftercare following joint replacement surgery: Secondary | ICD-10-CM | POA: Diagnosis not present

## 2014-05-26 DIAGNOSIS — M19031 Primary osteoarthritis, right wrist: Secondary | ICD-10-CM | POA: Diagnosis not present

## 2014-05-26 DIAGNOSIS — D869 Sarcoidosis, unspecified: Secondary | ICD-10-CM | POA: Diagnosis not present

## 2014-05-26 DIAGNOSIS — I480 Paroxysmal atrial fibrillation: Secondary | ICD-10-CM | POA: Diagnosis not present

## 2014-05-26 DIAGNOSIS — M791 Myalgia: Secondary | ICD-10-CM | POA: Diagnosis not present

## 2014-05-26 DIAGNOSIS — M7551 Bursitis of right shoulder: Secondary | ICD-10-CM | POA: Diagnosis not present

## 2014-05-28 DIAGNOSIS — M7551 Bursitis of right shoulder: Secondary | ICD-10-CM | POA: Diagnosis not present

## 2014-05-28 DIAGNOSIS — M791 Myalgia: Secondary | ICD-10-CM | POA: Diagnosis not present

## 2014-05-28 DIAGNOSIS — I480 Paroxysmal atrial fibrillation: Secondary | ICD-10-CM | POA: Diagnosis not present

## 2014-05-28 DIAGNOSIS — M19031 Primary osteoarthritis, right wrist: Secondary | ICD-10-CM | POA: Diagnosis not present

## 2014-05-28 DIAGNOSIS — D869 Sarcoidosis, unspecified: Secondary | ICD-10-CM | POA: Diagnosis not present

## 2014-05-28 DIAGNOSIS — Z471 Aftercare following joint replacement surgery: Secondary | ICD-10-CM | POA: Diagnosis not present

## 2014-05-31 DIAGNOSIS — M7551 Bursitis of right shoulder: Secondary | ICD-10-CM | POA: Diagnosis not present

## 2014-05-31 DIAGNOSIS — M19031 Primary osteoarthritis, right wrist: Secondary | ICD-10-CM | POA: Diagnosis not present

## 2014-05-31 DIAGNOSIS — I480 Paroxysmal atrial fibrillation: Secondary | ICD-10-CM | POA: Diagnosis not present

## 2014-05-31 DIAGNOSIS — D869 Sarcoidosis, unspecified: Secondary | ICD-10-CM | POA: Diagnosis not present

## 2014-05-31 DIAGNOSIS — M791 Myalgia: Secondary | ICD-10-CM | POA: Diagnosis not present

## 2014-05-31 DIAGNOSIS — Z471 Aftercare following joint replacement surgery: Secondary | ICD-10-CM | POA: Diagnosis not present

## 2014-06-01 DIAGNOSIS — D869 Sarcoidosis, unspecified: Secondary | ICD-10-CM | POA: Diagnosis not present

## 2014-06-01 DIAGNOSIS — M7551 Bursitis of right shoulder: Secondary | ICD-10-CM | POA: Diagnosis not present

## 2014-06-01 DIAGNOSIS — M19031 Primary osteoarthritis, right wrist: Secondary | ICD-10-CM | POA: Diagnosis not present

## 2014-06-01 DIAGNOSIS — I480 Paroxysmal atrial fibrillation: Secondary | ICD-10-CM | POA: Diagnosis not present

## 2014-06-01 DIAGNOSIS — Z471 Aftercare following joint replacement surgery: Secondary | ICD-10-CM | POA: Diagnosis not present

## 2014-06-01 DIAGNOSIS — M791 Myalgia: Secondary | ICD-10-CM | POA: Diagnosis not present

## 2014-06-02 DIAGNOSIS — Z96641 Presence of right artificial hip joint: Secondary | ICD-10-CM | POA: Diagnosis not present

## 2014-06-02 DIAGNOSIS — Z471 Aftercare following joint replacement surgery: Secondary | ICD-10-CM | POA: Diagnosis not present

## 2014-06-04 DIAGNOSIS — Z471 Aftercare following joint replacement surgery: Secondary | ICD-10-CM | POA: Diagnosis not present

## 2014-06-04 DIAGNOSIS — M7551 Bursitis of right shoulder: Secondary | ICD-10-CM | POA: Diagnosis not present

## 2014-06-04 DIAGNOSIS — M791 Myalgia: Secondary | ICD-10-CM | POA: Diagnosis not present

## 2014-06-04 DIAGNOSIS — I480 Paroxysmal atrial fibrillation: Secondary | ICD-10-CM | POA: Diagnosis not present

## 2014-06-04 DIAGNOSIS — M19031 Primary osteoarthritis, right wrist: Secondary | ICD-10-CM | POA: Diagnosis not present

## 2014-06-04 DIAGNOSIS — D869 Sarcoidosis, unspecified: Secondary | ICD-10-CM | POA: Diagnosis not present

## 2014-07-07 DIAGNOSIS — Z471 Aftercare following joint replacement surgery: Secondary | ICD-10-CM | POA: Diagnosis not present

## 2014-07-07 DIAGNOSIS — Z96641 Presence of right artificial hip joint: Secondary | ICD-10-CM | POA: Diagnosis not present

## 2014-08-12 DIAGNOSIS — Z471 Aftercare following joint replacement surgery: Secondary | ICD-10-CM | POA: Diagnosis not present

## 2014-08-12 DIAGNOSIS — Z96641 Presence of right artificial hip joint: Secondary | ICD-10-CM | POA: Diagnosis not present

## 2014-08-23 ENCOUNTER — Ambulatory Visit (INDEPENDENT_AMBULATORY_CARE_PROVIDER_SITE_OTHER): Payer: Medicare Other | Admitting: Internal Medicine

## 2014-08-23 ENCOUNTER — Encounter: Payer: Self-pay | Admitting: Internal Medicine

## 2014-08-23 VITALS — BP 120/62 | HR 67 | Ht 67.0 in | Wt 153.2 lb

## 2014-08-23 DIAGNOSIS — I480 Paroxysmal atrial fibrillation: Secondary | ICD-10-CM | POA: Diagnosis not present

## 2014-08-23 NOTE — Patient Instructions (Signed)
Medication Instructions:  Your physician recommends that you continue on your current medications as directed. Please refer to the Current Medication list given to you today.  Labwork: None  Testing/Procedures: None  Follow-Up: Your physician wants you to follow-up in: 1 year with Dr. Caryl Comes.  You will receive a reminder letter in the mail two months in advance. If you don't receive a letter, please call our office to schedule the follow-up appointment.   Any Other Special Instructions Will Be Listed Below (If Applicable).

## 2014-08-23 NOTE — Progress Notes (Signed)
Patient Care Team: Shon Baton, MD as PCP - General (Internal Medicine) Gaye Pollack, MD (Cardiothoracic Surgery) Shon Baton, MD as Consulting Physician (Internal Medicine)   HPI  [name] is a 79 y.o. female Seen in followup for exercise associated dizziness and palpitations with history of chest pain Stress echo demonstrated no echocardiographic changes; images were poor. Left ventricular function was normal  CBC and TSH were normal   Event recorder>>atrial fibrillation. Atenolol resulted in palpitations being much improved     Echo stress test 4/13 demonstrated normal left ventricular function with mild MR and moderate MVP    Echocardiogram 4/15 demonstrated normal LV function no significant atrial enlargement (30/1.66) and normal wall thickness  3/15-complaints of dyspnea treated with diuretics which would then be used when necessary  Past Medical History  Diagnosis Date  . Hyperlipidemia   . Fracture     at l1  . Fracture     right arm x 2  . Tachyarrhythmia   . Benign tumor of pleura 2007  . Seasonal allergies   . Arthritis   . Bursitis   . Cataracts, bilateral   . Sarcoidosis     As a child  . Dysrhythmia     atrial fib  . History of blood transfusion age 30 or 60  . Complication of anesthesia   . PONV (postoperative nausea and vomiting) 48 yrs ago    with ether  . Cough since 05-09-2014    with sore throat    Past Surgical History  Procedure Laterality Date  . Left video-assisted thoracoscopy, left thoracotomy with wedge resection of left lower lobe lung mass.  insertion of on-q pain pump  12/03/2005    [facility]  . Appendectomy    . Vaginal hysterectomy    . Abdominal hysterectomy      complete  . Tonsillectomy  as child  . Fracture surgery Right 2007  . Rotator cuff repair Left 2008  . Total hip arthroplasty Right 05/18/2014    Procedure: RIGHT TOTAL HIP ARTHROPLASTY ANTERIOR APPROACH;  Surgeon: [name], MD;  Location: [facility];  Service:  Orthopedics;  Laterality: Right;    Current Outpatient Prescriptions  Medication Sig Dispense Refill  . apixaban (ELIQUIS) 5 MG TABS tablet Take 5 mg by mouth 2 (two) times daily.    . Calcium Carbonate-Vitamin D (CALCIUM + D PO) Take 1 tablet by mouth daily.    Marland Kitchen docusate sodium 100 MG CAPS Take 100 mg by mouth 2 (two) times daily. 10 capsule 0  . fish oil-omega-3 fatty acids 1000 MG capsule Take 1 g by mouth daily.     . furosemide (LASIX) 20 MG tablet Take 20 mg every other day for 10 days. Then take once daily as needed for swelling. (Patient taking differently: Take 20 mg by mouth daily as needed for fluid or edema. Take 20 mg every other day for 10 days. Then take once daily as needed for swelling) 30 tablet 3  . Glucosamine HCl (GLUCOSAMINE PO) Take 800 mg by mouth daily.    . Loratadine (CLARITIN PO) Take 1 tablet by mouth daily.    . Magnesium 250 MG TABS Take 250 mg by mouth daily.    . metoprolol succinate (TOPROL-XL) 50 MG 24 hr tablet Take 50 mg by mouth every morning. Take with or immediately following a meal.    . pyridOXINE (VITAMIN B-6) 100 MG tablet Take 100 mg by mouth daily.    Marland Kitchen  tiZANidine (ZANAFLEX) 4 MG tablet Take 1 tablet (4 mg total) by mouth every 6 (six) hours as needed for muscle spasms. 30 tablet 0   No current facility-administered medications for this visit.    No Known Allergies  Review of Systems negative except from HPI and PMH  Physical Exam BP 120/62 mmHg  Pulse 67  Ht 5\' 7"  (1.702 m)  Wt 153 lb 3.2 oz (69.491 kg)  BMI 23.99 kg/m2 Well developed and well nourished in no acute distress HENT normal E scleral and icterus clear Neck Supple JVP 7-8; carotids brisk and full Clear to ausculation Regular rate and rhythm, no murmurs gallops or rub Soft with active bowel sounds No clubbing cyanosis  No  Edema Alert and oriented, grossly normal motor and sensory function Skin Warm and Dry   ECG today demonstrated sinus rhythm at 67 Intervals  19/08/40 Axis right 61 Inferolateral T wave inversions   Assessment and  Plan  Mitral valve prolapse  Paroxysmal atrial fibrillation  Low-voltage limb leads  HFpEF   Abnormal ECG  Leg pain  She is euvolemic  She has used her diuretics when necessary.   We will continue her on apixaban  T-wave changes on her ECG are concerning; however, she has no symptoms to support ischemia. She's advised to let us know if she has any chest pain or dyspnea  I have been in touch with her primary care physician regarding this issue and the pain issue  She has 2 separate leg pain syndromes. One is creatinine; the other is not. It occurs only while lying down and is relieved by sitting up with you marked spinal stenosis. I've asked her to follow-up with her PCP about this.

## 2014-09-06 ENCOUNTER — Other Ambulatory Visit: Payer: Self-pay | Admitting: Internal Medicine

## 2014-09-20 ENCOUNTER — Other Ambulatory Visit: Payer: Self-pay | Admitting: Internal Medicine

## 2014-09-21 NOTE — Telephone Encounter (Signed)
Per note 4.25.16 

## 2015-01-17 DIAGNOSIS — E559 Vitamin D deficiency, unspecified: Secondary | ICD-10-CM | POA: Diagnosis not present

## 2015-01-17 DIAGNOSIS — R351 Nocturia: Secondary | ICD-10-CM | POA: Diagnosis not present

## 2015-01-17 DIAGNOSIS — I480 Paroxysmal atrial fibrillation: Secondary | ICD-10-CM | POA: Diagnosis not present

## 2015-01-17 DIAGNOSIS — E785 Hyperlipidemia, unspecified: Secondary | ICD-10-CM | POA: Diagnosis not present

## 2015-01-24 DIAGNOSIS — H04129 Dry eye syndrome of unspecified lacrimal gland: Secondary | ICD-10-CM | POA: Diagnosis not present

## 2015-01-24 DIAGNOSIS — N39498 Other specified urinary incontinence: Secondary | ICD-10-CM | POA: Diagnosis not present

## 2015-01-24 DIAGNOSIS — E559 Vitamin D deficiency, unspecified: Secondary | ICD-10-CM | POA: Diagnosis not present

## 2015-01-24 DIAGNOSIS — C4491 Basal cell carcinoma of skin, unspecified: Secondary | ICD-10-CM | POA: Diagnosis not present

## 2015-01-24 DIAGNOSIS — M25551 Pain in right hip: Secondary | ICD-10-CM | POA: Diagnosis not present

## 2015-01-24 DIAGNOSIS — Z23 Encounter for immunization: Secondary | ICD-10-CM | POA: Diagnosis not present

## 2015-01-24 DIAGNOSIS — M1990 Unspecified osteoarthritis, unspecified site: Secondary | ICD-10-CM | POA: Diagnosis not present

## 2015-01-24 DIAGNOSIS — Z0000 Encounter for general adult medical examination without abnormal findings: Secondary | ICD-10-CM | POA: Diagnosis not present

## 2015-01-24 DIAGNOSIS — I480 Paroxysmal atrial fibrillation: Secondary | ICD-10-CM | POA: Diagnosis not present

## 2015-01-24 DIAGNOSIS — K589 Irritable bowel syndrome without diarrhea: Secondary | ICD-10-CM | POA: Diagnosis not present

## 2015-01-24 DIAGNOSIS — K5900 Constipation, unspecified: Secondary | ICD-10-CM | POA: Diagnosis not present

## 2015-01-24 DIAGNOSIS — Z6824 Body mass index (BMI) 24.0-24.9, adult: Secondary | ICD-10-CM | POA: Diagnosis not present

## 2015-01-26 DIAGNOSIS — Z1212 Encounter for screening for malignant neoplasm of rectum: Secondary | ICD-10-CM | POA: Diagnosis not present

## 2015-02-03 DIAGNOSIS — Z471 Aftercare following joint replacement surgery: Secondary | ICD-10-CM | POA: Diagnosis not present

## 2015-02-03 DIAGNOSIS — Z96641 Presence of right artificial hip joint: Secondary | ICD-10-CM | POA: Diagnosis not present

## 2015-03-02 DIAGNOSIS — M25551 Pain in right hip: Secondary | ICD-10-CM | POA: Diagnosis not present

## 2015-03-02 DIAGNOSIS — M7071 Other bursitis of hip, right hip: Secondary | ICD-10-CM | POA: Diagnosis not present

## 2015-03-02 DIAGNOSIS — Z96641 Presence of right artificial hip joint: Secondary | ICD-10-CM | POA: Diagnosis not present

## 2015-03-02 DIAGNOSIS — Z471 Aftercare following joint replacement surgery: Secondary | ICD-10-CM | POA: Diagnosis not present

## 2015-03-07 DIAGNOSIS — L821 Other seborrheic keratosis: Secondary | ICD-10-CM | POA: Diagnosis not present

## 2015-03-07 DIAGNOSIS — L820 Inflamed seborrheic keratosis: Secondary | ICD-10-CM | POA: Diagnosis not present

## 2015-03-07 DIAGNOSIS — Z85828 Personal history of other malignant neoplasm of skin: Secondary | ICD-10-CM | POA: Diagnosis not present

## 2015-04-05 ENCOUNTER — Other Ambulatory Visit: Payer: Self-pay | Admitting: *Deleted

## 2015-04-05 DIAGNOSIS — R918 Other nonspecific abnormal finding of lung field: Secondary | ICD-10-CM

## 2015-04-06 ENCOUNTER — Ambulatory Visit
Admission: RE | Admit: 2015-04-06 | Discharge: 2015-04-06 | Disposition: A | Discharge status: Home / Self Care | Payer: Medicare Other | Source: Ambulatory Visit | Attending: Surgery | Admitting: Surgery

## 2015-04-06 ENCOUNTER — Ambulatory Visit (INDEPENDENT_AMBULATORY_CARE_PROVIDER_SITE_OTHER): Payer: Medicare Other | Admitting: Surgery

## 2015-04-06 ENCOUNTER — Encounter: Payer: Self-pay | Admitting: Surgery

## 2015-04-06 VITALS — BP 127/69 | HR 62 | Resp 16 | Ht 67.0 in | Wt 149.0 lb

## 2015-04-06 DIAGNOSIS — R918 Other nonspecific abnormal finding of lung field: Secondary | ICD-10-CM | POA: Diagnosis not present

## 2015-04-06 DIAGNOSIS — Z8709 Personal history of other diseases of the respiratory system: Secondary | ICD-10-CM

## 2015-04-06 DIAGNOSIS — Z86018 Personal history of other benign neoplasm: Secondary | ICD-10-CM

## 2015-04-06 NOTE — Progress Notes (Signed)
     HPI:   The patient returns to my office today for followup status post left thoracotomy and wedge resection of a left lower lobe lung mass on 12/03/2005. 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 Since I saw her about 1 year ago she has continued to do well. She denies any chest pain. She's had no shortness of breath. She denies any cough or hemoptysis. She had her right hip replaced last January and did well.    Current Outpatient Prescriptions  Medication Sig Dispense Refill  . apixaban (ELIQUIS) 5 MG TABS tablet Take 5 mg by mouth 2 (two) times daily.    . Calcium Carbonate-Vitamin D (CALCIUM + D PO) Take 1 tablet by mouth daily.    Marland Kitchen docusate sodium 100 MG CAPS Take 100 mg by mouth 2 (two) times daily. 10 capsule 0  . ELIQUIS 5 MG TABS tablet TAKE ONE TABLET TWICE DAILY 60 tablet 11  . fish oil-omega-3 fatty acids 1000 MG capsule Take 1 g by mouth daily.     . furosemide (LASIX) 20 MG tablet Take 20 mg by mouth daily as needed.    . Glucosamine HCl (GLUCOSAMINE PO) Take 800 mg by mouth daily.    . Loratadine (CLARITIN PO) Take 1 tablet by mouth daily as needed.     . Magnesium 250 MG TABS Take 250 mg by mouth daily.    . metoprolol succinate (TOPROL-XL) 50 MG 24 hr tablet TAKE ONE TABLET EACH DAY WITH OR IMMEDIATELY FOLLOWING A MEAL 30 tablet 10  . naproxen sodium (ANAPROX) 220 MG tablet Take 220 mg by mouth 2 (two) times daily with a meal.    . pyridOXINE (VITAMIN B-6) 100 MG tablet Take 100 mg by mouth daily.    Marland Kitchen tiZANidine (ZANAFLEX) 4 MG tablet Take 1 tablet (4 mg total) by mouth every 6 (six) hours as needed for muscle spasms. 30 tablet 0   No current facility-administered medications for this visit.     Physical Exam: BP 127/69 mmHg  Pulse 62  Resp 16  Ht 5\' 7"  (1.702 m)  Wt 149 lb (67.586 kg)  BMI 23.33 kg/m2  SpO2 96% Lung exam is clear. Cardiac exam shows a regular rate and rhythm with normal heart sounds. The left chest incision looks fine.  There is a small mobile mass in the subcutaneous tissue of the left back that looks like a lipoma.   Diagnostic Tests:  CLINICAL DATA: Lung mass  EXAM: CHEST 2 VIEW  COMPARISON: 04/14/2014  FINDINGS: Heart size within normal limits. Negative for heart failure. Lungs remain clear without infiltrate or effusion. No mass or adenopathy. No skeletal abnormality.  IMPRESSION: No active cardiopulmonary disease.   Electronically Signed  By: Franchot Gallo M.D.  On: 04/06/2015 12:16  Impression:  She continues to do well following resection of a solitary fibrous tumor of the pleura in August of 2007.   Plan:   I'll see her back in one year with a repeat chest x-ray.    Gaye Pollack, MD [facility] [phone]

## 2015-04-20 ENCOUNTER — Ambulatory Visit: Payer: Medicare Other | Admitting: Surgery

## 2015-05-30 DIAGNOSIS — Z96641 Presence of right artificial hip joint: Secondary | ICD-10-CM | POA: Diagnosis not present

## 2015-05-30 DIAGNOSIS — Z471 Aftercare following joint replacement surgery: Secondary | ICD-10-CM | POA: Diagnosis not present

## 2015-07-06 DIAGNOSIS — H25012 Cortical age-related cataract, left eye: Secondary | ICD-10-CM | POA: Diagnosis not present

## 2015-07-06 DIAGNOSIS — H40013 Open angle with borderline findings, low risk, bilateral: Secondary | ICD-10-CM | POA: Diagnosis not present

## 2015-07-06 DIAGNOSIS — H25011 Cortical age-related cataract, right eye: Secondary | ICD-10-CM | POA: Diagnosis not present

## 2015-07-06 DIAGNOSIS — H2511 Age-related nuclear cataract, right eye: Secondary | ICD-10-CM | POA: Diagnosis not present

## 2015-07-06 DIAGNOSIS — H524 Presbyopia: Secondary | ICD-10-CM | POA: Diagnosis not present

## 2015-07-06 DIAGNOSIS — H2512 Age-related nuclear cataract, left eye: Secondary | ICD-10-CM | POA: Diagnosis not present

## 2015-07-25 ENCOUNTER — Other Ambulatory Visit: Payer: Self-pay

## 2015-07-25 DIAGNOSIS — Z1231 Encounter for screening mammogram for malignant neoplasm of breast: Secondary | ICD-10-CM

## 2015-08-16 ENCOUNTER — Ambulatory Visit
Admission: RE | Admit: 2015-08-16 | Discharge: 2015-08-16 | Disposition: A | Discharge status: Home / Self Care | Payer: Medicare Other | Source: Ambulatory Visit

## 2015-08-16 DIAGNOSIS — Z1231 Encounter for screening mammogram for malignant neoplasm of breast: Secondary | ICD-10-CM | POA: Diagnosis not present

## 2015-08-25 ENCOUNTER — Other Ambulatory Visit: Payer: Self-pay | Admitting: Internal Medicine

## 2015-09-15 ENCOUNTER — Ambulatory Visit: Payer: Medicare Other

## 2015-10-10 DIAGNOSIS — H1013 Acute atopic conjunctivitis, bilateral: Secondary | ICD-10-CM | POA: Diagnosis not present

## 2015-10-10 DIAGNOSIS — H40013 Open angle with borderline findings, low risk, bilateral: Secondary | ICD-10-CM | POA: Diagnosis not present

## 2015-10-10 DIAGNOSIS — H02833 Dermatochalasis of right eye, unspecified eyelid: Secondary | ICD-10-CM | POA: Diagnosis not present

## 2015-10-10 DIAGNOSIS — H04123 Dry eye syndrome of bilateral lacrimal glands: Secondary | ICD-10-CM | POA: Diagnosis not present

## 2015-10-13 ENCOUNTER — Encounter (HOSPITAL_COMMUNITY): Payer: Self-pay | Admitting: *Deleted

## 2015-10-13 ENCOUNTER — Emergency Department (HOSPITAL_COMMUNITY)
Admission: EM | Admit: 2015-10-13 | Discharge: 2015-10-13 | Disposition: A | Discharge status: Home / Self Care | Payer: Medicare Other | Attending: Emergency Medicine | Admitting: Emergency Medicine

## 2015-10-13 ENCOUNTER — Emergency Department (HOSPITAL_COMMUNITY): Payer: Medicare Other

## 2015-10-13 DIAGNOSIS — Z96641 Presence of right artificial hip joint: Secondary | ICD-10-CM | POA: Diagnosis not present

## 2015-10-13 DIAGNOSIS — Y999 Unspecified external cause status: Secondary | ICD-10-CM | POA: Diagnosis not present

## 2015-10-13 DIAGNOSIS — Z7901 Long term (current) use of anticoagulants: Secondary | ICD-10-CM | POA: Diagnosis not present

## 2015-10-13 DIAGNOSIS — W19XXXA Unspecified fall, initial encounter: Secondary | ICD-10-CM

## 2015-10-13 DIAGNOSIS — S0990XA Unspecified injury of head, initial encounter: Secondary | ICD-10-CM | POA: Diagnosis present

## 2015-10-13 DIAGNOSIS — W0110XA Fall on same level from slipping, tripping and stumbling with subsequent striking against unspecified object, initial encounter: Secondary | ICD-10-CM | POA: Insufficient documentation

## 2015-10-13 DIAGNOSIS — R51 Headache: Secondary | ICD-10-CM | POA: Insufficient documentation

## 2015-10-13 DIAGNOSIS — Z79899 Other long term (current) drug therapy: Secondary | ICD-10-CM | POA: Diagnosis not present

## 2015-10-13 DIAGNOSIS — Y92137 Garden or yard on military base as the place of occurrence of the external cause: Secondary | ICD-10-CM | POA: Diagnosis not present

## 2015-10-13 DIAGNOSIS — Y9389 Activity, other specified: Secondary | ICD-10-CM | POA: Insufficient documentation

## 2015-10-13 DIAGNOSIS — IMO0002 Reserved for concepts with insufficient information to code with codable children: Secondary | ICD-10-CM

## 2015-10-13 DIAGNOSIS — S199XXA Unspecified injury of neck, initial encounter: Secondary | ICD-10-CM | POA: Diagnosis not present

## 2015-10-13 DIAGNOSIS — E785 Hyperlipidemia, unspecified: Secondary | ICD-10-CM | POA: Diagnosis not present

## 2015-10-13 DIAGNOSIS — S0101XA Laceration without foreign body of scalp, initial encounter: Secondary | ICD-10-CM | POA: Insufficient documentation

## 2015-10-13 DIAGNOSIS — S0003XA Contusion of scalp, initial encounter: Secondary | ICD-10-CM | POA: Diagnosis not present

## 2015-10-13 MED ORDER — LIDOCAINE HCL (PF) 1 % IJ SOLN
5.0000 mL | Freq: Once | INTRAMUSCULAR | Status: AC
  Administered 2015-10-13: 5 mL
  Filled 2015-10-13: qty 5

## 2015-10-13 MED ORDER — LIDOCAINE-EPINEPHRINE-TETRACAINE (LET) SOLUTION
3.0000 mL | Freq: Once | NASAL | Status: AC
Start: 2015-10-13 — End: 2015-10-13
  Administered 2015-10-13: 3 mL via TOPICAL
  Filled 2015-10-13: qty 3

## 2015-10-13 NOTE — ED Notes (Signed)
Patient able to ambulate independently  

## 2015-10-13 NOTE — ED Provider Notes (Signed)
Medical screening examination/treatment/procedure(s) were conducted as a shared visit with non-physician practitioner(s) and myself.  I personally evaluated the patient during the encounter.   EKG Interpretation None      80 year old female with history of atrial fibrillation on Xarelto who presents after mechanical fall. She was working with her garden, and misstepped on the flower bed. She fell backwards hitting her head on concrete. She did not have loss of consciousness but states seeing "stars". No vision or speech changes, numbness or weakness, difficulty walking. Denies any other injuries including neck pain, back pain, chest pain or abdominal pain. On presentation she does have large occipital scalp hematoma with associating scalp laceration. No other injuries identified on exam. She is neurologically intact otherwise. CT head and cervical spine are performed. No evidence of intracranial bleeding or cervical spine injury. Scalp laceration is repaired. Strict return and follow-up instructions are reviewed. She expressed understanding of all discharge instructions and felt comfortable to plan of care.  Forde Dandy, MD 10/13/15 [phone]

## 2015-10-13 NOTE — ED Notes (Signed)
Pt working in garden when she slipped and fell backwards.  Pt has lac to posterior head and complains of tenderness in her neck when turning her head side to side.

## 2015-10-13 NOTE — Discharge Instructions (Signed)
Read the information below.  You received one staple and two stitches in the laceration in your head. Keep wound area clean and dry for first 24hrs. Your CT was negative for an intracranial bleed or skull fracture. Keep wound area clean and dry. Apply antibiotic ointment to wound daily. You can apply ice to the area for 20 minute increments as needed.  At this time you declined imaging of your left elbow. You can ice your elbow as needed. If the pain persists I encourage you to follow up with your PCP regarding imaging.  You can use tylenol or ibuprofen for pain relief.  Use the prescribed medication as directed.  Please discuss all new medications with your pharmacist.   Be sure to follow up with your PCP within the next week. The staple and sutures will need to be removed in 7-14 days.  You may return to the Emergency Department at any time for worsening condition or any new symptoms that concern you. Return to ED if you develop persistent headaches, have new neurologic symptoms, develop fever, chills, night sweats, swelling, redness or purulent drainage. If you develop lightheadedness, dizziness be sure to follow up in the ED.

## 2015-10-13 NOTE — ED Provider Notes (Signed)
CSN: [id number]     Arrival date & time 10/13/15  1018 History   First MD Initiated Contact with Patient 10/13/15 1020     Chief Complaint  Patient presents with  . Fall     (Consider location/radiation/quality/duration/timing/severity/associated sxs/prior Treatment) HPI Comments: [name] is a 80 y.o. female with history of afib on Eliquis and HLD presents to ED following a fall. Patient reports her foot slipped in the flower bed and she fell hitting the back of head on a concrete rock. Patient denies LOC, but did see "stars." An ice pack was applied and she promptly came to the ED. She is currently on a blood thinner - Eliquis. Bleeding is controlled. Pain at site in which she hit is 4-5/10. She has associated neck pain. Denies fever, chills, nightsweats. No numbness, weakness, changes in vision, confusion, or trouble swallowing. No CP or SOB. No hematuria. No N/V.   Patient is a 80 y.o. female presenting with fall. The history is provided by the patient and medical records.  Fall Associated symptoms include headaches (left occipital at site in which she hit her head) and neck pain.    Past Medical History  Diagnosis Date  . Hyperlipidemia   . Fracture     at l1  . Fracture     right arm x 2  . Paroxysmal atrial fibrillation (HCC)   . Benign tumor of pleura 2007  . Seasonal allergies   . Arthritis   . Bursitis   . Cataracts, bilateral   . Sarcoidosis ([place])     As a child  . History of blood transfusion age 3 or 89  . Complication of anesthesia   . PONV (postoperative nausea and vomiting) 48 yrs ago    with ether  . Cough since 05-09-2014    with sore throat   Past Surgical History  Procedure Laterality Date  . Left video-assisted thoracoscopy, left thoracotomy with wedge resection of left lower lobe lung mass.  insertion of on-q pain pump  12/03/2005    [facility]  . Appendectomy    . Vaginal hysterectomy    . Abdominal hysterectomy      complete  . Tonsillectomy  as child   . Fracture surgery Right 2007  . Rotator cuff repair Left 2008  . Total hip arthroplasty Right 05/18/2014    Procedure: RIGHT TOTAL HIP ARTHROPLASTY ANTERIOR APPROACH;  Surgeon: [name], MD;  Location: [facility];  Service: Orthopedics;  Laterality: Right;   Family History  Problem Relation Age of Onset  . Heart disease Father   . Heart disease Brother   . Cancer Brother   . Cancer Maternal Grandfather    Social History  Substance Use Topics  . Smoking status: Never Smoker   . Smokeless tobacco: Never Used  . Alcohol Use: Yes     Comment: occasional   OB History    No data available     Review of Systems  Musculoskeletal: Positive for neck pain.  Neurological: Positive for headaches (left occipital at site in which she hit her head).  All other systems reviewed and are negative.     Allergies  Review of patient's allergies indicates no known allergies.  Home Medications   Prior to Admission medications   Medication Sig Start Date End Date Taking? Authorizing Provider  apixaban (ELIQUIS) 5 MG TABS tablet Take 5 mg by mouth 2 (two) times daily.   Yes Historical Provider, MD  Calcium Carbonate-Vitamin D (CALCIUM + D  PO) Take 1 tablet by mouth daily.   Yes Historical Provider, MD  docusate sodium 100 MG CAPS Take 100 mg by mouth 2 (two) times daily. 05/19/14  Yes Danae Orleans, PA-C  ELIQUIS 5 MG TABS tablet TAKE ONE TABLET TWICE DAILY 08/26/15  Yes Deboraha Sprang, MD  fish oil-omega-3 fatty acids 1000 MG capsule Take 1 g by mouth daily.    Yes Historical Provider, MD  furosemide (LASIX) 20 MG tablet Take 20 mg by mouth daily as needed.   Yes Historical Provider, MD  Glucosamine HCl (GLUCOSAMINE PO) Take 800 mg by mouth daily.   Yes Historical Provider, MD  Loratadine (CLARITIN PO) Take 1 tablet by mouth daily as needed.    Yes Historical Provider, MD  Magnesium 250 MG TABS Take 250 mg by mouth daily.   Yes Historical Provider, MD  metoprolol succinate (TOPROL-XL) 50 MG  24 hr tablet TAKE ONE TABLET EACH DAY WITH OR IMMEDIATELY FOLLOWING A MEAL 08/26/15  Yes Deboraha Sprang, MD  naproxen sodium (ANAPROX) 220 MG tablet Take 220 mg by mouth 2 (two) times daily with a meal.   Yes Historical Provider, MD  pyridOXINE (VITAMIN B-6) 100 MG tablet Take 100 mg by mouth daily.   Yes Historical Provider, MD  tiZANidine (ZANAFLEX) 4 MG tablet Take 1 tablet (4 mg total) by mouth every 6 (six) hours as needed for muscle spasms. 05/19/14  Yes Matthew Babish, PA-C   BP 122/50 mmHg  Pulse 62  Temp(Src) 97.5 F (36.4 C) (Oral)  Resp 17  Ht 5\' 6"  (1.676 m)  Wt 62.596 kg  BMI 22.28 kg/m2  SpO2 99% Physical Exam  Constitutional: She appears well-developed and well-nourished. No distress.  HENT:  Head: Normocephalic and atraumatic. Head is without raccoon's eyes and without Battle's sign.  Mouth/Throat: Oropharynx is clear and moist. No oropharyngeal exudate.  Eyes: Conjunctivae and EOM are normal. Pupils are equal, round, and reactive to light. Right eye exhibits no discharge. Left eye exhibits no discharge. No scleral icterus.  Neck: Normal range of motion. Neck supple.  Cardiovascular: Normal rate, regular rhythm, normal heart sounds and intact distal pulses.   No murmur heard. Pulmonary/Chest: Effort normal and breath sounds normal. No respiratory distress.  Musculoskeletal: Normal range of motion.  Mild TTP of cervical spine. No step off. Neck ROM intact.   Lymphadenopathy:    She has no cervical adenopathy.  Neurological: She is alert. She displays a negative Romberg sign. Coordination normal.  Mental Status:  Alert, thought content appropriate, able to give a coherent history. Speech fluent without evidence of aphasia. Able to follow 2 step commands without difficulty.  Cranial Nerves:  II:  Peripheral visual fields grossly normal, pupils equal, round, reactive to light III,IV, VI: ptosis not present, extra-ocular motions intact bilaterally  V,VII: smile symmetric,  facial light touch sensation equal VIII: hearing grossly normal to voice  X: uvula elevates symmetrically  XI: bilateral shoulder shrug symmetric and strong XII: midline tongue extension without fassiculations Motor:  Normal tone. 5/5 in upper and lower extremities bilaterally including strong and equal grip strength and dorsiflexion/plantar flexion Sensory: grossly intact to light touch Cerebellar: normal finger-to-nose with bilateral upper extremities Gait: normal gait and balance CV: distal pulses palpable throughout  Skin: Skin is warm and dry. She is not diaphoretic.     Psychiatric: She has a normal mood and affect. Her behavior is normal.    ED Course  .Marland KitchenLaceration Repair Date/Time: 10/13/2015 5:49 PM Performed by: Gay Filler  LAUREL Authorized by: Roxanna Mew Consent: Verbal consent obtained. Risks and benefits: risks, benefits and alternatives were discussed Consent given by: patient Patient understanding: patient states understanding of the procedure being performed Patient consent: the patient's understanding of the procedure matches consent given Procedure consent: procedure consent matches procedure scheduled Relevant documents: relevant documents present and verified Test results: test results available and properly labeled Site marked: the operative site was marked Imaging studies: imaging studies available Required items: required blood products, implants, devices, and special equipment available Patient identity confirmed: verbally with patient Time out: Immediately prior to procedure a "time out" was called to verify the correct patient, procedure, equipment, support staff and site/side marked as required. Body area: head/neck Location details: scalp Laceration length: 3 cm Foreign bodies: no foreign bodies Tendon involvement: none Nerve involvement: none Vascular damage: no Anesthesia: local infiltration Local anesthetic: lidocaine 1% without  epinephrine and LET (lido,epi,tetracaine) Anesthetic total: 3 ml Patient sedated: no Preparation: Patient was prepped and draped in the usual sterile fashion. Irrigation solution: saline Irrigation method: syringe Amount of cleaning: standard Debridement: none Degree of undermining: none Skin closure: 4-0 Prolene and staples (1 staple, 2 sutures) Number of sutures: 2 Technique: simple Approximation: close Approximation difficulty: simple Dressing: antibiotic ointment Patient tolerance: Patient tolerated the procedure well with no immediate complications   (including critical care time) Labs Review Labs Reviewed - No data to display  Imaging Review Ct Head Wo Contrast  10/13/2015  CLINICAL DATA:  Fall, on blood thinners. EXAM: CT HEAD WITHOUT CONTRAST CT CERVICAL SPINE WITHOUT CONTRAST TECHNIQUE: Multidetector CT imaging of the head and cervical spine was performed following the standard protocol without intravenous contrast. Multiplanar CT image reconstructions of the cervical spine were also generated. COMPARISON:  None. FINDINGS: CT HEAD FINDINGS Ventricles are normal in size and configuration. There is no mass, hemorrhage, edema or other evidence of acute parenchymal abnormality. No extra-axial hemorrhage. Soft tissue edema/hematoma overlying the left occipital bone. Hematoma component measures approximately 2.1 x 0.9 cm. No underlying fracture. CT CERVICAL SPINE FINDINGS Mild degenerative change within the mid and lower cervical spine, with associated disc space narrowings and mild osseous spurring. No more than mild central canal stenosis at any level. Slight anterolisthesis of C4 is likely related to the underlying degenerative change. Alignment is otherwise normal. No fracture line or displaced fracture fragment identified. Facet joints are normally aligned throughout. Paravertebral soft tissues are unremarkable. IMPRESSION: 1. Soft tissue edema/hematoma overlying the left occipital  bone. Hematoma component measures approximately 2.1 x 0.9 cm. No underlying fracture. 2. No acute intracranial findings. No intracranial hemorrhage or edema. 3. No fracture or acute subluxation within the cervical spine. Mild degenerative change, as described above. Electronically Signed   By: Franki Cabot M.D.   On: 10/13/2015 11:57   Ct Cervical Spine Wo Contrast  10/13/2015  CLINICAL DATA:  Fall, on blood thinners. EXAM: CT HEAD WITHOUT CONTRAST CT CERVICAL SPINE WITHOUT CONTRAST TECHNIQUE: Multidetector CT imaging of the head and cervical spine was performed following the standard protocol without intravenous contrast. Multiplanar CT image reconstructions of the cervical spine were also generated. COMPARISON:  None. FINDINGS: CT HEAD FINDINGS Ventricles are normal in size and configuration. There is no mass, hemorrhage, edema or other evidence of acute parenchymal abnormality. No extra-axial hemorrhage. Soft tissue edema/hematoma overlying the left occipital bone. Hematoma component measures approximately 2.1 x 0.9 cm. No underlying fracture. CT CERVICAL SPINE FINDINGS Mild degenerative change within the mid and lower cervical spine, with associated disc  space narrowings and mild osseous spurring. No more than mild central canal stenosis at any level. Slight anterolisthesis of C4 is likely related to the underlying degenerative change. Alignment is otherwise normal. No fracture line or displaced fracture fragment identified. Facet joints are normally aligned throughout. Paravertebral soft tissues are unremarkable. IMPRESSION: 1. Soft tissue edema/hematoma overlying the left occipital bone. Hematoma component measures approximately 2.1 x 0.9 cm. No underlying fracture. 2. No acute intracranial findings. No intracranial hemorrhage or edema. 3. No fracture or acute subluxation within the cervical spine. Mild degenerative change, as described above. Electronically Signed   By: Franki Cabot M.D.   On: 10/13/2015  11:57   I have personally reviewed and evaluated these images and lab results as part of my medical decision-making.   EKG Interpretation None      MDM   Final diagnoses:  Fall, initial encounter  Laceration   Patient is afebrile and non-toxic appearing. Her vital signs are stable. Physical exam remarkable for 3cm hematoma on left occipital region of head. Mild TTP of cervical spine, ROM intact. Neurologic exam re-assuring. Patient on blood thinner. CT head and neck to r/o intracranial bleed or fracture. Pending results will clean/irrigate wound and assess for repair.   CT head negative for intracranial hemorrhage. CT neck negative for fracture or subluxation. Laceration repair was completed with placement of 1 staple and 2 sutures. Patient tolerated procedure well. Wound care instructions were given. During laceration repair, patient complained of left elbow pain. On exam, some superficial abrasions were noted. No ecchymosis. No warmth, swelling, or erythema. No TTP of bony landmarks. ROM intact. Strength and sensation intact. Offered x-ray of left elbow to r/o fracture; however, patient politely declined imaging at this time.   Discussed results with patient. Tylenol for pain relief. Encourage follow up with PCP within the next week for re-evaluation. Return precautions provided. Patient voiced understanding and is agreeable.   Roxanna Mew, PA-C 10/13/15 [place] Liu, MD 10/14/15 [phone]

## 2015-10-19 ENCOUNTER — Encounter: Payer: Self-pay | Admitting: Internal Medicine

## 2015-10-19 ENCOUNTER — Ambulatory Visit (INDEPENDENT_AMBULATORY_CARE_PROVIDER_SITE_OTHER): Payer: Medicare Other | Admitting: Internal Medicine

## 2015-10-19 VITALS — BP 98/58 | HR 67 | Ht 67.0 in | Wt 141.6 lb

## 2015-10-19 DIAGNOSIS — I5030 Unspecified diastolic (congestive) heart failure: Secondary | ICD-10-CM | POA: Diagnosis not present

## 2015-10-19 DIAGNOSIS — I341 Nonrheumatic mitral (valve) prolapse: Secondary | ICD-10-CM

## 2015-10-19 DIAGNOSIS — I480 Paroxysmal atrial fibrillation: Secondary | ICD-10-CM | POA: Diagnosis not present

## 2015-10-19 NOTE — Patient Instructions (Signed)

## 2015-10-19 NOTE — Progress Notes (Signed)
Patient Care Team: Shon Baton, MD as PCP - General (Internal Medicine) Gaye Pollack, MD (Cardiothoracic Surgery) Shon Baton, MD as Consulting Physician (Internal Medicine)   HPI  [name] is a 80 y.o. female Seen in followup for exercise associated dizziness and palpitations with history of chest pain Stress echo demonstrated no echocardiographic changes; images were poor. Left ventricular function was normal  CBC and TSH were normal   Event recorder>>atrial fibrillation. Atenolol resulted in palpitations being much improved     Echo stress test 4/13 demonstrated normal left ventricular function with mild MR and moderate MVP    Echocardiogram 4/15 demonstrated normal LV function no significant atrial enlargement (30/1.66) and normal wall thickness  She recently fell. She tripped. Head CT was negative. No lightheadedness.  Past Medical History  Diagnosis Date  . Hyperlipidemia   . Fracture     at l1  . Fracture     right arm x 2  . Paroxysmal atrial fibrillation (HCC)   . Benign tumor of pleura 2007  . Seasonal allergies   . Arthritis   . Bursitis   . Cataracts, bilateral   . Sarcoidosis ([place])     As a child  . History of blood transfusion age 18 or 80  . Complication of anesthesia   . PONV (postoperative nausea and vomiting) 48 yrs ago    with ether  . Cough since 05-09-2014    with sore throat    Past Surgical History  Procedure Laterality Date  . Left video-assisted thoracoscopy, left thoracotomy with wedge resection of left lower lobe lung mass.  insertion of on-q pain pump  12/03/2005    [facility]  . Appendectomy    . Vaginal hysterectomy    . Abdominal hysterectomy      complete  . Tonsillectomy  as child  . Fracture surgery Right 2007  . Rotator cuff repair Left 2008  . Total hip arthroplasty Right 05/18/2014    Procedure: RIGHT TOTAL HIP ARTHROPLASTY ANTERIOR APPROACH;  Surgeon: [name], MD;  Location: [facility];  Service: Orthopedics;   Laterality: Right;    Current Outpatient Prescriptions  Medication Sig Dispense Refill  . apixaban (ELIQUIS) 5 MG TABS tablet Take 5 mg by mouth 2 (two) times daily.    . Calcium Carbonate-Vitamin D (CALCIUM + D PO) Take 1 tablet by mouth daily.    Marland Kitchen docusate sodium 100 MG CAPS Take 100 mg by mouth 2 (two) times daily. 10 capsule 0  . ELIQUIS 5 MG TABS tablet TAKE ONE TABLET TWICE DAILY 60 tablet 1  . fish oil-omega-3 fatty acids 1000 MG capsule Take 1 g by mouth daily.     . furosemide (LASIX) 20 MG tablet Take 20 mg by mouth daily as needed.    . Glucosamine HCl (GLUCOSAMINE PO) Take 800 mg by mouth daily.    Marland Kitchen loratadine (CLARITIN) 10 MG tablet Take 10 mg by mouth daily as needed for allergies.    . Magnesium 250 MG TABS Take 250 mg by mouth daily.    . metoprolol succinate (TOPROL-XL) 50 MG 24 hr tablet Take 50 mg by mouth daily. Take with or immediately following a meal.    . naproxen sodium (ANAPROX) 220 MG tablet Take 220 mg by mouth 2 (two) times daily with a meal.    . pyridOXINE (VITAMIN B-6) 100 MG tablet Take 100 mg by mouth daily.    Marland Kitchen tiZANidine (ZANAFLEX) 4 MG tablet Take 1  tablet (4 mg total) by mouth every 6 (six) hours as needed for muscle spasms. 30 tablet 0   No current facility-administered medications for this visit.    No Known Allergies  Review of Systems negative except from HPI and PMH  Physical Exam BP 98/58 mmHg  Pulse 67  Ht 5\' 7"  (1.702 m)  Wt 141 lb 9.6 oz (64.229 kg)  BMI 22.17 kg/m2  SpO2 93% Well developed and well nourished in no acute distress HENT normal E scleral and icterus clear Neck Supple JVP 7-8; carotids brisk and full Clear to ausculation Regular rate and rhythm, no murmurs gallops or rub Soft with active bowel sounds No clubbing cyanosis  No  Edema Alert and oriented, grossly normal motor and sensory function Skin Warm and Dry   ECG today demonstrated sinus rhythm at 67 Intervals 20/09*/42 Axis -61 Inferolateral T wave  inversions   Assessment and  Plan  Mitral valve prolapse  Paroxysmal atrial fibrillation    HFpEF     She is euvolemic  She has used her diuretics when necessary.   We will continue her on apixaban  Will follow for headaches after fall

## 2015-10-21 DIAGNOSIS — W01198A Fall on same level from slipping, tripping and stumbling with subsequent striking against other object, initial encounter: Secondary | ICD-10-CM | POA: Diagnosis not present

## 2015-10-21 DIAGNOSIS — S0101XA Laceration without foreign body of scalp, initial encounter: Secondary | ICD-10-CM | POA: Diagnosis not present

## 2015-10-21 DIAGNOSIS — Z4802 Encounter for removal of sutures: Secondary | ICD-10-CM | POA: Diagnosis not present

## 2015-10-21 DIAGNOSIS — I480 Paroxysmal atrial fibrillation: Secondary | ICD-10-CM | POA: Diagnosis not present

## 2015-11-04 ENCOUNTER — Telehealth: Payer: Self-pay | Admitting: Internal Medicine

## 2015-11-04 DIAGNOSIS — W19XXXA Unspecified fall, initial encounter: Secondary | ICD-10-CM

## 2015-11-04 DIAGNOSIS — R519 Headache, unspecified: Secondary | ICD-10-CM

## 2015-11-04 DIAGNOSIS — R51 Headache: Secondary | ICD-10-CM

## 2015-11-04 NOTE — Telephone Encounter (Signed)
Pt calling to request an order for a CT scan of the head, as previously discussed with Dr Caryl Comes.  Pt states she fell about 3 weeks ago and hit her head.  Pt states that she is on Eliquis.  Pt states that she discussed this with Dr Caryl Comes, and he advised her to call back if she has severe headaches from her accident.  Pt states she is "not" having severe headaches at all, but she would like to go forth and get an order for a head CT, to be on the safer side.  Pt states that she is very worried that this could be potentially harmful, for her Husband fell and hit his head a couple of years ago, and he too was on an anticoag and his bleed showed up 3 weeks later, as a subdural hematoma.  Pt states that she wants to be preventative with this issue, for she will be going to [place] next Friday 7/14, and wants to be at optimal health.  Informed the pt that Dr Caryl Comes and his nurse are both out of the office today, but I will route this message to them for further review and recommendation of order requested.  Informed the pt that someone from the office will follow-up with her once recommendations are provided.  Pt verbalized understanding and agrees with plan.

## 2015-11-04 NOTE — Telephone Encounter (Signed)
New Message   Pt call requesting to speak with RN. Pt stated she had a fall about three weeks ago states she spoke with Dr. Caryl Comes about a CT Scan. Pt wants to know if the doctor could put in an order for a scan to be preformed. Please call back to discuss

## 2015-11-05 ENCOUNTER — Other Ambulatory Visit: Payer: Self-pay | Admitting: Internal Medicine

## 2015-11-07 NOTE — Telephone Encounter (Signed)
plx arrange head CT without contrast   R/o bleed with fall on anticoagulation

## 2015-11-08 NOTE — Telephone Encounter (Signed)
Order placed for Head CT. To scheduling to call patient and arrange.

## 2015-11-09 ENCOUNTER — Ambulatory Visit (INDEPENDENT_AMBULATORY_CARE_PROVIDER_SITE_OTHER)
Admission: RE | Admit: 2015-11-09 | Discharge: 2015-11-09 | Disposition: A | Discharge status: Home / Self Care | Payer: Medicare Other | Source: Ambulatory Visit | Attending: Internal Medicine | Admitting: Internal Medicine

## 2015-11-09 DIAGNOSIS — R519 Headache, unspecified: Secondary | ICD-10-CM

## 2015-11-09 DIAGNOSIS — R51 Headache: Secondary | ICD-10-CM | POA: Diagnosis not present

## 2016-01-16 DIAGNOSIS — H40013 Open angle with borderline findings, low risk, bilateral: Secondary | ICD-10-CM | POA: Diagnosis not present

## 2016-01-16 DIAGNOSIS — H2512 Age-related nuclear cataract, left eye: Secondary | ICD-10-CM | POA: Diagnosis not present

## 2016-01-16 DIAGNOSIS — H2511 Age-related nuclear cataract, right eye: Secondary | ICD-10-CM | POA: Diagnosis not present

## 2016-01-16 DIAGNOSIS — H25012 Cortical age-related cataract, left eye: Secondary | ICD-10-CM | POA: Diagnosis not present

## 2016-01-16 DIAGNOSIS — H25011 Cortical age-related cataract, right eye: Secondary | ICD-10-CM | POA: Diagnosis not present

## 2016-02-03 DIAGNOSIS — E784 Other hyperlipidemia: Secondary | ICD-10-CM | POA: Diagnosis not present

## 2016-02-03 DIAGNOSIS — E559 Vitamin D deficiency, unspecified: Secondary | ICD-10-CM | POA: Diagnosis not present

## 2016-02-06 DIAGNOSIS — I471 Supraventricular tachycardia: Secondary | ICD-10-CM | POA: Diagnosis not present

## 2016-02-06 DIAGNOSIS — J3089 Other allergic rhinitis: Secondary | ICD-10-CM | POA: Diagnosis not present

## 2016-02-06 DIAGNOSIS — Z23 Encounter for immunization: Secondary | ICD-10-CM | POA: Diagnosis not present

## 2016-02-06 DIAGNOSIS — D8689 Sarcoidosis of other sites: Secondary | ICD-10-CM | POA: Diagnosis not present

## 2016-02-06 DIAGNOSIS — Z1389 Encounter for screening for other disorder: Secondary | ICD-10-CM | POA: Diagnosis not present

## 2016-02-06 DIAGNOSIS — I341 Nonrheumatic mitral (valve) prolapse: Secondary | ICD-10-CM | POA: Diagnosis not present

## 2016-02-06 DIAGNOSIS — M859 Disorder of bone density and structure, unspecified: Secondary | ICD-10-CM | POA: Diagnosis not present

## 2016-02-06 DIAGNOSIS — Z0000 Encounter for general adult medical examination without abnormal findings: Secondary | ICD-10-CM | POA: Diagnosis not present

## 2016-02-06 DIAGNOSIS — Z6822 Body mass index (BMI) 22.0-22.9, adult: Secondary | ICD-10-CM | POA: Diagnosis not present

## 2016-02-06 DIAGNOSIS — E784 Other hyperlipidemia: Secondary | ICD-10-CM | POA: Diagnosis not present

## 2016-02-06 DIAGNOSIS — I480 Paroxysmal atrial fibrillation: Secondary | ICD-10-CM | POA: Diagnosis not present

## 2016-02-06 DIAGNOSIS — M1990 Unspecified osteoarthritis, unspecified site: Secondary | ICD-10-CM | POA: Diagnosis not present

## 2016-02-09 DIAGNOSIS — Z1212 Encounter for screening for malignant neoplasm of rectum: Secondary | ICD-10-CM | POA: Diagnosis not present

## 2016-03-27 DIAGNOSIS — H2511 Age-related nuclear cataract, right eye: Secondary | ICD-10-CM | POA: Diagnosis not present

## 2016-03-27 DIAGNOSIS — H25811 Combined forms of age-related cataract, right eye: Secondary | ICD-10-CM | POA: Diagnosis not present

## 2016-03-27 DIAGNOSIS — H25011 Cortical age-related cataract, right eye: Secondary | ICD-10-CM | POA: Diagnosis not present

## 2016-04-03 ENCOUNTER — Other Ambulatory Visit: Payer: Self-pay | Admitting: Surgery

## 2016-04-03 DIAGNOSIS — D190 Benign neoplasm of mesothelial tissue of pleura: Secondary | ICD-10-CM

## 2016-04-04 ENCOUNTER — Ambulatory Visit (INDEPENDENT_AMBULATORY_CARE_PROVIDER_SITE_OTHER): Payer: Medicare Other | Admitting: Surgery

## 2016-04-04 ENCOUNTER — Ambulatory Visit
Admission: RE | Admit: 2016-04-04 | Discharge: 2016-04-04 | Disposition: A | Discharge status: Home / Self Care | Payer: Medicare Other | Source: Ambulatory Visit | Attending: Surgery | Admitting: Surgery

## 2016-04-04 ENCOUNTER — Encounter: Payer: Self-pay | Admitting: Surgery

## 2016-04-04 VITALS — BP 119/45 | Resp 16 | Ht 67.0 in | Wt 142.0 lb

## 2016-04-04 DIAGNOSIS — Z09 Encounter for follow-up examination after completed treatment for conditions other than malignant neoplasm: Secondary | ICD-10-CM | POA: Diagnosis not present

## 2016-04-04 DIAGNOSIS — Z8709 Personal history of other diseases of the respiratory system: Secondary | ICD-10-CM

## 2016-04-04 DIAGNOSIS — D190 Benign neoplasm of mesothelial tissue of pleura: Secondary | ICD-10-CM

## 2016-04-04 DIAGNOSIS — Z86018 Personal history of other benign neoplasm: Secondary | ICD-10-CM

## 2016-04-08 ENCOUNTER — Encounter: Payer: Self-pay | Admitting: Surgery

## 2016-04-08 NOTE — Progress Notes (Signed)
     HPI:  The patient returns to my office today for followup status post left thoracotomy and wedge resection of a left lower lobe lung mass on 12/03/2005. 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 Since I saw her about 1 year ago she has continued to do well. She denies any chest pain. She's had no shortness of breath. She denies any cough or hemoptysis.  Current Outpatient Prescriptions  Medication Sig Dispense Refill  . Calcium Carbonate-Vitamin D (CALCIUM + D PO) Take 1 tablet by mouth daily.    Marland Kitchen docusate sodium 100 MG CAPS Take 100 mg by mouth 2 (two) times daily. 10 capsule 0  . ELIQUIS 5 MG TABS tablet TAKE ONE TABLET TWICE DAILY 60 tablet 11  . fish oil-omega-3 fatty acids 1000 MG capsule Take 1 g by mouth daily.     . furosemide (LASIX) 20 MG tablet Take 20 mg by mouth daily as needed.    . loratadine (CLARITIN) 10 MG tablet Take 10 mg by mouth daily as needed for allergies.    . Magnesium 250 MG TABS Take 250 mg by mouth daily.    . metoprolol succinate (TOPROL-XL) 50 MG 24 hr tablet TAKE ONE TABLET EACH DAY WITH OR IMMEDIATELY FOLLOWING A MEAL 30 tablet 11  . naproxen sodium (ANAPROX) 220 MG tablet Take 220 mg by mouth 2 (two) times daily with a meal.    . tiZANidine (ZANAFLEX) 4 MG tablet Take 1 tablet (4 mg total) by mouth every 6 (six) hours as needed for muscle spasms. 30 tablet 0   No current facility-administered medications for this visit.      Physical Exam: BP (!) 119/45 (BP Location: Right Arm, Patient Position: Sitting, Cuff Size: Normal)   Resp 16   Ht 5\' 7"  (1.702 m)   Wt 142 lb (64.4 kg)   SpO2 94% Comment: ON RA  BMI 22.24 kg/m  Lung exam is clear. Cardiac exam shows a regular rate and rhythm with normal heart sounds. The left chest incision looks fine. There is a small mobile mass in the subcutaneous tissue of the left back that looks like a lipoma and is unchanged from prior exam.  Diagnostic Tests:  CLINICAL DATA:  Previous left  chest surgery for pleural fibrous tumor. Follow-up.  EXAM: CHEST  2 VIEW  COMPARISON:  04/06/2015 and multiple previous  FINDINGS: Heart size is normal. The aorta shows tortuosity. The lungs are clear. Narrow AP diameter of the chest. No pleural effusion or abnormal pleural density. No abnormal bone finding.  IMPRESSION: No active cardiopulmonary disease.   Electronically Signed   By: Nelson Chimes M.D.   On: 04/04/2016 09:29   Impression:  She continues to do well following resection of a solitary fibrous tumor of the pleura in August of 2007.   Plan:   I'll see her back in one year with a repeat chest x-ray.   Gaye Pollack, MD [facility] [phone]

## 2016-05-02 DIAGNOSIS — H2512 Age-related nuclear cataract, left eye: Secondary | ICD-10-CM | POA: Diagnosis not present

## 2016-05-02 DIAGNOSIS — H25012 Cortical age-related cataract, left eye: Secondary | ICD-10-CM | POA: Diagnosis not present

## 2016-05-08 DIAGNOSIS — H25012 Cortical age-related cataract, left eye: Secondary | ICD-10-CM | POA: Diagnosis not present

## 2016-05-08 DIAGNOSIS — H25812 Combined forms of age-related cataract, left eye: Secondary | ICD-10-CM | POA: Diagnosis not present

## 2016-05-08 DIAGNOSIS — H2512 Age-related nuclear cataract, left eye: Secondary | ICD-10-CM | POA: Diagnosis not present

## 2016-05-10 DIAGNOSIS — M859 Disorder of bone density and structure, unspecified: Secondary | ICD-10-CM | POA: Diagnosis not present

## 2016-06-05 DIAGNOSIS — L738 Other specified follicular disorders: Secondary | ICD-10-CM | POA: Diagnosis not present

## 2016-06-05 DIAGNOSIS — Z85828 Personal history of other malignant neoplasm of skin: Secondary | ICD-10-CM | POA: Diagnosis not present

## 2016-06-05 DIAGNOSIS — D1801 Hemangioma of skin and subcutaneous tissue: Secondary | ICD-10-CM | POA: Diagnosis not present

## 2016-06-05 DIAGNOSIS — D2262 Melanocytic nevi of left upper limb, including shoulder: Secondary | ICD-10-CM | POA: Diagnosis not present

## 2016-06-05 DIAGNOSIS — D485 Neoplasm of uncertain behavior of skin: Secondary | ICD-10-CM | POA: Diagnosis not present

## 2016-06-05 DIAGNOSIS — L821 Other seborrheic keratosis: Secondary | ICD-10-CM | POA: Diagnosis not present

## 2016-06-05 DIAGNOSIS — L820 Inflamed seborrheic keratosis: Secondary | ICD-10-CM | POA: Diagnosis not present

## 2016-06-05 DIAGNOSIS — L853 Xerosis cutis: Secondary | ICD-10-CM | POA: Diagnosis not present

## 2016-06-13 DIAGNOSIS — Z85828 Personal history of other malignant neoplasm of skin: Secondary | ICD-10-CM | POA: Diagnosis not present

## 2016-06-13 DIAGNOSIS — L988 Other specified disorders of the skin and subcutaneous tissue: Secondary | ICD-10-CM | POA: Diagnosis not present

## 2016-06-13 DIAGNOSIS — D485 Neoplasm of uncertain behavior of skin: Secondary | ICD-10-CM | POA: Diagnosis not present

## 2016-07-13 ENCOUNTER — Other Ambulatory Visit: Payer: Self-pay | Admitting: Internal Medicine

## 2016-07-13 DIAGNOSIS — Z1231 Encounter for screening mammogram for malignant neoplasm of breast: Secondary | ICD-10-CM

## 2016-09-04 ENCOUNTER — Ambulatory Visit
Admission: RE | Admit: 2016-09-04 | Discharge: 2016-09-04 | Disposition: A | Discharge status: Home / Self Care | Payer: Medicare Other | Source: Ambulatory Visit | Attending: Internal Medicine | Admitting: Internal Medicine

## 2016-09-04 DIAGNOSIS — Z1231 Encounter for screening mammogram for malignant neoplasm of breast: Secondary | ICD-10-CM | POA: Diagnosis not present

## 2016-10-11 DIAGNOSIS — H1013 Acute atopic conjunctivitis, bilateral: Secondary | ICD-10-CM | POA: Diagnosis not present

## 2016-10-11 DIAGNOSIS — H04213 Epiphora due to excess lacrimation, bilateral lacrimal glands: Secondary | ICD-10-CM | POA: Diagnosis not present

## 2016-10-11 DIAGNOSIS — H40013 Open angle with borderline findings, low risk, bilateral: Secondary | ICD-10-CM | POA: Diagnosis not present

## 2016-10-11 DIAGNOSIS — H04123 Dry eye syndrome of bilateral lacrimal glands: Secondary | ICD-10-CM | POA: Diagnosis not present

## 2016-10-18 ENCOUNTER — Other Ambulatory Visit: Payer: Self-pay | Admitting: Internal Medicine

## 2016-10-25 ENCOUNTER — Other Ambulatory Visit: Payer: Self-pay | Admitting: Internal Medicine

## 2016-10-25 NOTE — Telephone Encounter (Signed)
Pt last saw Dr Caryl Comes 10/19/15, has upcoming appt tomorrow 10/26/16.  Last labs 05/19/14 Creat 0.67, overdue for labwork, put a note on appt tomorrow with Dr Caryl Comes to draw CBC and BMP, also staff msgd Kern Alberta, RN to make her aware pt needs CBC and BMP while in office at [place].  Will await lab results to refill rx.

## 2016-10-26 ENCOUNTER — Ambulatory Visit (INDEPENDENT_AMBULATORY_CARE_PROVIDER_SITE_OTHER): Payer: Medicare Other | Admitting: Internal Medicine

## 2016-10-26 ENCOUNTER — Encounter: Payer: Self-pay | Admitting: Internal Medicine

## 2016-10-26 VITALS — BP 116/70 | HR 54 | Ht 67.0 in | Wt 148.2 lb

## 2016-10-26 DIAGNOSIS — I341 Nonrheumatic mitral (valve) prolapse: Secondary | ICD-10-CM

## 2016-10-26 DIAGNOSIS — I480 Paroxysmal atrial fibrillation: Secondary | ICD-10-CM | POA: Diagnosis not present

## 2016-10-26 DIAGNOSIS — I5030 Unspecified diastolic (congestive) heart failure: Secondary | ICD-10-CM | POA: Diagnosis not present

## 2016-10-26 MED ORDER — APIXABAN 5 MG PO TABS
5.0000 mg | ORAL_TABLET | Freq: Two times a day (BID) | ORAL | 3 refills | Status: DC
Start: 2016-10-26 — End: 2017-10-23

## 2016-10-26 MED ORDER — METOPROLOL SUCCINATE ER 25 MG PO TB24: 25.0000 mg | ORAL_TABLET | Freq: Every day | ORAL | 3 refills | Status: DC

## 2016-10-26 NOTE — Progress Notes (Signed)
Patient Care Team: Shon Baton, MD as PCP - General (Internal Medicine) Gaye Pollack, MD (Cardiothoracic Surgery) Shon Baton, MD as Consulting Physician (Internal Medicine)   HPI  [name] is a 81 y.o. female Seen in followup for exercise associated dizziness and palpitations with history of chest pain Stress echo demonstrated no echocardiographic changes; images were poor. Left ventricular function was normal  CBC and TSH were normal   Event recorder>>atrial fibrillation. Atenolol resulted in palpitations being much improved   A some point she got switched to metoprolol.   Echo stress test 4/13 demonstrated normal left ventricular function with mild MR and moderate MVP    Echocardiogram 4/15 demonstrated normal LV function no significant atrial enlargement (30/1.66) and normal wall thickness  Date Cr Hgb  1/16 0.67 11.2        She is complaining of fatigue particularly at the end of the day. She denies edema or chest pain. Does not have nocturnal dyspnea or orthopnea. She has had no palpitations. .  Past Medical History:  Diagnosis Date  . Arthritis   . Benign tumor of pleura 2007  . Bursitis   . Cataracts, bilateral   . Complication of anesthesia   . Cough since 05-09-2014   with sore throat  . Fracture    at l1  . Fracture    right arm x 2  . History of blood transfusion age 77 or 55  . Hyperlipidemia   . Paroxysmal atrial fibrillation (HCC)   . PONV (postoperative nausea and vomiting) 48 yrs ago   with ether  . Sarcoidosis    As a child  . Seasonal allergies     Past Surgical History:  Procedure Laterality Date  . ABDOMINAL HYSTERECTOMY     complete  . APPENDECTOMY    . FRACTURE SURGERY Right 2007  . Left video-assisted thoracoscopy, left thoracotomy with wedge resection of left lower lobe lung mass.  Insertion of On-Q pain pump  12/03/2005   [facility]  . ROTATOR CUFF REPAIR Left 2008  . TONSILLECTOMY  as child  . TOTAL HIP ARTHROPLASTY Right  05/18/2014   Procedure: RIGHT TOTAL HIP ARTHROPLASTY ANTERIOR APPROACH;  Surgeon: [name], MD;  Location: [facility];  Service: Orthopedics;  Laterality: Right;  Marland Kitchen VAGINAL HYSTERECTOMY      Current Outpatient Prescriptions  Medication Sig Dispense Refill  . apixaban (ELIQUIS) 5 MG TABS tablet Take 5 mg by mouth 2 (two) times daily.    . Calcium Carbonate-Vitamin D (CALCIUM + D PO) Take 1 tablet by mouth daily.    Marland Kitchen docusate sodium 100 MG CAPS Take 100 mg by mouth 2 (two) times daily. 10 capsule 0  . fish oil-omega-3 fatty acids 1000 MG capsule Take 1 g by mouth daily.     . furosemide (LASIX) 20 MG tablet Take 20 mg by mouth daily as needed for fluid or edema.     Marland Kitchen loratadine (CLARITIN) 10 MG tablet Take 10 mg by mouth daily as needed for allergies.    . Magnesium 250 MG TABS Take 250 mg by mouth daily.    . metoprolol succinate (TOPROL-XL) 50 MG 24 hr tablet Take 1 tablet (50 mg total) by mouth daily. Take with or immediately following a meal. 30 tablet 0  . naproxen sodium (ANAPROX) 220 MG tablet Take 220 mg by mouth 2 (two) times daily with a meal.    . tiZANidine (ZANAFLEX) 4 MG tablet Take 1 tablet (4 mg  total) by mouth every 6 (six) hours as needed for muscle spasms. 30 tablet 0   No current facility-administered medications for this visit.     No Known Allergies  Review of Systems negative except from HPI and PMH  Physical Exam BP 116/70 (BP Location: Left Arm)   Pulse (!) 54   Ht 5\' 7"  (1.702 m)   Wt 148 lb 3.2 oz (67.2 kg)   BMI 23.21 kg/m  Well developed and nourished in no acute distress HENT normal Neck supple with JVP 7 Carotids brisk and full without bruits Clear Somewhat irregular rate and rhythm, no murmurs or gallops Abd-soft with active BS without hepatomegaly No Clubbing cyanosis edema Skin-warm and dry A & Oriented  Grossly normal sensory and motor function    ECG today demonstrated sinus rhythm at 54 Intervals 20/12/47 PVCs-frequent right bundle  inferior axis    Assessment and  Plan  Mitral valve prolapse  Paroxysmal atrial fibrillation   HFpEF   Sinus bradycardia new  PVC new  Exercise intolerance new  She is euvolemic.  She has sinus bradycardia as well as  Perhaps functional bradycardia related to her PVCs.  It may be that the metoprolol has slowed her heart rate more than the atenolol did. We will decrease her metoprolol from 50--25. Hopefully her palpitations will not be significantly aggravated. If bradycardia does not resolve, we will plan to discontinue her beta blocker and follow-up. Alternatives thereafter include pacing and/or an ISA beta blocker.  If her PVCs are persistent with resolution of her bradycardia, we will have to asked the question whether they are further contributing to cardiomyopathy. Quantitation with Holter monitoring followed by echocardiogram would be appropriate  On Anticoagulation;  No bleeding issues   No intercurrent atrial fibrillation or flutter

## 2016-10-26 NOTE — Patient Instructions (Signed)
Medication Instructions: - Your physician has recommended you make the following change in your medication:  1) Decrease toprol (metoprolol succinate) to 25 mg- take 1 tablet by mouth once daily  Labwork: - none ordered  Procedures/Testing: - none ordered  Follow-Up: - Your physician recommends that you schedule a follow-up appointment in: 2-3 weeks with the PA/ NP to follow up on your heart rates   Any Additional Special Instructions Will Be Listed Below (If Applicable).     If you need a refill on your cardiac medications before your next appointment, please call your pharmacy.

## 2016-10-27 LAB — CBC WITH DIFFERENTIAL/PLATELET
BASOS ABS: 0 10*3/uL (ref 0.0–0.2)
BASOS: 1 %
EOS (ABSOLUTE): 0.2 10*3/uL (ref 0.0–0.4)
Eos: 3 %
Hematocrit: 43.3 % (ref 34.0–46.6)
Hemoglobin: 14.1 g/dL (ref 11.1–15.9)
IMMATURE GRANS (ABS): 0 10*3/uL (ref 0.0–0.1)
Immature Granulocytes: 0 %
LYMPHS ABS: 2.3 10*3/uL (ref 0.7–3.1)
Lymphs: 39 %
MCH: 28.8 pg (ref 26.6–33.0)
MCHC: 32.6 g/dL (ref 31.5–35.7)
MCV: 88 fL (ref 79–97)
Monocytes Absolute: 0.7 10*3/uL (ref 0.1–0.9)
Monocytes: 11 %
Neutrophils Absolute: 2.8 10*3/uL (ref 1.4–7.0)
Neutrophils: 46 %
PLATELETS: 209 10*3/uL (ref 150–379)
RBC: 4.9 x10E6/uL (ref 3.77–5.28)
RDW: 13.8 % (ref 12.3–15.4)
WBC: 6 10*3/uL (ref 3.4–10.8)

## 2016-10-27 LAB — BASIC METABOLIC PANEL
BUN / CREAT RATIO: 30 — AB (ref 12–28)
BUN: 25 mg/dL (ref 8–27)
CHLORIDE: 98 mmol/L (ref 96–106)
CO2: 28 mmol/L (ref 20–29)
Calcium: 10 mg/dL (ref 8.7–10.3)
Creatinine, Ser: 0.84 mg/dL (ref 0.57–1.00)
GFR, EST AFRICAN AMERICAN: 77 mL/min/{1.73_m2} (ref >59)
GFR, EST NON AFRICAN AMERICAN: 67 mL/min/{1.73_m2} (ref >59)
Glucose: 75 mg/dL (ref 65–99)
POTASSIUM: 4.9 mmol/L (ref 3.5–5.2)
SODIUM: 140 mmol/L (ref 134–144)

## 2016-11-19 ENCOUNTER — Ambulatory Visit (INDEPENDENT_AMBULATORY_CARE_PROVIDER_SITE_OTHER): Payer: Medicare Other | Admitting: Physician Assistant

## 2016-11-19 ENCOUNTER — Encounter: Payer: Self-pay | Admitting: Physician Assistant

## 2016-11-19 VITALS — BP 110/62 | HR 86 | Ht 67.0 in | Wt 149.8 lb

## 2016-11-19 DIAGNOSIS — I480 Paroxysmal atrial fibrillation: Secondary | ICD-10-CM

## 2016-11-19 DIAGNOSIS — R001 Bradycardia, unspecified: Secondary | ICD-10-CM | POA: Insufficient documentation

## 2016-11-19 DIAGNOSIS — I341 Nonrheumatic mitral (valve) prolapse: Secondary | ICD-10-CM | POA: Insufficient documentation

## 2016-11-19 LAB — BASIC METABOLIC PANEL
BUN / CREAT RATIO: 33 — AB (ref 12–28)
BUN: 30 mg/dL — AB (ref 8–27)
CALCIUM: 10.1 mg/dL (ref 8.7–10.3)
CHLORIDE: 102 mmol/L (ref 96–106)
CO2: 28 mmol/L (ref 20–29)
Creatinine, Ser: 0.92 mg/dL (ref 0.57–1.00)
GFR calc Af Amer: 69 mL/min/{1.73_m2} (ref >59)
GFR calc non Af Amer: 60 mL/min/{1.73_m2} (ref >59)
GLUCOSE: 97 mg/dL (ref 65–99)
Potassium: 4.9 mmol/L (ref 3.5–5.2)
Sodium: 142 mmol/L (ref 134–144)

## 2016-11-19 LAB — CBC WITH DIFFERENTIAL/PLATELET
BASOS ABS: 0 10*3/uL (ref 0.0–0.2)
Basos: 0 %
EOS (ABSOLUTE): 0.2 10*3/uL (ref 0.0–0.4)
Eos: 3 %
Hematocrit: 43.1 % (ref 34.0–46.6)
Hemoglobin: 15 g/dL (ref 11.1–15.9)
LYMPHS ABS: 2.1 10*3/uL (ref 0.7–3.1)
LYMPHS: 30 %
MCH: 29.5 pg (ref 26.6–33.0)
MCHC: 34.8 g/dL (ref 31.5–35.7)
MCV: 85 fL (ref 79–97)
MONOCYTES: 12 %
Monocytes Absolute: 0.8 10*3/uL (ref 0.1–0.9)
NEUTROS ABS: 3.7 10*3/uL (ref 1.4–7.0)
Neutrophils: 55 %
PLATELETS: 193 10*3/uL (ref 150–379)
RBC: 5.09 x10E6/uL (ref 3.77–5.28)
RDW: 13.9 % (ref 12.3–15.4)
WBC: 6.8 10*3/uL (ref 3.4–10.8)

## 2016-11-19 LAB — PROTIME-INR
INR: 0.9 (ref 0.8–1.2)
PROTHROMBIN TIME: 9.6 s (ref 9.1–12.0)

## 2016-11-19 NOTE — Progress Notes (Signed)
Cardiology Office Note    Date:  11/19/2016   ID:  [name], DOB 15-Nov-1935, MRN [id number]  PCP:  Shon Baton, MD  Cardiologist: Dr. Caryl Comes  Chief Complaint  Patient presents with  . Bradycardia  . Follow-up    History of Present Illness:  [name] is a 81 y.o. female with history of MVP, PAF, exercise associated palpitations and dizziness and history of chest pain. Stress echo demonstrated no echo cardiographic changes, images were poor, normal LV function. TSH and CBC were normal. Event recorder showed atrial fibrillation. Atenolol resulted in much improvement and she was eventually switched to metoprolol. Stress echo 07/2011 normal LV function with mild MR and moderate MVP, echo 07/2013 normal LV function with no significant LA enlargement.  Saw Dr. Caryl Comes 10/26/16 and had new sinus bradycardia, PVCs, and exercise intolerance. Metoprolol was decreased from 50 mg to 25 mg.  Patient is here today to follow-up bradycardia. Plan is to discontinue her beta blocker bradycardia does not resolve. Alternatives include pacing and or an eye ASA beta blocker. If her PVCs are persistent with resolution of her bradycardia a questionable be whether they're contributing to her cardiomyopathy. Holter and follow-up echo would be appropriate.  Patient comes in today accompanied by her daughter who is visiting from [place]. Patient walked up in melena to go to the bathroom and felt short of breath. She then felt her heartbeat irregular and says she's been in atrial fibrillation. She hasn't had this in a long time. She feels it skipping but doesn't think it's going to fast. She feels dizzy and short of breath when she is in A. fib.    Past Medical History:  Diagnosis Date  . Arthritis   . Benign tumor of pleura 2007  . Bursitis   . Cataracts, bilateral   . Complication of anesthesia   . Cough since 05-09-2014   with sore throat  . Fracture    at l1  . Fracture    right arm x 2  . History  of blood transfusion age 71 or 48  . Hyperlipidemia   . Paroxysmal atrial fibrillation (HCC)   . PONV (postoperative nausea and vomiting) 48 yrs ago   with ether  . Sarcoidosis    As a child  . Seasonal allergies     Past Surgical History:  Procedure Laterality Date  . ABDOMINAL HYSTERECTOMY     complete  . APPENDECTOMY    . FRACTURE SURGERY Right 2007  . Left video-assisted thoracoscopy, left thoracotomy with wedge resection of left lower lobe lung mass.  Insertion of On-Q pain pump  12/03/2005   [facility]  . ROTATOR CUFF REPAIR Left 2008  . TONSILLECTOMY  as child  . TOTAL HIP ARTHROPLASTY Right 05/18/2014   Procedure: RIGHT TOTAL HIP ARTHROPLASTY ANTERIOR APPROACH;  Surgeon: [name], MD;  Location: [facility];  Service: Orthopedics;  Laterality: Right;  Marland Kitchen VAGINAL HYSTERECTOMY      Current Medications: Current Meds  Medication Sig  . apixaban (ELIQUIS) 5 MG TABS tablet Take 1 tablet (5 mg total) by mouth 2 (two) times daily.  . Calcium Carbonate-Vitamin D (CALCIUM + D PO) Take 1 tablet by mouth daily.  Marland Kitchen docusate sodium 100 MG CAPS Take 100 mg by mouth 2 (two) times daily.  . fish oil-omega-3 fatty acids 1000 MG capsule Take 1 g by mouth daily.   . furosemide (LASIX) 20 MG tablet Take 20 mg by mouth daily as needed for fluid or  edema.   . loratadine (CLARITIN) 10 MG tablet Take 10 mg by mouth daily as needed for allergies.  . Magnesium 250 MG TABS Take 250 mg by mouth daily.  . metoprolol succinate (TOPROL-XL) 25 MG 24 hr tablet Take 1 tablet (25 mg total) by mouth daily.  . naproxen sodium (ANAPROX) 220 MG tablet Take 220 mg by mouth 2 (two) times daily with a meal.  . tiZANidine (ZANAFLEX) 4 MG tablet Take 1 tablet (4 mg total) by mouth every 6 (six) hours as needed for muscle spasms.     Allergies:   Patient has no known allergies.   Social History   Social History  . Marital status: Married    Spouse name: N/A  . Number of children: 2  . Years of education: N/A    Occupational History  . retired Retired   Social History Main Topics  . Smoking status: Never Smoker  . Smokeless tobacco: Never Used  . Alcohol use Yes     Comment: occasional  . Drug use: No  . Sexual activity: Not Asked   Other Topics Concern  . None   Social History Narrative   Lives with husband     Family History:  The patient's family history includes Cancer in her brother and maternal grandfather; Heart disease in her brother and father.   ROS:   Please see the history of present illness.    Review of Systems  HENT: Negative.   Eyes: Negative.   Cardiovascular: Positive for dyspnea on exertion and irregular heartbeat.  Respiratory: Negative.   Hematologic/Lymphatic: Negative.   Musculoskeletal: Negative.  Negative for joint pain.  Gastrointestinal: Negative.   Genitourinary: Negative.   Neurological: Positive for dizziness.   All other systems reviewed and are negative.   PHYSICAL EXAM:   VS:  BP 110/62 (BP Location: Left Arm, Patient Position: Sitting, Cuff Size: Normal)   Pulse 86   Ht 5\' 7"  (1.702 m)   Wt 149 lb 12.8 oz (67.9 kg)   BMI 23.46 kg/m   Physical Exam  GEN: Well nourished, well developed, in no acute distress  Neck: no JVD, carotid bruits, or masses Cardiac:RRR; no murmurs, rubs, or gallops  Respiratory:  clear to auscultation bilaterally, normal work of breathing GI: soft, nontender, nondistended, + BS Ext: without cyanosis, clubbing, or edema, Good distal pulses bilaterally MS: no deformity or atrophy  Skin: warm and dry, no rash Neuro:  Alert and Oriented x 3 Psych: euthymic mood, full affect  Wt Readings from Last 3 Encounters:  11/19/16 149 lb 12.8 oz (67.9 kg)  10/26/16 148 lb 3.2 oz (67.2 kg)  04/04/16 142 lb (64.4 kg)      Studies/Labs Reviewed:   EKG:  EKG is ordered today.  The ekg ordered today demonstrates Atrial fibrillation at 86 bpm with left bundle branch block  Recent Labs: 10/26/2016: BUN 25; Creatinine, Ser  0.84; Hemoglobin 14.1; Platelets 209; Potassium 4.9; Sodium 140   Lipid Panel No results found for: CHOL, TRIG, HDL, CHOLHDL, VLDL, LDLCALC, LDLDIRECT  Additional studies/ records that were reviewed today include:  2-D echo 2015Study Conclusions  - Left ventricle: The cavity size was normal. Wall thickness   was normal. Systolic function was normal. The estimated   ejection fraction was in the range of 55% to 60%. - Aortic valve: Trivial regurgitation. - Right atrium: The atrium was mildly dilated. - Atrial septum: No defect or patent foramen ovale was   identified. - Pericardium, extracardiac: A trivial pericardial  effusion   was identified posterior to the heart. Transthoracic echocardiography.  M-mode, complete 2D, spectral Doppler, and color Doppler.  Height:  Height: 170.2cm. Height: 67in.  Weight:  Weight: 70.3kg. Weight: 154.7lb.  Body mass index:  BMI: 24.3kg/m^2.  Body surface area:    BSA: 1.58m^2.  Blood pressure:     116/63.  Patient status:  Outpatient.  Location:  [facility]     ASSESSMENT:    1. Paroxysmal atrial fibrillation (HCC)   2. Sinus bradycardia   3. MVP (mitral valve prolapse)      PLAN:  In order of problems listed above:   Paroxysmal atrial fibrillation on long term Eliquis. She has not missed any doses. Patient has not been in atrial fibrillation and a long time according to her symptoms. He feels poorly when she is in A. fib. Discussed this patient in detail with Dr. Caryl Comes who recommends cardioversion. Discussed with patient who is agreeable.Will check labs today.  Sinus bradycardia necessitating decrease in metoprolol to 25 mg daily. Continue the same dose.  MVP followed by Dr. Caryl Comes   Medication Adjustments/Labs and Tests Ordered: Current medicines are reviewed at length with the patient today.  Concerns regarding medicines are outlined above.  Medication changes, Labs and Tests ordered today are listed in the Patient Instructions  below. There are no Patient Instructions on file for this visit.   Sumner Boast, PA-C  11/19/2016 9:35 AM    [facility] [place], [place], [place]  [address] Phone: [phone]; Fax: [phone]

## 2016-11-19 NOTE — Patient Instructions (Addendum)
Medication Instructions:  Your physician recommends that you continue on your current medications as directed. Please refer to the Current Medication list given to you today.  Labwork: Your physician recommends that you have lab work today BMET, CBC, PT/INR   Testing/Procedures: Your physician has recommended that you have a Cardioversion (DCCV). Electrical Cardioversion uses a jolt of electricity to your heart either through paddles or wired patches attached to your chest. This is a controlled, usually prescheduled, procedure. Defibrillation is done under light anesthesia in the hospital, and you usually go home the day of the procedure. This is done to get your heart back into a normal rhythm. You are not awake for the procedure. Please see the instruction sheet given to you today.  Follow-Up: Your physician recommends that you schedule a follow-up appointment in: 2 weeks with Dr. Caryl Comes.      If you need a refill on your cardiac medications before your next appointment, please call your pharmacy.

## 2016-11-20 ENCOUNTER — Encounter (HOSPITAL_COMMUNITY)
Admission: RE | Disposition: A | Discharge status: Home / Self Care | Payer: Self-pay | Source: Ambulatory Visit | Attending: Cardiology

## 2016-11-20 ENCOUNTER — Ambulatory Visit (HOSPITAL_COMMUNITY)
Admission: RE | Admit: 2016-11-20 | Discharge: 2016-11-20 | Disposition: A | Discharge status: Home / Self Care | Payer: Medicare Other | Source: Ambulatory Visit | Attending: Cardiology | Admitting: Cardiology

## 2016-11-20 ENCOUNTER — Encounter (HOSPITAL_COMMUNITY): Payer: Self-pay | Admitting: Certified Registered Nurse Anesthetist

## 2016-11-20 DIAGNOSIS — Z7901 Long term (current) use of anticoagulants: Secondary | ICD-10-CM | POA: Diagnosis not present

## 2016-11-20 DIAGNOSIS — Z96641 Presence of right artificial hip joint: Secondary | ICD-10-CM | POA: Insufficient documentation

## 2016-11-20 DIAGNOSIS — I480 Paroxysmal atrial fibrillation: Secondary | ICD-10-CM | POA: Diagnosis not present

## 2016-11-20 DIAGNOSIS — I481 Persistent atrial fibrillation: Secondary | ICD-10-CM

## 2016-11-20 DIAGNOSIS — I493 Ventricular premature depolarization: Secondary | ICD-10-CM | POA: Insufficient documentation

## 2016-11-20 DIAGNOSIS — M1990 Unspecified osteoarthritis, unspecified site: Secondary | ICD-10-CM | POA: Diagnosis not present

## 2016-11-20 DIAGNOSIS — I447 Left bundle-branch block, unspecified: Secondary | ICD-10-CM | POA: Diagnosis not present

## 2016-11-20 DIAGNOSIS — Z538 Procedure and treatment not carried out for other reasons: Secondary | ICD-10-CM | POA: Diagnosis not present

## 2016-11-20 DIAGNOSIS — I341 Nonrheumatic mitral (valve) prolapse: Secondary | ICD-10-CM | POA: Diagnosis not present

## 2016-11-20 DIAGNOSIS — E785 Hyperlipidemia, unspecified: Secondary | ICD-10-CM | POA: Insufficient documentation

## 2016-11-20 DIAGNOSIS — Z79899 Other long term (current) drug therapy: Secondary | ICD-10-CM | POA: Insufficient documentation

## 2016-11-20 SURGERY — CANCELLED PROCEDURE

## 2016-11-20 MED ORDER — SODIUM CHLORIDE 0.9 % IV SOLN: 250.0000 mL | INTRAVENOUS | Status: DC

## 2016-11-20 MED ORDER — SODIUM CHLORIDE 0.9% FLUSH: 3.0000 mL | INTRAVENOUS | Status: DC | PRN

## 2016-11-20 MED ORDER — SODIUM CHLORIDE 0.9% FLUSH: 3.0000 mL | Freq: Two times a day (BID) | INTRAVENOUS | Status: DC

## 2016-11-20 MED ORDER — HYDROCORTISONE 1 % EX CREA
1.0000 | TOPICAL_CREAM | Freq: Three times a day (TID) | CUTANEOUS | Status: DC | PRN
Start: 2016-11-20 — End: 2016-11-20

## 2016-11-20 NOTE — H&P (View-Only) (Signed)
Cardiology Office Note    Date:  11/19/2016   ID:  [name], DOB 1935/12/22, MRN [id number]  PCP:  Shon Baton, MD  Cardiologist: Dr. Caryl Comes  Chief Complaint  Patient presents with  . Bradycardia  . Follow-up    History of Present Illness:  [name] is a 81 y.o. female with history of MVP, PAF, exercise associated palpitations and dizziness and history of chest pain. Stress echo demonstrated no echo cardiographic changes, images were poor, normal LV function. TSH and CBC were normal. Event recorder showed atrial fibrillation. Atenolol resulted in much improvement and she was eventually switched to metoprolol. Stress echo 07/2011 normal LV function with mild MR and moderate MVP, echo 07/2013 normal LV function with no significant LA enlargement.  Saw Dr. Caryl Comes 10/26/16 and had new sinus bradycardia, PVCs, and exercise intolerance. Metoprolol was decreased from 50 mg to 25 mg.  Patient is here today to follow-up bradycardia. Plan is to discontinue her beta blocker bradycardia does not resolve. Alternatives include pacing and or an eye ASA beta blocker. If her PVCs are persistent with resolution of her bradycardia a questionable be whether they're contributing to her cardiomyopathy. Holter and follow-up echo would be appropriate.  Patient comes in today accompanied by her daughter who is visiting from [place]. Patient walked up in melena to go to the bathroom and felt short of breath. She then felt her heartbeat irregular and says she's been in atrial fibrillation. She hasn't had this in a long time. She feels it skipping but doesn't think it's going to fast. She feels dizzy and short of breath when she is in A. fib.    Past Medical History:  Diagnosis Date  . Arthritis   . Benign tumor of pleura 2007  . Bursitis   . Cataracts, bilateral   . Complication of anesthesia   . Cough since 05-09-2014   with sore throat  . Fracture    at l1  . Fracture    right arm x 2  . History  of blood transfusion age 56 or 67  . Hyperlipidemia   . Paroxysmal atrial fibrillation (HCC)   . PONV (postoperative nausea and vomiting) 48 yrs ago   with ether  . Sarcoidosis    As a child  . Seasonal allergies     Past Surgical History:  Procedure Laterality Date  . ABDOMINAL HYSTERECTOMY     complete  . APPENDECTOMY    . FRACTURE SURGERY Right 2007  . Left video-assisted thoracoscopy, left thoracotomy with wedge resection of left lower lobe lung mass.  Insertion of On-Q pain pump  12/03/2005   B[facility]le  . ROTATOR CUFF REPAIR Left 2008  . TONSILLECTOMY  as child  . TOTAL HIP ARTHROPLASTY Right 05/18/2014   Procedure: RIGHT TOTAL HIP ARTHROPLASTY ANTERIOR APPROACH;  Surgeon: [name], MD;  Location: [facility];  Service: Orthopedics;  Laterality: Right;  Marland Kitchen VAGINAL HYSTERECTOMY      Current Medications: Current Meds  Medication Sig  . apixaban (ELIQUIS) 5 MG TABS tablet Take 1 tablet (5 mg total) by mouth 2 (two) times daily.  . Calcium Carbonate-Vitamin D (CALCIUM + D PO) Take 1 tablet by mouth daily.  Marland Kitchen docusate sodium 100 MG CAPS Take 100 mg by mouth 2 (two) times daily.  . fish oil-omega-3 fatty acids 1000 MG capsule Take 1 g by mouth daily.   . furosemide (LASIX) 20 MG tablet Take 20 mg by mouth daily as needed for fluid or  edema.   . loratadine (CLARITIN) 10 MG tablet Take 10 mg by mouth daily as needed for allergies.  . Magnesium 250 MG TABS Take 250 mg by mouth daily.  . metoprolol succinate (TOPROL-XL) 25 MG 24 hr tablet Take 1 tablet (25 mg total) by mouth daily.  . naproxen sodium (ANAPROX) 220 MG tablet Take 220 mg by mouth 2 (two) times daily with a meal.  . tiZANidine (ZANAFLEX) 4 MG tablet Take 1 tablet (4 mg total) by mouth every 6 (six) hours as needed for muscle spasms.     Allergies:   Patient has no known allergies.   Social History   Social History  . Marital status: Married    Spouse name: N/A  . Number of children: 2  . Years of education: N/A    Occupational History  . retired Retired   Social History Main Topics  . Smoking status: Never Smoker  . Smokeless tobacco: Never Used  . Alcohol use Yes     Comment: occasional  . Drug use: No  . Sexual activity: Not Asked   Other Topics Concern  . None   Social History Narrative   Lives with husband     Family History:  The patient's family history includes Cancer in her brother and maternal grandfather; Heart disease in her brother and father.   ROS:   Please see the history of present illness.    Review of Systems  HENT: Negative.   Eyes: Negative.   Cardiovascular: Positive for dyspnea on exertion and irregular heartbeat.  Respiratory: Negative.   Hematologic/Lymphatic: Negative.   Musculoskeletal: Negative.  Negative for joint pain.  Gastrointestinal: Negative.   Genitourinary: Negative.   Neurological: Positive for dizziness.   All other systems reviewed and are negative.   PHYSICAL EXAM:   VS:  BP 110/62 (BP Location: Left Arm, Patient Position: Sitting, Cuff Size: Normal)   Pulse 86   Ht 5\' 7"  (1.702 m)   Wt 149 lb 12.8 oz (67.9 kg)   BMI 23.46 kg/m   Physical Exam  GEN: Well nourished, well developed, in no acute distress  Neck: no JVD, carotid bruits, or masses Cardiac:RRR; no murmurs, rubs, or gallops  Respiratory:  clear to auscultation bilaterally, normal work of breathing GI: soft, nontender, nondistended, + BS Ext: without cyanosis, clubbing, or edema, Good distal pulses bilaterally MS: no deformity or atrophy  Skin: warm and dry, no rash Neuro:  Alert and Oriented x 3 Psych: euthymic mood, full affect  Wt Readings from Last 3 Encounters:  11/19/16 149 lb 12.8 oz (67.9 kg)  10/26/16 148 lb 3.2 oz (67.2 kg)  04/04/16 142 lb (64.4 kg)      Studies/Labs Reviewed:   EKG:  EKG is ordered today.  The ekg ordered today demonstrates Atrial fibrillation at 86 bpm with left bundle branch block  Recent Labs: 10/26/2016: BUN 25; Creatinine, Ser  0.84; Hemoglobin 14.1; Platelets 209; Potassium 4.9; Sodium 140   Lipid Panel No results found for: CHOL, TRIG, HDL, CHOLHDL, VLDL, LDLCALC, LDLDIRECT  Additional studies/ records that were reviewed today include:  2-D echo 2015Study Conclusions  - Left ventricle: The cavity size was normal. Wall thickness   was normal. Systolic function was normal. The estimated   ejection fraction was in the range of 55% to 60%. - Aortic valve: Trivial regurgitation. - Right atrium: The atrium was mildly dilated. - Atrial septum: No defect or patent foramen ovale was   identified. - Pericardium, extracardiac: A trivial pericardial  effusion   was identified posterior to the heart. Transthoracic echocardiography.  M-mode, complete 2D, spectral Doppler, and color Doppler.  Height:  Height: 170.2cm. Height: 67in.  Weight:  Weight: 70.3kg. Weight: 154.7lb.  Body mass index:  BMI: 24.3kg/m^2.  Body surface area:    BSA: 1.62m^2.  Blood pressure:     116/63.  Patient status:  Outpatient.  Location:  [facility]     ASSESSMENT:    1. Paroxysmal atrial fibrillation (HCC)   2. Sinus bradycardia   3. MVP (mitral valve prolapse)      PLAN:  In order of problems listed above:   Paroxysmal atrial fibrillation on long term Eliquis. She has not missed any doses. Patient has not been in atrial fibrillation and a long time according to her symptoms. He feels poorly when she is in A. fib. Discussed this patient in detail with Dr. Caryl Comes who recommends cardioversion. Discussed with patient who is agreeable.Will check labs today.  Sinus bradycardia necessitating decrease in metoprolol to 25 mg daily. Continue the same dose.  MVP followed by Dr. Caryl Comes   Medication Adjustments/Labs and Tests Ordered: Current medicines are reviewed at length with the patient today.  Concerns regarding medicines are outlined above.  Medication changes, Labs and Tests ordered today are listed in the Patient Instructions  below. There are no Patient Instructions on file for this visit.   Sumner Boast, PA-C  11/19/2016 9:35 AM    [facility] [place], [place], [place]  [address] Phone: [phone]; Fax: [phone]

## 2016-11-20 NOTE — Interval H&P Note (Signed)
History and Physical Interval Note:  11/20/2016 8:53 AM  [name]  has presented today for surgery, with the diagnosis of AFIB  The various methods of treatment have been discussed with the patient and family. After consideration of risks, benefits and other options for treatment, the patient has consented to  Procedure(s): CARDIOVERSION (N/A) as a surgical intervention .  The patient's history has been reviewed, patient examined, no change in status, stable for surgery.  I have reviewed the patient's chart and labs.  Questions were answered to the patient's satisfaction.     Fransico Him

## 2016-11-20 NOTE — Progress Notes (Signed)
Patient arrive to endoscopy for outpatient cardioversion today. Sinus rhythm on monitor, 12 lead EKG confirms same. Order received from Dr. Radford Pax to cancel cardioversion for patient self converted. Patient informs of this and agrees, discharged to home with self care, will keep outpatient follow up already scheduled and call MD for any return to irregular rhythm.

## 2016-11-20 NOTE — Progress Notes (Signed)
Patient presented today for cardioversion and was found to be in NSR.  Patient discharged and instructed to followup with Dr. Caryl Comes on 8/7 for appt that was already scheduled.

## 2016-11-28 DIAGNOSIS — H401232 Low-tension glaucoma, bilateral, moderate stage: Secondary | ICD-10-CM | POA: Diagnosis not present

## 2016-11-28 DIAGNOSIS — H1013 Acute atopic conjunctivitis, bilateral: Secondary | ICD-10-CM | POA: Diagnosis not present

## 2016-11-28 DIAGNOSIS — Z961 Presence of intraocular lens: Secondary | ICD-10-CM | POA: Diagnosis not present

## 2016-11-28 DIAGNOSIS — H35361 Drusen (degenerative) of macula, right eye: Secondary | ICD-10-CM | POA: Diagnosis not present

## 2016-12-04 ENCOUNTER — Ambulatory Visit (INDEPENDENT_AMBULATORY_CARE_PROVIDER_SITE_OTHER): Payer: Medicare Other | Admitting: Internal Medicine

## 2016-12-04 ENCOUNTER — Encounter: Payer: Self-pay | Admitting: Internal Medicine

## 2016-12-04 VITALS — BP 120/68 | HR 72 | Ht 67.0 in | Wt 147.6 lb

## 2016-12-04 DIAGNOSIS — R001 Bradycardia, unspecified: Secondary | ICD-10-CM

## 2016-12-04 DIAGNOSIS — I480 Paroxysmal atrial fibrillation: Secondary | ICD-10-CM | POA: Diagnosis not present

## 2016-12-04 DIAGNOSIS — I493 Ventricular premature depolarization: Secondary | ICD-10-CM | POA: Diagnosis not present

## 2016-12-04 DIAGNOSIS — I341 Nonrheumatic mitral (valve) prolapse: Secondary | ICD-10-CM | POA: Diagnosis not present

## 2016-12-04 NOTE — Patient Instructions (Addendum)
Medication Instructions:  Your physician recommends that you continue on your current medications as directed. Please refer to the Current Medication list given to you today.   Labwork: None ordered   Testing/Procedures: None ordered   Follow-Up: Your physician wants you to follow-up in: December 2018.    Any Other Special Instructions Will Be Listed Below (If Applicable).     If you need a refill on your cardiac medications before your next appointment, please call your pharmacy.

## 2016-12-04 NOTE — Progress Notes (Signed)
Patient Care Team: Shon Baton, MD as PCP - General (Internal Medicine) Gaye Pollack, MD (Cardiothoracic Surgery) Shon Baton, MD as Consulting Physician (Internal Medicine)   HPI  [name] is a 81 y.o. female Seen in followup for exercise associated dizziness and palpitations with history of chest pain Stress echo demonstrated no echocardiographic changes; images were poor. Left ventricular function was normal  CBC and TSH were normal   Event recorder>>atrial fibrillation. Atenolol resulted in palpitations being much improved   A some point she got switched to metoprolol. She was seen June 18 with bradycardia. It  was thought to be partly related to PVCs and perhaps the bradycardia was aggravating the PVCs. Her beta blockers were decreased. She was seen 2 weeks ago by [facility] and was found to be in atrial fibrillation. Cardioversion was scheduled but she reverted spontaneously.  Echo stress test 4/13 demonstrated normal left ventricular function with mild MR and moderate MVP    Echocardiogram 4/15 demonstrated normal LV function no significant atrial enlargement (30/1.66) and normal wall thickness  Date Cr Hgb  1/16 0.67 11.2  7/18  0.92 15.0    She Continues to have some fatigue. It is much better since atrial fibrillation stop on its own. Most of her atrial fibrillation episodes have been less than 24 hours duration.  She's concerned about her husband's short-term memory  Past Medical History:  Diagnosis Date  . Arthritis   . Benign tumor of pleura 2007  . Bursitis   . Cataracts, bilateral   . Complication of anesthesia   . Cough since 05-09-2014   with sore throat  . Fracture    at l1  . Fracture    right arm x 2  . History of blood transfusion age 73 or 68  . Hyperlipidemia   . Paroxysmal atrial fibrillation (HCC)   . PONV (postoperative nausea and vomiting) 48 yrs ago   with ether  . Sarcoidosis    As a child  . Seasonal allergies     Past Surgical  History:  Procedure Laterality Date  . ABDOMINAL HYSTERECTOMY     complete  . APPENDECTOMY    . FRACTURE SURGERY Right 2007  . Left video-assisted thoracoscopy, left thoracotomy with wedge resection of left lower lobe lung mass.  Insertion of On-Q pain pump  12/03/2005   [facility]  . ROTATOR CUFF REPAIR Left 2008  . TONSILLECTOMY  as child  . TOTAL HIP ARTHROPLASTY Right 05/18/2014   Procedure: RIGHT TOTAL HIP ARTHROPLASTY ANTERIOR APPROACH;  Surgeon: [name], MD;  Location: [facility];  Service: Orthopedics;  Laterality: Right;  Marland Kitchen VAGINAL HYSTERECTOMY      Current Outpatient Prescriptions  Medication Sig Dispense Refill  . apixaban (ELIQUIS) 5 MG TABS tablet Take 1 tablet (5 mg total) by mouth 2 (two) times daily. 180 tablet 3  . Calcium Carbonate-Vitamin D (CALCIUM + D PO) Take 1 tablet by mouth daily.    Marland Kitchen docusate sodium 100 MG CAPS Take 100 mg by mouth 2 (two) times daily. 10 capsule 0  . fish oil-omega-3 fatty acids 1000 MG capsule Take 1 g by mouth daily.     . furosemide (LASIX) 20 MG tablet Take 20 mg by mouth daily as needed for fluid or edema.     Marland Kitchen ibuprofen (ADVIL,MOTRIN) 200 MG tablet Take 400 mg by mouth every 6 (six) hours as needed (pain). Pt rarely takes    . loratadine (CLARITIN) 10 MG tablet Take  10 mg by mouth daily as needed for allergies.    . Magnesium 250 MG TABS Take 250 mg by mouth daily.    . metoprolol succinate (TOPROL-XL) 25 MG 24 hr tablet Take 1 tablet (25 mg total) by mouth daily. 90 tablet 3  . naproxen sodium (ANAPROX) 220 MG tablet Take 220 mg by mouth 2 (two) times daily as needed (pain). Pt rarely takes    . tiZANidine (ZANAFLEX) 4 MG tablet Take 1 tablet (4 mg total) by mouth every 6 (six) hours as needed for muscle spasms. 30 tablet 0   No current facility-administered medications for this visit.     Allergies  Allergen Reactions  . Poison Ivy Extract     Severe hives, needed steroid taper last time    Review of Systems negative except from  HPI and PMH  Physical Exam BP 120/68   Pulse 72   Ht 5\' 7"  (1.702 m)   Wt 147 lb 9.6 oz (67 kg)   SpO2 95%   BMI 23.12 kg/m  Well developed and nourished in no acute distress HENT normal Neck supple with JVP-flat Carotids brisk and full without bruits Clear Regular rate and rhythm, no murmurs or gallops Abd-soft with active BS without hepatomegaly No Clubbing cyanosis edema Skin-warm and dry A & Oriented  Grossly normal sensory and motor function    ECG today demonstrated sinus rhythm at 72 Intervals 28/12/43 Axis -70 PACs but no PVCs    Assessment and  Plan  Mitral valve prolapse  Paroxysmal atrial fibrillation   HFpEF   Sinus bradycardia resolcved  PVC quiescient  Exercise intolerance new    She is euvolemic. For functional status is improved with a slightly faster heart rate.   In the event that she has recurrent atrial fibrillation, I suggested that we wait about 24 hours to see if it doesn't revert spontaneously before we embark on a effort for cardioversion.   I've been in touch with Dr. Virgina Jock to discuss with him the concerns that she has regarding her husband's short-term memory. r

## 2016-12-17 ENCOUNTER — Encounter (HOSPITAL_COMMUNITY): Payer: Self-pay | Admitting: Cardiology

## 2016-12-20 ENCOUNTER — Ambulatory Visit: Payer: Medicare Other | Admitting: Cardiology

## 2017-01-07 DIAGNOSIS — H401212 Low-tension glaucoma, right eye, moderate stage: Secondary | ICD-10-CM | POA: Diagnosis not present

## 2017-01-21 DIAGNOSIS — H401221 Low-tension glaucoma, left eye, mild stage: Secondary | ICD-10-CM | POA: Diagnosis not present

## 2017-02-04 DIAGNOSIS — E7849 Other hyperlipidemia: Secondary | ICD-10-CM | POA: Diagnosis not present

## 2017-02-04 DIAGNOSIS — E559 Vitamin D deficiency, unspecified: Secondary | ICD-10-CM | POA: Diagnosis not present

## 2017-02-04 DIAGNOSIS — Z0000 Encounter for general adult medical examination without abnormal findings: Secondary | ICD-10-CM | POA: Diagnosis not present

## 2017-02-11 DIAGNOSIS — Z23 Encounter for immunization: Secondary | ICD-10-CM | POA: Diagnosis not present

## 2017-02-11 DIAGNOSIS — I480 Paroxysmal atrial fibrillation: Secondary | ICD-10-CM | POA: Diagnosis not present

## 2017-02-11 DIAGNOSIS — Z9189 Other specified personal risk factors, not elsewhere classified: Secondary | ICD-10-CM | POA: Diagnosis not present

## 2017-02-11 DIAGNOSIS — Z0000 Encounter for general adult medical examination without abnormal findings: Secondary | ICD-10-CM | POA: Diagnosis not present

## 2017-02-11 DIAGNOSIS — D869 Sarcoidosis, unspecified: Secondary | ICD-10-CM | POA: Diagnosis not present

## 2017-02-11 DIAGNOSIS — E7849 Other hyperlipidemia: Secondary | ICD-10-CM | POA: Diagnosis not present

## 2017-02-11 DIAGNOSIS — I341 Nonrheumatic mitral (valve) prolapse: Secondary | ICD-10-CM | POA: Diagnosis not present

## 2017-02-11 DIAGNOSIS — Z1389 Encounter for screening for other disorder: Secondary | ICD-10-CM | POA: Diagnosis not present

## 2017-02-11 DIAGNOSIS — M859 Disorder of bone density and structure, unspecified: Secondary | ICD-10-CM | POA: Diagnosis not present

## 2017-02-11 DIAGNOSIS — I5189 Other ill-defined heart diseases: Secondary | ICD-10-CM | POA: Diagnosis not present

## 2017-02-11 DIAGNOSIS — I471 Supraventricular tachycardia: Secondary | ICD-10-CM | POA: Diagnosis not present

## 2017-02-11 DIAGNOSIS — Z6824 Body mass index (BMI) 24.0-24.9, adult: Secondary | ICD-10-CM | POA: Diagnosis not present

## 2017-02-15 DIAGNOSIS — Z1212 Encounter for screening for malignant neoplasm of rectum: Secondary | ICD-10-CM | POA: Diagnosis not present

## 2017-03-27 DIAGNOSIS — H04123 Dry eye syndrome of bilateral lacrimal glands: Secondary | ICD-10-CM | POA: Diagnosis not present

## 2017-03-27 DIAGNOSIS — H04213 Epiphora due to excess lacrimation, bilateral lacrimal glands: Secondary | ICD-10-CM | POA: Diagnosis not present

## 2017-03-27 DIAGNOSIS — H401221 Low-tension glaucoma, left eye, mild stage: Secondary | ICD-10-CM | POA: Diagnosis not present

## 2017-03-27 DIAGNOSIS — H401212 Low-tension glaucoma, right eye, moderate stage: Secondary | ICD-10-CM | POA: Diagnosis not present

## 2017-04-02 ENCOUNTER — Other Ambulatory Visit: Payer: Self-pay | Admitting: Surgery

## 2017-04-02 DIAGNOSIS — D190 Benign neoplasm of mesothelial tissue of pleura: Secondary | ICD-10-CM

## 2017-04-02 DIAGNOSIS — Z951 Presence of aortocoronary bypass graft: Secondary | ICD-10-CM

## 2017-04-03 ENCOUNTER — Ambulatory Visit
Admission: RE | Admit: 2017-04-03 | Discharge: 2017-04-03 | Disposition: A | Discharge status: Home / Self Care | Payer: Medicare Other | Source: Ambulatory Visit | Attending: Surgery | Admitting: Surgery

## 2017-04-03 ENCOUNTER — Other Ambulatory Visit: Payer: Self-pay

## 2017-04-03 ENCOUNTER — Encounter: Payer: Self-pay | Admitting: Surgery

## 2017-04-03 ENCOUNTER — Ambulatory Visit (INDEPENDENT_AMBULATORY_CARE_PROVIDER_SITE_OTHER): Payer: Medicare Other | Admitting: Surgery

## 2017-04-03 VITALS — BP 129/74 | HR 71 | Resp 18 | Ht 67.0 in | Wt 140.0 lb

## 2017-04-03 DIAGNOSIS — Z86018 Personal history of other benign neoplasm: Secondary | ICD-10-CM

## 2017-04-03 DIAGNOSIS — R918 Other nonspecific abnormal finding of lung field: Secondary | ICD-10-CM | POA: Diagnosis not present

## 2017-04-03 DIAGNOSIS — D190 Benign neoplasm of mesothelial tissue of pleura: Secondary | ICD-10-CM

## 2017-04-03 DIAGNOSIS — Z8709 Personal history of other diseases of the respiratory system: Secondary | ICD-10-CM

## 2017-04-03 NOTE — Progress Notes (Signed)
HPI:  The patient returns to my office today for followup status post left thoracotomy and wedge resection of a left lower lobe lung mass on 12/03/2005. 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 Since I saw her about 1 year ago she has continued to do well. She denies any chest pain. She's had no shortness of breath. She denies any cough or hemoptysis.  Since I saw her last year she has had some and is now on Eliquis.  She is followed by Dr. Caryl Comes.    Current Outpatient Medications  Medication Sig Dispense Refill  . apixaban (ELIQUIS) 5 MG TABS tablet Take 1 tablet (5 mg total) by mouth 2 (two) times daily. 180 tablet 3  . Calcium Carbonate-Vitamin D (CALCIUM + D PO) Take 1 tablet by mouth daily.    Marland Kitchen docusate sodium 100 MG CAPS Take 100 mg by mouth 2 (two) times daily. 10 capsule 0  . fish oil-omega-3 fatty acids 1000 MG capsule Take 1 g by mouth daily.     . furosemide (LASIX) 20 MG tablet Take 20 mg by mouth daily as needed for fluid or edema.     Marland Kitchen ibuprofen (ADVIL,MOTRIN) 200 MG tablet Take 400 mg by mouth every 6 (six) hours as needed (pain). Pt rarely takes    . loratadine (CLARITIN) 10 MG tablet Take 10 mg by mouth daily as needed for allergies.    . Magnesium 250 MG TABS Take 250 mg by mouth daily.    . metoprolol succinate (TOPROL-XL) 25 MG 24 hr tablet Take 1 tablet (25 mg total) by mouth daily. 90 tablet 3  . naproxen sodium (ANAPROX) 220 MG tablet Take 220 mg by mouth 2 (two) times daily as needed (pain). Pt rarely takes    . tiZANidine (ZANAFLEX) 4 MG tablet Take 1 tablet (4 mg total) by mouth every 6 (six) hours as needed for muscle spasms. 30 tablet 0   No current facility-administered medications for this visit.      Physical Exam: BP 129/74   Pulse 71   Resp 18   Ht 5\' 7"  (1.702 m)   Wt 140 lb (63.5 kg)   SpO2 95% Comment: on RA  BMI 21.93 kg/m  Lung exam is clear. Cardiac exam shows a regular rate and rhythm with normal heart sounds. The  left chest incision looks fine. There is a small mobile mass in the subcutaneous tissue of the left back that looks like a lipoma and is unchanged from prior exam.    Diagnostic Tests:  CLINICAL DATA:  History of benign fibrous pleural tumor. Also history of sarcoidosis, paroxysms mole atrial fibrillation, never smoked.  EXAM: CHEST  2 VIEW  COMPARISON:  PA and lateral chest x-ray of April 04, 2016  FINDINGS: The lungs are hyperinflated. There is no focal infiltrate. There is no pleural effusion. The heart and pulmonary vascularity are normal. The mediastinum is normal in width. There is stable gentle dextrocurvature centered in the upper thoracic spine.  IMPRESSION: There is no active cardiopulmonary disease.   Electronically Signed   By: David  Martinique M.D.   On: 04/03/2017 09:35  Impression:  She continues to do well following resection of a solitary fibrous tumor of the pleura in August of 2007.   Plan:   I'll see her back in one year with a repeat chest x-ray.     Gaye Pollack, MD [facility] [phone]

## 2017-04-10 ENCOUNTER — Ambulatory Visit (INDEPENDENT_AMBULATORY_CARE_PROVIDER_SITE_OTHER): Payer: Medicare Other | Admitting: Internal Medicine

## 2017-04-10 ENCOUNTER — Encounter: Payer: Self-pay | Admitting: Internal Medicine

## 2017-04-10 VITALS — BP 112/60 | HR 72 | Ht 66.0 in | Wt 143.0 lb

## 2017-04-10 DIAGNOSIS — I493 Ventricular premature depolarization: Secondary | ICD-10-CM

## 2017-04-10 DIAGNOSIS — I341 Nonrheumatic mitral (valve) prolapse: Secondary | ICD-10-CM | POA: Diagnosis not present

## 2017-04-10 DIAGNOSIS — I5030 Unspecified diastolic (congestive) heart failure: Secondary | ICD-10-CM

## 2017-04-10 DIAGNOSIS — I480 Paroxysmal atrial fibrillation: Secondary | ICD-10-CM

## 2017-04-10 NOTE — Patient Instructions (Signed)
Medication Instructions: Your physician recommends that you continue on your current medications as directed. Please refer to the Current Medication list given to you today. Labwork: None Ordered  Procedures/Testing: None Ordered  Follow-Up: Your physician recommends that you schedule a follow-up appointment in: 6 MONTHS with Dr. Caryl Comes.    If you need a refill on your cardiac medications before your next appointment, please call your pharmacy.

## 2017-04-10 NOTE — Progress Notes (Signed)
Patient Care Team: Shon Baton, MD as PCP - General (Internal Medicine) Gaye Pollack, MD (Cardiothoracic Surgery) Shon Baton, MD as Consulting Physician (Internal Medicine)   HPI  [name] is a 81 y.o. female Seen in followup for chief complaint of palpitations  associated w dizziness and fatigue History of chest pain but none recently  Stress echo demonstrated no echocardiographic changes; images were poor.   Event recorder>>atrial fibrillation. She is managed with apixoban and Is without bleeding issues  Atenolol resulted in palpitations being much improved   A some point she got switched to metoprolol. Subsequently  bradycardia. It  was thought to be partly related to PVCs and perhaps the bradycardia was aggravating the PVCs. Her beta blockers were decreased.  \ She has had recurrent paroxysms of atrial fibrillation  Cardioversion was scheduled but she reverted spontaneously.  Echo stress test 4/13 demonstrated normal left ventricular function with mild MR and moderate MVP    Echocardiogram 4/15 demonstrated normal LV function no significant atrial enlargement (30/1.66) and normal wall thickness  Date Cr Hgb  1/16 0.67 11.2  7/18  0.92 15.0   Recurrent palps intermittently; no chest pain no edema no DOE  Palps seem to be related to stress   Past Medical History:  Diagnosis Date  . Arthritis   . Benign tumor of pleura 2007  . Bursitis   . Cataracts, bilateral   . Complication of anesthesia   . Cough since 05-09-2014   with sore throat  . Fracture    at l1  . Fracture    right arm x 2  . History of blood transfusion age 11 or 47  . Hyperlipidemia   . Paroxysmal atrial fibrillation (HCC)   . PONV (postoperative nausea and vomiting) 48 yrs ago   with ether  . Sarcoidosis    As a child  . Seasonal allergies     Past Surgical History:  Procedure Laterality Date  . ABDOMINAL HYSTERECTOMY     complete  . APPENDECTOMY    . FRACTURE SURGERY Right 2007    . Left video-assisted thoracoscopy, left thoracotomy with wedge resection of left lower lobe lung mass.  Insertion of On-Q pain pump  12/03/2005   [facility]  . ROTATOR CUFF REPAIR Left 2008  . TONSILLECTOMY  as child  . TOTAL HIP ARTHROPLASTY Right 05/18/2014   Procedure: RIGHT TOTAL HIP ARTHROPLASTY ANTERIOR APPROACH;  Surgeon: [name], MD;  Location: [facility];  Service: Orthopedics;  Laterality: Right;  Marland Kitchen VAGINAL HYSTERECTOMY      Current Outpatient Medications  Medication Sig Dispense Refill  . apixaban (ELIQUIS) 5 MG TABS tablet Take 1 tablet (5 mg total) by mouth 2 (two) times daily. 180 tablet 3  . Calcium Carbonate-Vitamin D (CALCIUM + D PO) Take 1 tablet by mouth daily.    Marland Kitchen docusate sodium 100 MG CAPS Take 100 mg by mouth 2 (two) times daily. 10 capsule 0  . fish oil-omega-3 fatty acids 1000 MG capsule Take 1 g by mouth daily.     . furosemide (LASIX) 20 MG tablet Take 20 mg by mouth daily as needed for fluid or edema.     Marland Kitchen ibuprofen (ADVIL,MOTRIN) 200 MG tablet Take 400 mg by mouth every 6 (six) hours as needed (pain). Pt rarely takes    . loratadine (CLARITIN) 10 MG tablet Take 10 mg by mouth daily as needed for allergies.    . Magnesium 250 MG TABS Take 250 mg  by mouth daily.    . metoprolol succinate (TOPROL-XL) 25 MG 24 hr tablet Take 1 tablet (25 mg total) by mouth daily. 90 tablet 3  . naproxen sodium (ANAPROX) 220 MG tablet Take 220 mg by mouth 2 (two) times daily as needed (pain). Pt rarely takes    . tiZANidine (ZANAFLEX) 4 MG tablet Take 1 tablet (4 mg total) by mouth every 6 (six) hours as needed for muscle spasms. 30 tablet 0   No current facility-administered medications for this visit.     Allergies  Allergen Reactions  . Poison Ivy Extract     Severe hives, needed steroid taper last time    Review of Systems negative except from HPI and PMH  Physical Exam BP 112/60   Pulse 72   Ht 5\' 6"  (1.676 m)   Wt 143 lb (64.9 kg)   SpO2 98%   BMI 23.08 kg/m   Well developed and nourished in no acute distress HENT normal Neck supple with JVP-flat Clear Irregular rate and rhythm, no murmurs or gallops Abd-soft with active BS No Clubbing cyanosis edema Skin-warm and dry A & Oriented  Grossly normal sensory and motor function    ECG today to review arrhythmia demonstrated sinus at 78 20/12/45 with freq PACs but no PVCs and no afib documented  Assessment and  Plan  Mitral valve prolapse  Paroxysmal atrial fibrillation   HFpEF   PVC quiescient  PACs  Continues with PAF of relatively brief duration-- no changes  On Anticoagulation;  No bleeding issues   Euvolemic continue current meds  PACs frequent, and they may be contributing to her symptoms--indeed todays  If symptoms become increasingly frequent could consider Anti-arrhythmic drugs--have reviewed this with pt and the potential for proarrhtyhmia  Will get blood work from Dr Virgina Jock

## 2017-06-12 DIAGNOSIS — H401221 Low-tension glaucoma, left eye, mild stage: Secondary | ICD-10-CM | POA: Diagnosis not present

## 2017-06-12 DIAGNOSIS — H53411 Scotoma involving central area, right eye: Secondary | ICD-10-CM | POA: Diagnosis not present

## 2017-06-12 DIAGNOSIS — H401212 Low-tension glaucoma, right eye, moderate stage: Secondary | ICD-10-CM | POA: Diagnosis not present

## 2017-06-12 DIAGNOSIS — H21561 Pupillary abnormality, right eye: Secondary | ICD-10-CM | POA: Diagnosis not present

## 2017-08-05 ENCOUNTER — Other Ambulatory Visit: Payer: Self-pay | Admitting: Internal Medicine

## 2017-08-05 DIAGNOSIS — D171 Benign lipomatous neoplasm of skin and subcutaneous tissue of trunk: Secondary | ICD-10-CM | POA: Diagnosis not present

## 2017-08-05 DIAGNOSIS — Z85828 Personal history of other malignant neoplasm of skin: Secondary | ICD-10-CM | POA: Diagnosis not present

## 2017-08-05 DIAGNOSIS — L821 Other seborrheic keratosis: Secondary | ICD-10-CM | POA: Diagnosis not present

## 2017-08-05 DIAGNOSIS — Z139 Encounter for screening, unspecified: Secondary | ICD-10-CM

## 2017-08-05 DIAGNOSIS — D2262 Melanocytic nevi of left upper limb, including shoulder: Secondary | ICD-10-CM | POA: Diagnosis not present

## 2017-08-05 DIAGNOSIS — L570 Actinic keratosis: Secondary | ICD-10-CM | POA: Diagnosis not present

## 2017-08-12 DIAGNOSIS — H401212 Low-tension glaucoma, right eye, moderate stage: Secondary | ICD-10-CM | POA: Diagnosis not present

## 2017-08-12 DIAGNOSIS — H401221 Low-tension glaucoma, left eye, mild stage: Secondary | ICD-10-CM | POA: Diagnosis not present

## 2017-08-12 DIAGNOSIS — H21561 Pupillary abnormality, right eye: Secondary | ICD-10-CM | POA: Diagnosis not present

## 2017-08-12 DIAGNOSIS — H53411 Scotoma involving central area, right eye: Secondary | ICD-10-CM | POA: Diagnosis not present

## 2017-09-05 ENCOUNTER — Ambulatory Visit
Admission: RE | Admit: 2017-09-05 | Discharge: 2017-09-05 | Disposition: A | Discharge status: Home / Self Care | Payer: Medicare Other | Source: Ambulatory Visit | Attending: Internal Medicine | Admitting: Internal Medicine

## 2017-09-05 DIAGNOSIS — Z1231 Encounter for screening mammogram for malignant neoplasm of breast: Secondary | ICD-10-CM | POA: Diagnosis not present

## 2017-09-05 DIAGNOSIS — Z139 Encounter for screening, unspecified: Secondary | ICD-10-CM

## 2017-10-10 DIAGNOSIS — H401212 Low-tension glaucoma, right eye, moderate stage: Secondary | ICD-10-CM | POA: Diagnosis not present

## 2017-10-10 DIAGNOSIS — H53411 Scotoma involving central area, right eye: Secondary | ICD-10-CM | POA: Diagnosis not present

## 2017-10-10 DIAGNOSIS — H21561 Pupillary abnormality, right eye: Secondary | ICD-10-CM | POA: Diagnosis not present

## 2017-10-10 DIAGNOSIS — H401221 Low-tension glaucoma, left eye, mild stage: Secondary | ICD-10-CM | POA: Diagnosis not present

## 2017-10-23 ENCOUNTER — Encounter: Payer: Self-pay | Admitting: Internal Medicine

## 2017-10-23 ENCOUNTER — Ambulatory Visit (INDEPENDENT_AMBULATORY_CARE_PROVIDER_SITE_OTHER): Payer: Medicare Other | Admitting: Internal Medicine

## 2017-10-23 VITALS — BP 118/62 | HR 62 | Ht 66.0 in | Wt 148.0 lb

## 2017-10-23 DIAGNOSIS — I5030 Unspecified diastolic (congestive) heart failure: Secondary | ICD-10-CM

## 2017-10-23 DIAGNOSIS — R001 Bradycardia, unspecified: Secondary | ICD-10-CM

## 2017-10-23 DIAGNOSIS — I493 Ventricular premature depolarization: Secondary | ICD-10-CM

## 2017-10-23 DIAGNOSIS — I480 Paroxysmal atrial fibrillation: Secondary | ICD-10-CM

## 2017-10-23 DIAGNOSIS — I341 Nonrheumatic mitral (valve) prolapse: Secondary | ICD-10-CM

## 2017-10-23 MED ORDER — APIXABAN 5 MG PO TABS: 5.0000 mg | ORAL_TABLET | Freq: Two times a day (BID) | ORAL | 3 refills | Status: DC

## 2017-10-23 NOTE — Patient Instructions (Signed)

## 2017-10-23 NOTE — Progress Notes (Signed)
Patient Care Team: Shon Baton, MD as PCP - General (Internal Medicine) Gaye Pollack, MD (Cardiothoracic Surgery) Shon Baton, MD as Consulting Physician (Internal Medicine)   HPI  [name] is a 82 y.o. female Seen in followup for chief complaint of palpitations  associated w dizziness and fatigue History of chest pain but none recently  Stress echo demonstrated no echocardiographic changes; images were poor.   Event recorder>>atrial fibrillation. She is managed with apixoban  Without significant bleeding   Atenolol resulted in palpitations being much improved   A some point she got switched to metoprolol. Subsequently  bradycardia. It  was thought to be partly related to PVCs and perhaps the bradycardia was aggravating the PVCs. Her beta blockers were decreased.    She has infrequent atrial fibrillation   DATE TEST EF   4/13 Echo   55-65 %   4/15 Echo  55-65%            Date Cr Hgb  1/16 0.67 11.2  7/18 0.92 15.0  10/18  15.4            Past Medical History:  Diagnosis Date  . Arthritis   . Benign tumor of pleura 2007  . Bursitis   . Cataracts, bilateral   . Complication of anesthesia   . Cough since 05-09-2014   with sore throat  . Fracture    at l1  . Fracture    right arm x 2  . History of blood transfusion age 89 or 74  . Hyperlipidemia   . Paroxysmal atrial fibrillation (HCC)   . PONV (postoperative nausea and vomiting) 48 yrs ago   with ether  . Sarcoidosis    As a child  . Seasonal allergies     Past Surgical History:  Procedure Laterality Date  . ABDOMINAL HYSTERECTOMY     complete  . APPENDECTOMY    . FRACTURE SURGERY Right 2007  . Left video-assisted thoracoscopy, left thoracotomy with wedge resection of left lower lobe lung mass.  Insertion of On-Q pain pump  12/03/2005   [facility]  . ROTATOR CUFF REPAIR Left 2008  . TONSILLECTOMY  as child  . TOTAL HIP ARTHROPLASTY Right 05/18/2014   Procedure: RIGHT TOTAL HIP ARTHROPLASTY ANTERIOR  APPROACH;  Surgeon: [name], MD;  Location: [facility];  Service: Orthopedics;  Laterality: Right;  Marland Kitchen VAGINAL HYSTERECTOMY      Current Outpatient Medications  Medication Sig Dispense Refill  . apixaban (ELIQUIS) 5 MG TABS tablet Take 1 tablet (5 mg total) by mouth 2 (two) times daily. 180 tablet 3  . Calcium Carbonate-Vitamin D (CALCIUM + D PO) Take 1 tablet by mouth daily.    Marland Kitchen docusate sodium 100 MG CAPS Take 100 mg by mouth 2 (two) times daily. 10 capsule 0  . fish oil-omega-3 fatty acids 1000 MG capsule Take 1 g by mouth daily.     . furosemide (LASIX) 20 MG tablet Take 20 mg by mouth daily as needed for fluid or edema.     Marland Kitchen ibuprofen (ADVIL,MOTRIN) 200 MG tablet Take 400 mg by mouth every 6 (six) hours as needed (pain). Pt rarely takes    . loratadine (CLARITIN) 10 MG tablet Take 10 mg by mouth daily as needed for allergies.    . Magnesium 250 MG TABS Take 250 mg by mouth daily.    . metoprolol succinate (TOPROL-XL) 25 MG 24 hr tablet Take 1 tablet (25 mg total) by mouth daily.  90 tablet 3  . naproxen sodium (ANAPROX) 220 MG tablet Take 220 mg by mouth 2 (two) times daily as needed (pain). Pt rarely takes    . tiZANidine (ZANAFLEX) 4 MG tablet Take 1 tablet (4 mg total) by mouth every 6 (six) hours as needed for muscle spasms. 30 tablet 0   No current facility-administered medications for this visit.     Allergies  Allergen Reactions  . Poison Ivy Extract     Severe hives, needed steroid taper last time    Review of Systems negative except from HPI and PMH  Physical Exam BP 118/62   Pulse 62   Ht 5\' 6"  (1.676 m)   Wt 148 lb (67.1 kg)   SpO2 95%   BMI 23.89 kg/m  Well developed and nourished in no acute distress HENT normal Neck supple with JVP-flat Clear Regular rate and rhythm, no murmurs or gallops Abd-soft with active BS No Clubbing cyanosis edema Skin-warm and dry A & Oriented  Grossly normal sensory and motor function  ECG  Sinus @  62' 21/13/44 LAD LBBB  Assessment and  Plan  Mitral valve prolapse  Paroxysmal atrial fibrillation   HFpEF   PVC quiescient  PACs  No significant atrial fibrillation  On Anticoagulation;  No bleeding issues   Euvolemic continue current meds  Labs obtained from Dr Virgina Jock

## 2017-12-13 ENCOUNTER — Other Ambulatory Visit: Payer: Self-pay | Admitting: Internal Medicine

## 2017-12-19 DIAGNOSIS — H35361 Drusen (degenerative) of macula, right eye: Secondary | ICD-10-CM | POA: Diagnosis not present

## 2017-12-19 DIAGNOSIS — H401212 Low-tension glaucoma, right eye, moderate stage: Secondary | ICD-10-CM | POA: Diagnosis not present

## 2017-12-19 DIAGNOSIS — D3132 Benign neoplasm of left choroid: Secondary | ICD-10-CM | POA: Diagnosis not present

## 2017-12-19 DIAGNOSIS — H401221 Low-tension glaucoma, left eye, mild stage: Secondary | ICD-10-CM | POA: Diagnosis not present

## 2018-02-10 ENCOUNTER — Encounter: Payer: Self-pay | Admitting: Internal Medicine

## 2018-02-10 DIAGNOSIS — R82998 Other abnormal findings in urine: Secondary | ICD-10-CM | POA: Diagnosis not present

## 2018-02-10 DIAGNOSIS — E7849 Other hyperlipidemia: Secondary | ICD-10-CM | POA: Diagnosis not present

## 2018-02-10 DIAGNOSIS — M859 Disorder of bone density and structure, unspecified: Secondary | ICD-10-CM | POA: Diagnosis not present

## 2018-02-17 DIAGNOSIS — D869 Sarcoidosis, unspecified: Secondary | ICD-10-CM | POA: Diagnosis not present

## 2018-02-17 DIAGNOSIS — I341 Nonrheumatic mitral (valve) prolapse: Secondary | ICD-10-CM | POA: Diagnosis not present

## 2018-02-17 DIAGNOSIS — I480 Paroxysmal atrial fibrillation: Secondary | ICD-10-CM | POA: Diagnosis not present

## 2018-02-17 DIAGNOSIS — Z1212 Encounter for screening for malignant neoplasm of rectum: Secondary | ICD-10-CM | POA: Diagnosis not present

## 2018-02-17 DIAGNOSIS — Z23 Encounter for immunization: Secondary | ICD-10-CM | POA: Diagnosis not present

## 2018-02-17 DIAGNOSIS — Z1389 Encounter for screening for other disorder: Secondary | ICD-10-CM | POA: Diagnosis not present

## 2018-02-17 DIAGNOSIS — E7849 Other hyperlipidemia: Secondary | ICD-10-CM | POA: Diagnosis not present

## 2018-02-17 DIAGNOSIS — G4762 Sleep related leg cramps: Secondary | ICD-10-CM | POA: Diagnosis not present

## 2018-02-17 DIAGNOSIS — I471 Supraventricular tachycardia: Secondary | ICD-10-CM | POA: Diagnosis not present

## 2018-02-17 DIAGNOSIS — L659 Nonscarring hair loss, unspecified: Secondary | ICD-10-CM | POA: Diagnosis not present

## 2018-02-17 DIAGNOSIS — H6121 Impacted cerumen, right ear: Secondary | ICD-10-CM | POA: Diagnosis not present

## 2018-02-17 DIAGNOSIS — Q2546 Tortuous aortic arch: Secondary | ICD-10-CM | POA: Diagnosis not present

## 2018-02-17 DIAGNOSIS — Z0000 Encounter for general adult medical examination without abnormal findings: Secondary | ICD-10-CM | POA: Diagnosis not present

## 2018-02-17 DIAGNOSIS — I5189 Other ill-defined heart diseases: Secondary | ICD-10-CM | POA: Diagnosis not present

## 2018-04-01 ENCOUNTER — Other Ambulatory Visit: Payer: Self-pay | Admitting: Surgery

## 2018-04-01 DIAGNOSIS — I341 Nonrheumatic mitral (valve) prolapse: Secondary | ICD-10-CM

## 2018-04-02 ENCOUNTER — Encounter: Payer: Self-pay | Admitting: Surgery

## 2018-04-02 ENCOUNTER — Other Ambulatory Visit: Payer: Self-pay

## 2018-04-02 ENCOUNTER — Ambulatory Visit (INDEPENDENT_AMBULATORY_CARE_PROVIDER_SITE_OTHER): Payer: Medicare Other | Admitting: Surgery

## 2018-04-02 ENCOUNTER — Ambulatory Visit
Admission: RE | Admit: 2018-04-02 | Discharge: 2018-04-02 | Disposition: A | Discharge status: Home / Self Care | Payer: Medicare Other | Source: Ambulatory Visit | Attending: Surgery | Admitting: Surgery

## 2018-04-02 VITALS — BP 118/62 | HR 80 | Resp 16 | Ht 66.0 in | Wt 142.0 lb

## 2018-04-02 DIAGNOSIS — I341 Nonrheumatic mitral (valve) prolapse: Secondary | ICD-10-CM

## 2018-04-02 DIAGNOSIS — Z951 Presence of aortocoronary bypass graft: Secondary | ICD-10-CM

## 2018-04-02 DIAGNOSIS — Z8709 Personal history of other diseases of the respiratory system: Secondary | ICD-10-CM

## 2018-04-02 DIAGNOSIS — Z5332 Thoracoscopic surgical procedure converted to open procedure: Secondary | ICD-10-CM | POA: Diagnosis not present

## 2018-04-02 DIAGNOSIS — Z86018 Personal history of other benign neoplasm: Secondary | ICD-10-CM

## 2018-04-02 NOTE — Progress Notes (Signed)
     HPI:  The patient returns to my office today for followup status post left thoracotomy and wedge resection of a left lower lobe lung mass on 12/03/2005. 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 Since I saw her about 1 year ago she has continued to do well. She denies any chest pain. She's had no shortness of breath.  She has a history of paroxysmal atrial fibrillation and is on Eliquis.  Current Outpatient Medications  Medication Sig Dispense Refill  . apixaban (ELIQUIS) 5 MG TABS tablet Take 1 tablet (5 mg total) by mouth 2 (two) times daily. 180 tablet 3  . Calcium Carbonate-Vitamin D (CALCIUM + D PO) Take 1 tablet by mouth daily.    Marland Kitchen docusate sodium 100 MG CAPS Take 100 mg by mouth 2 (two) times daily. 10 capsule 0  . fish oil-omega-3 fatty acids 1000 MG capsule Take 1 g by mouth daily.     . furosemide (LASIX) 20 MG tablet Take 20 mg by mouth daily as needed for fluid or edema.     Marland Kitchen ibuprofen (ADVIL,MOTRIN) 200 MG tablet Take 400 mg by mouth every 6 (six) hours as needed (pain). Pt rarely takes    . loratadine (CLARITIN) 10 MG tablet Take 10 mg by mouth daily as needed for allergies.    . Magnesium 250 MG TABS Take 250 mg by mouth daily.    . metoprolol succinate (TOPROL-XL) 25 MG 24 hr tablet TAKE ONE TABLET EACH DAY 90 tablet 2  . tiZANidine (ZANAFLEX) 4 MG tablet Take 1 tablet (4 mg total) by mouth every 6 (six) hours as needed for muscle spasms. 30 tablet 0   No current facility-administered medications for this visit.      Physical Exam: BP 118/62 (BP Location: Left Arm, Patient Position: Sitting, Cuff Size: Normal)   Pulse 80   Resp 16   Ht 5\' 6"  (1.676 m)   Wt 142 lb (64.4 kg)   SpO2 96% Comment: RA  BMI 22.92 kg/m  She looks well. There is no cervical or supraclavicular adenopathy. Lungs are clear. Cardiac exam shows a regular rate and rhythm with normal heart sounds.  Diagnostic Tests:  CLINICAL DATA:  History of left VATS, follow-up  examination  EXAM: CHEST - 2 VIEW  COMPARISON:  04/03/2017  FINDINGS: Cardiac shadow is within normal limits. Lungs are well aerated bilaterally infiltrate or sizable effusion is seen. No acute bony abnormality is noted.  IMPRESSION: No acute abnormality noted.   Electronically Signed   By: Inez Catalina M.D.   On: 04/02/2018 09:49   Impression:  She continues to do well following resection of a solitary fibrous tumor of the pleura in August of 2007.   Plan:   I will see her back in one year with a repeat chest x-ray.    I spent 10 minutes performing this established patient evaluation and > 50% of this time was spent face to face counseling and coordinating the surveillance of this patient's solitary fibrous tumor of the pleura.  Gaye Pollack, MD [facility] [phone]

## 2018-05-08 ENCOUNTER — Ambulatory Visit (INDEPENDENT_AMBULATORY_CARE_PROVIDER_SITE_OTHER): Payer: Medicare Other | Admitting: Internal Medicine

## 2018-05-08 ENCOUNTER — Encounter: Payer: Self-pay | Admitting: Internal Medicine

## 2018-05-08 VITALS — BP 120/72 | HR 62 | Ht 66.0 in | Wt 148.4 lb

## 2018-05-08 DIAGNOSIS — I5030 Unspecified diastolic (congestive) heart failure: Secondary | ICD-10-CM

## 2018-05-08 DIAGNOSIS — S0990XA Unspecified injury of head, initial encounter: Secondary | ICD-10-CM | POA: Diagnosis not present

## 2018-05-08 DIAGNOSIS — I480 Paroxysmal atrial fibrillation: Secondary | ICD-10-CM

## 2018-05-08 DIAGNOSIS — I493 Ventricular premature depolarization: Secondary | ICD-10-CM

## 2018-05-08 NOTE — Progress Notes (Signed)
Patient Care Team: Shon Baton, MD as PCP - General (Internal Medicine) Gaye Pollack, MD (Cardiothoracic Surgery) Shon Baton, MD as Consulting Physician (Internal Medicine)   HPI  [name] is a 83 y.o. female Seen in followup for chief complaint of palpitations  associated w dizziness and fatigue History of chest pain but none recently  Stress echo demonstrated no echocardiographic changes; images were poor.   Event recorder>>atrial fibrillation. She is managed with apixoban and has had no significant bleeding.  Has had some palpitations.  Episode of lightheadedness with standing.  Reminiscent of prior episodes of orthostatic intolerance.  Other major complaint is alopecia which is profound and fatigue which is gradually progressive     DATE TEST EF   4/13 Echo   55-65 %   4/15 Echo  55-65%            Date Cr K Hgb  1/16 0.67  11.2  7/18 0.92  15.0  10/18   15.4   10/19 0.9 4.9 15.1       Past Medical History:  Diagnosis Date  . Arthritis   . Benign tumor of pleura 2007  . Bursitis   . Cataracts, bilateral   . Complication of anesthesia   . Cough since 05-09-2014   with sore throat  . Fracture    at l1  . Fracture    right arm x 2  . History of blood transfusion age 83 or 75  . Hyperlipidemia   . Paroxysmal atrial fibrillation (HCC)   . PONV (postoperative nausea and vomiting) 48 yrs ago   with ether  . Sarcoidosis    As a child  . Seasonal allergies     Past Surgical History:  Procedure Laterality Date  . ABDOMINAL HYSTERECTOMY     complete  . APPENDECTOMY    . FRACTURE SURGERY Right 2007  . Left video-assisted thoracoscopy, left thoracotomy with wedge resection of left lower lobe lung mass.  Insertion of On-Q pain pump  12/03/2005   [facility]  . ROTATOR CUFF REPAIR Left 2008  . TONSILLECTOMY  as child  . TOTAL HIP ARTHROPLASTY Right 05/18/2014   Procedure: RIGHT TOTAL HIP ARTHROPLASTY ANTERIOR APPROACH;  Surgeon: [name], MD;   Location: [facility];  Service: Orthopedics;  Laterality: Right;  Marland Kitchen VAGINAL HYSTERECTOMY      Current Outpatient Medications  Medication Sig Dispense Refill  . apixaban (ELIQUIS) 5 MG TABS tablet Take 1 tablet (5 mg total) by mouth 2 (two) times daily. 180 tablet 3  . Calcium Carbonate-Vitamin D (CALCIUM + D PO) Take 1 tablet by mouth daily.    Marland Kitchen docusate sodium 100 MG CAPS Take 100 mg by mouth 2 (two) times daily. 10 capsule 0  . fish oil-omega-3 fatty acids 1000 MG capsule Take 1 g by mouth daily.     . furosemide (LASIX) 20 MG tablet Take 20 mg by mouth daily as needed for fluid or edema.     Marland Kitchen ibuprofen (ADVIL,MOTRIN) 200 MG tablet Take 400 mg by mouth every 6 (six) hours as needed (pain). Pt rarely takes    . loratadine (CLARITIN) 10 MG tablet Take 10 mg by mouth daily as needed for allergies.    . Magnesium 250 MG TABS Take 250 mg by mouth daily.    . metoprolol succinate (TOPROL-XL) 25 MG 24 hr tablet TAKE ONE TABLET EACH DAY 90 tablet 2  . tiZANidine (ZANAFLEX) 4 MG tablet Take 1 tablet (4  mg total) by mouth every 6 (six) hours as needed for muscle spasms. 30 tablet 0   No current facility-administered medications for this visit.     Allergies  Allergen Reactions  . Poison Ivy Extract     Severe hives, needed steroid taper last time    Review of Systems negative except from HPI and PMH  Physical Exam BP 120/72   Pulse 62   Ht 5\' 6"  (1.676 m)   Wt 148 lb 6.4 oz (67.3 kg)   SpO2 99%   BMI 23.95 kg/m  Well developed and nourished in no acute distress HENT normal Neck supple with JVP-flat Clear Regular rate and rhythm, no murmurs or gallops Abd-soft with active BS No Clubbing cyanosis edema Skin-warm and dry A & Oriented  Grossly normal sensory and motor function  ECG sinus rhythm at 62 Interval 23/12/46 Axis 270  Assessment and  Plan  Mitral valve prolapse  Paroxysmal atrial fibrillation   HFpEF   PVC quiescient  PACs  Alopecia  Infrequent atrial  fibrillation.  On Anticoagulation;  No bleeding issues   Alopecia may be related to her beta-blocker.  We will discontinue it.  Episode of lightheadedness likely was orthostatic by description.  Discontinuing her beta-blocker may help this as well as her baseline blood pressure is quite low.  We spent more than 50% of our >25 min visit in face to face counseling regarding the above

## 2018-05-08 NOTE — Patient Instructions (Addendum)
Medication Instructions:  Your physician has recommended you make the following change in your medication:   1. Stop Metoprolol  Labwork: None ordered.  Testing/Procedures:  Dr Caryl Comes would like you to have a Head CT performed tomorrow for you recent fall.  Follow-Up: Your physician recommends that you schedule a follow-up appointment in:   6 months with Dr Caryl Comes  Any Other Special Instructions Will Be Listed Below (If Applicable).     If you need a refill on your cardiac medications before your next appointment, please call your pharmacy.

## 2018-05-09 ENCOUNTER — Ambulatory Visit (INDEPENDENT_AMBULATORY_CARE_PROVIDER_SITE_OTHER)
Admission: RE | Admit: 2018-05-09 | Discharge: 2018-05-09 | Disposition: A | Discharge status: Home / Self Care | Payer: Medicare Other | Source: Ambulatory Visit | Attending: Internal Medicine | Admitting: Internal Medicine

## 2018-05-09 DIAGNOSIS — S0990XA Unspecified injury of head, initial encounter: Secondary | ICD-10-CM | POA: Diagnosis not present

## 2018-06-16 DIAGNOSIS — H21561 Pupillary abnormality, right eye: Secondary | ICD-10-CM | POA: Diagnosis not present

## 2018-06-16 DIAGNOSIS — H53411 Scotoma involving central area, right eye: Secondary | ICD-10-CM | POA: Diagnosis not present

## 2018-06-16 DIAGNOSIS — H401221 Low-tension glaucoma, left eye, mild stage: Secondary | ICD-10-CM | POA: Diagnosis not present

## 2018-06-16 DIAGNOSIS — H401212 Low-tension glaucoma, right eye, moderate stage: Secondary | ICD-10-CM | POA: Diagnosis not present

## 2018-07-21 DIAGNOSIS — M8589 Other specified disorders of bone density and structure, multiple sites: Secondary | ICD-10-CM | POA: Diagnosis not present

## 2018-07-21 DIAGNOSIS — M859 Disorder of bone density and structure, unspecified: Secondary | ICD-10-CM | POA: Diagnosis not present

## 2018-08-01 ENCOUNTER — Other Ambulatory Visit: Payer: Self-pay | Admitting: Internal Medicine

## 2018-08-01 NOTE — Telephone Encounter (Signed)
Eliquis 5mg  refill request received; pt is 83 yrs old, wt-67.3kg, Crea-0.90 on 02/10/2018, last seen by Dr. Caryl Comes on 05/08/2018; refill sent.

## 2018-09-03 ENCOUNTER — Telehealth: Payer: Self-pay

## 2018-09-03 NOTE — Telephone Encounter (Signed)

## 2018-09-08 ENCOUNTER — Encounter: Payer: Self-pay | Admitting: Internal Medicine

## 2018-09-08 ENCOUNTER — Telehealth (INDEPENDENT_AMBULATORY_CARE_PROVIDER_SITE_OTHER): Payer: Medicare Other | Admitting: Internal Medicine

## 2018-09-08 VITALS — HR 79 | Ht 66.0 in | Wt 143.0 lb

## 2018-09-08 DIAGNOSIS — I480 Paroxysmal atrial fibrillation: Secondary | ICD-10-CM

## 2018-09-08 DIAGNOSIS — I5032 Chronic diastolic (congestive) heart failure: Secondary | ICD-10-CM

## 2018-09-08 NOTE — Progress Notes (Signed)
Electrophysiology TeleHealth Note   Due to national recommendations of social distancing due to COVID 19, an audio/video telehealth visit is felt to be most appropriate for this patient at this time.  See MyChart message from today for the patient's consent to telehealth for [facility].   Date:  09/08/2018   ID:  [name], DOB [birth year], MRN [id number]  Location: patient's home  Provider location: [address], [place]  Evaluation Performed: Follow-up visit  PCP:  Shon Baton, MD  Cardiologist:    Electrophysiologist:  SK   Chief Complaint:   Palpitations   History of Present Illness:    [name] is a 83 y.o. female who presents via audio/video conferencing for a telehealth visit today.  Since last being seen in our clinic for palpitations with event recording demonstrating atrial fibrillation and alopecia for which we discontinue beta-blockers the patient reports no interval palpitations  No bleeding  Alopecia is maybe a touch better  The patient denies chest pain, shortness of breath, nocturnal dyspnea, orthopnea or peripheral edema.  There have been no palpitations, lightheadedness or syncope.    DATE TEST EF   4/13 Echo   55-65 %   4/15 Echo  55-65%            Date Cr K Hgb  1/16 0.67  11.2  7/18 0.92  15.0  10/18   15.4   10/19 0.9 4.9 15.1           The patient denies symptoms of fevers, chills, cough, or new SOB worrisome for COVID 19.    Past Medical History:  Diagnosis Date  . Arthritis   . Benign tumor of pleura 2007  . Bursitis   . Cataracts, bilateral   . Complication of anesthesia   . Cough since 05-09-2014   with sore throat  . Fracture    at l1  . Fracture    right arm x 2  . History of blood transfusion age 32 or 61  . Hyperlipidemia   . Paroxysmal atrial fibrillation (HCC)   . PONV (postoperative nausea and vomiting) 48 yrs ago   with ether  . Sarcoidosis    As a child  . Seasonal allergies      Past Surgical History:  Procedure Laterality Date  . ABDOMINAL HYSTERECTOMY     complete  . APPENDECTOMY    . FRACTURE SURGERY Right 2007  . Left video-assisted thoracoscopy, left thoracotomy with wedge resection of left lower lobe lung mass.  Insertion of On-Q pain pump  12/03/2005   [place]  . ROTATOR CUFF REPAIR Left 2008  . TONSILLECTOMY  as child  . TOTAL HIP ARTHROPLASTY Right 05/18/2014   Procedure: RIGHT TOTAL HIP ARTHROPLASTY ANTERIOR APPROACH;  Surgeon: [name], MD;  Location: [facility];  Service: Orthopedics;  Laterality: Right;  Marland Kitchen VAGINAL HYSTERECTOMY      Current Outpatient Medications  Medication Sig Dispense Refill  . Biotin 10000 MCG TABS Take 1 tablet by mouth daily.    Marland Kitchen docusate sodium 100 MG CAPS Take 100 mg by mouth 2 (two) times daily. 10 capsule 0  . ELIQUIS 5 MG TABS tablet TAKE ONE TABLET TWICE DAILY 180 tablet 2  . fish oil-omega-3 fatty acids 1000 MG capsule Take 1 g by mouth daily.     . furosemide (LASIX) 20 MG tablet Take 20 mg by mouth daily as needed for fluid or edema.     . Glucosamine-Chondroit-Vit C-Mn (  GLUCOSAMINE 1500 COMPLEX PO) Take 1 capsule by mouth 2 (two) times a day.    . ibuprofen (ADVIL,MOTRIN) 200 MG tablet Take 400 mg by mouth every 6 (six) hours as needed (pain). Pt rarely takes    . loratadine (CLARITIN) 10 MG tablet Take 10 mg by mouth daily as needed for allergies.    . Magnesium 250 MG TABS Take 250 mg by mouth daily.    Marland Kitchen tiZANidine (ZANAFLEX) 4 MG tablet Take 1 tablet (4 mg total) by mouth every 6 (six) hours as needed for muscle spasms. 30 tablet 0  . vitamin B-12 (CYANOCOBALAMIN) 1000 MCG tablet Take 1,000 mcg by mouth daily.     No current facility-administered medications for this visit.     Allergies:   Poison ivy extract   Social History:  The patient  reports that she has never smoked. She has never used smokeless tobacco. She reports current alcohol use. She reports that she does not use drugs.   Family History:   The patient's   family history includes Cancer in her brother and maternal grandfather; Heart disease in her brother and father.   ROS:  Please see the history of present illness.   All other systems are personally reviewed and negative.    Exam:    Vital Signs:  Pulse 79   Ht 5\' 6"  (1.676 m)   Wt 143 lb (64.9 kg)   SpO2 98%   BMI 23.08 kg/m     Well appearing, alert and conversant, regular work of breathing,  good skin color Eyes- anicteric, neuro- grossly intact, skin- no apparent rash or lesions or cyanosis, mouth- oral mucosa is pink   Labs/Other Tests and Data Reviewed:    Recent Labs: No results found for requested labs within last 8760 hours.   Wt Readings from Last 3 Encounters:  09/08/18 143 lb (64.9 kg)  05/08/18 148 lb 6.4 oz (67.3 kg)  04/02/18 142 lb (64.4 kg)     Other studies personally reviewed: Additional studies/ records that were reviewed today include:  none    ASSESSMENT & PLAN:    Paroxysmal atrial fibrillation   HFpEF   PVC/PACs  quiescient  Alopecia  Orthostatic LH  On Anticoagulation;  No bleeding issues   Euvolemic continue current meds  No intercurrent atrial fibrillation or flutter of which she is aware  Alopecia largely unchanged  Suggested she followup with her PCP    COVID 19 screen The patient denies symptoms of COVID 19 at this time.  The importance of social distancing was discussed today.  Follow-up:  *59m    Current medicines are reviewed at length with the patient today.   The patient does not have concerns regarding her medicines.  The following changes were made today:  none  Labs/ tests ordered today include:   No orders of the defined types were placed in this encounter.   Future tests ( post COVID )     Patient Risk:  after full review of this patients clinical status, I feel that they are at moderate  risk at this time.  Today, I have spent 13  minutes with the patient with telehealth technology  discussing the above.  Signed, Virl Axe, MD  09/08/2018 9:36 AM     [facility] [address] [place] [address] [phone] (office) [phone] (fax)    .hc

## 2018-09-15 DIAGNOSIS — L814 Other melanin hyperpigmentation: Secondary | ICD-10-CM | POA: Diagnosis not present

## 2018-09-15 DIAGNOSIS — L821 Other seborrheic keratosis: Secondary | ICD-10-CM | POA: Diagnosis not present

## 2018-09-15 DIAGNOSIS — Z85828 Personal history of other malignant neoplasm of skin: Secondary | ICD-10-CM | POA: Diagnosis not present

## 2019-01-10 DIAGNOSIS — Z23 Encounter for immunization: Secondary | ICD-10-CM | POA: Diagnosis not present

## 2019-01-21 DIAGNOSIS — H401212 Low-tension glaucoma, right eye, moderate stage: Secondary | ICD-10-CM | POA: Diagnosis not present

## 2019-01-21 DIAGNOSIS — H35361 Drusen (degenerative) of macula, right eye: Secondary | ICD-10-CM | POA: Diagnosis not present

## 2019-01-21 DIAGNOSIS — H401221 Low-tension glaucoma, left eye, mild stage: Secondary | ICD-10-CM | POA: Diagnosis not present

## 2019-01-21 DIAGNOSIS — Z961 Presence of intraocular lens: Secondary | ICD-10-CM | POA: Diagnosis not present

## 2019-02-16 DIAGNOSIS — E7849 Other hyperlipidemia: Secondary | ICD-10-CM | POA: Diagnosis not present

## 2019-02-16 DIAGNOSIS — E559 Vitamin D deficiency, unspecified: Secondary | ICD-10-CM | POA: Diagnosis not present

## 2019-02-18 DIAGNOSIS — R82998 Other abnormal findings in urine: Secondary | ICD-10-CM | POA: Diagnosis not present

## 2019-02-19 DIAGNOSIS — Z1212 Encounter for screening for malignant neoplasm of rectum: Secondary | ICD-10-CM | POA: Diagnosis not present

## 2019-02-19 LAB — IFOBT (OCCULT BLOOD): IFOBT: POSITIVE

## 2019-02-23 DIAGNOSIS — H04129 Dry eye syndrome of unspecified lacrimal gland: Secondary | ICD-10-CM | POA: Diagnosis not present

## 2019-02-23 DIAGNOSIS — M8580 Other specified disorders of bone density and structure, unspecified site: Secondary | ICD-10-CM | POA: Diagnosis not present

## 2019-02-23 DIAGNOSIS — R195 Other fecal abnormalities: Secondary | ICD-10-CM | POA: Diagnosis not present

## 2019-02-23 DIAGNOSIS — Z0000 Encounter for general adult medical examination without abnormal findings: Secondary | ICD-10-CM | POA: Diagnosis not present

## 2019-02-23 DIAGNOSIS — G4762 Sleep related leg cramps: Secondary | ICD-10-CM | POA: Diagnosis not present

## 2019-02-23 DIAGNOSIS — Z9189 Other specified personal risk factors, not elsewhere classified: Secondary | ICD-10-CM | POA: Diagnosis not present

## 2019-02-23 DIAGNOSIS — Q2546 Tortuous aortic arch: Secondary | ICD-10-CM | POA: Diagnosis not present

## 2019-02-23 DIAGNOSIS — D692 Other nonthrombocytopenic purpura: Secondary | ICD-10-CM | POA: Diagnosis not present

## 2019-02-23 DIAGNOSIS — H409 Unspecified glaucoma: Secondary | ICD-10-CM | POA: Diagnosis not present

## 2019-02-23 DIAGNOSIS — H9190 Unspecified hearing loss, unspecified ear: Secondary | ICD-10-CM | POA: Diagnosis not present

## 2019-02-23 DIAGNOSIS — L659 Nonscarring hair loss, unspecified: Secondary | ICD-10-CM | POA: Diagnosis not present

## 2019-02-23 DIAGNOSIS — I5189 Other ill-defined heart diseases: Secondary | ICD-10-CM | POA: Diagnosis not present

## 2019-03-02 DIAGNOSIS — K921 Melena: Secondary | ICD-10-CM | POA: Diagnosis not present

## 2019-03-05 ENCOUNTER — Encounter: Payer: Self-pay | Admitting: Nurse Practitioner
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
## 2019-03-16 ENCOUNTER — Telehealth: Payer: Self-pay

## 2019-03-16 ENCOUNTER — Other Ambulatory Visit: Payer: Self-pay

## 2019-03-16 ENCOUNTER — Ambulatory Visit (INDEPENDENT_AMBULATORY_CARE_PROVIDER_SITE_OTHER): Payer: Medicare Other | Admitting: Nurse Practitioner

## 2019-03-16 ENCOUNTER — Encounter: Payer: Self-pay | Admitting: Nurse Practitioner

## 2019-03-16 VITALS — BP 124/72 | HR 81 | Temp 97.2°F | Ht 67.0 in | Wt 154.5 lb

## 2019-03-16 DIAGNOSIS — R195 Other fecal abnormalities: Secondary | ICD-10-CM | POA: Diagnosis not present

## 2019-03-16 DIAGNOSIS — Z800 Family history of malignant neoplasm of digestive organs: Secondary | ICD-10-CM

## 2019-03-16 DIAGNOSIS — Z7901 Long term (current) use of anticoagulants: Secondary | ICD-10-CM

## 2019-03-16 DIAGNOSIS — K589 Irritable bowel syndrome without diarrhea: Secondary | ICD-10-CM

## 2019-03-16 DIAGNOSIS — I4891 Unspecified atrial fibrillation: Secondary | ICD-10-CM | POA: Diagnosis not present

## 2019-03-16 MED ORDER — NA SULFATE-K SULFATE-MG SULF 17.5-3.13-1.6 GM/177ML PO SOLN: 1.0000 | Freq: Once | ORAL | 0 refills | Status: AC

## 2019-03-16 NOTE — Telephone Encounter (Signed)
Patient has been notified and aware of instructions. She states clear understanding.

## 2019-03-16 NOTE — Patient Instructions (Addendum)
If you are age 81 or older, your body mass index should be between 23-30. Your Body mass index is 24.2 kg/m. If this is out of the aforementioned range listed, please consider follow up with your Primary Care Provider.  If you are age 59 or younger, your body mass index should be between 19-25. Your Body mass index is 24.2 kg/m. If this is out of the aformentioned range listed, please consider follow up with your Primary Care Provider.   You have been scheduled for a colonoscopy. Please follow written instructions given to you at your visit today.  Please pick up your prep supplies at the pharmacy within the next 1-3 days. If you use inhalers (even only as needed), please bring them with you on the day of your procedure. Your physician has requested that you go to www.startemmi.com and enter the access code given to you at your visit today. This web site gives a general overview about your procedure. However, you should still follow specific instructions given to you by our office regarding your preparation for the procedure.  You will be contacted by our office prior to your procedure for directions on holding your ELIQUIS.  If you do not hear from our office 1 week prior to your scheduled procedure, please call [phone] to discuss.   It was a pleasure to see you today!

## 2019-03-16 NOTE — Telephone Encounter (Signed)
Pt takes Eliquis for afib with CHADS2VASc score of (age x2, sex, CHF). CrCl 78mL/min. Do not need 3 day hold for colonoscopy, ok to hold 1-2 days prior.

## 2019-03-16 NOTE — Telephone Encounter (Signed)
[facility] Pre-operative Risk Assessment     Request for surgical clearance:     Endoscopy Procedure  What type of surgery is being performed?     Colonoscopy   When is this surgery scheduled?     04-09-2019  What type of clearance is required ?   Pharmacy  Are there any medications that need to be held prior to surgery and how long? Eliquis, 3 days  Practice name and name of physician performing surgery?      [facility]  What is your office phone and fax number?      Phone- [phone]  [phone]  Anesthesia type (None, local, MAC, general) ?       MAC

## 2019-03-16 NOTE — Telephone Encounter (Signed)
Can you please comment on how long patient needs to hold Eliquis for prior to upcoming colonoscopy?   Thank you!

## 2019-03-16 NOTE — Telephone Encounter (Signed)
   Primary Cardiologist: Virl Axe, MD  Chart reviewed as part of pre-operative protocol coverage. Given past medical history and time since last visit, based on ACC/AHA guidelines, [name] would be at acceptable risk for the planned procedure without further cardiovascular testing.   Per pharmacy, pt takes Eliquis for afib with CHADS2VASc score of (age x2, sex, CHF). CrCl 62mL/min. Do not need 3 day hold for colonoscopy, ok to hold 1-2 days prior.   I will route this recommendation to the requesting party via Epic fax function and remove from pre-op pool.  Please call with questions.  Kathyrn Drown, NP 03/16/2019, 2:21 PM

## 2019-03-18 NOTE — Progress Notes (Signed)
Addendum: Reviewed and agree with assessment and management plan. Reiley Bertagnolli M, MD  

## 2019-03-25 ENCOUNTER — Other Ambulatory Visit: Payer: Self-pay | Admitting: Internal Medicine

## 2019-03-25 NOTE — Telephone Encounter (Signed)
Age 83, weight 70kg, SCr 0.8 on 02/16/19 at [facility] May 2020, afib indication.

## 2019-03-31 ENCOUNTER — Other Ambulatory Visit: Payer: Self-pay | Admitting: Surgery

## 2019-03-31 DIAGNOSIS — D190 Benign neoplasm of mesothelial tissue of pleura: Secondary | ICD-10-CM

## 2019-04-01 ENCOUNTER — Ambulatory Visit (INDEPENDENT_AMBULATORY_CARE_PROVIDER_SITE_OTHER): Payer: Medicare Other | Admitting: Surgery

## 2019-04-01 ENCOUNTER — Other Ambulatory Visit: Payer: Self-pay | Admitting: Internal Medicine

## 2019-04-01 ENCOUNTER — Ambulatory Visit
Admission: RE | Admit: 2019-04-01 | Discharge: 2019-04-01 | Disposition: A | Discharge status: Home / Self Care | Payer: Medicare Other | Source: Ambulatory Visit | Attending: Surgery | Admitting: Surgery

## 2019-04-01 ENCOUNTER — Other Ambulatory Visit: Payer: Self-pay

## 2019-04-01 ENCOUNTER — Encounter: Payer: Self-pay | Admitting: Surgery

## 2019-04-01 ENCOUNTER — Ambulatory Visit
Admission: RE | Admit: 2019-04-01 | Discharge: 2019-04-01 | Disposition: A | Discharge status: Home / Self Care | Payer: Medicare Other | Source: Ambulatory Visit | Attending: Internal Medicine | Admitting: Internal Medicine

## 2019-04-01 ENCOUNTER — Other Ambulatory Visit: Payer: Self-pay | Admitting: Surgery

## 2019-04-01 VITALS — BP 143/70 | HR 79 | Temp 97.3°F | Resp 20 | Ht 67.0 in | Wt 154.6 lb

## 2019-04-01 DIAGNOSIS — M25562 Pain in left knee: Secondary | ICD-10-CM

## 2019-04-01 DIAGNOSIS — D190 Benign neoplasm of mesothelial tissue of pleura: Secondary | ICD-10-CM | POA: Diagnosis not present

## 2019-04-01 DIAGNOSIS — J90 Pleural effusion, not elsewhere classified: Secondary | ICD-10-CM

## 2019-04-01 DIAGNOSIS — S8992XA Unspecified injury of left lower leg, initial encounter: Secondary | ICD-10-CM | POA: Diagnosis not present

## 2019-04-01 DIAGNOSIS — Z8709 Personal history of other diseases of the respiratory system: Secondary | ICD-10-CM

## 2019-04-01 DIAGNOSIS — Z86018 Personal history of other benign neoplasm: Secondary | ICD-10-CM

## 2019-04-01 NOTE — Progress Notes (Signed)
HPI:  The patient returns to my office today for followup status post left thoracotomy and wedge resection of a left lower lobe lung mass on 12/03/2005. 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 Since I saw her about 1 year ago she has continued to do well. She denies any chest pain. She's had no shortness of breath.  She has a history of paroxysmal atrial fibrillation and is on Eliquis.  She reports that she recently had heme positive stool and is scheduled for a colonoscopy next week.  Current Outpatient Medications  Medication Sig Dispense Refill  . Biotin 10000 MCG TABS Take 1 tablet by mouth daily.    Marland Kitchen docusate sodium 100 MG CAPS Take 100 mg by mouth 2 (two) times daily. 10 capsule 0  . ELIQUIS 5 MG TABS tablet TAKE ONE TABLET TWICE DAILY 180 tablet 1  . fish oil-omega-3 fatty acids 1000 MG capsule Take 1 g by mouth daily.     . furosemide (LASIX) 20 MG tablet Take 20 mg by mouth daily as needed for fluid or edema.     . Glucosamine-Chondroit-Vit C-Mn (GLUCOSAMINE 1500 COMPLEX PO) Take 1 capsule by mouth 2 (two) times a day.    . ibuprofen (ADVIL,MOTRIN) 200 MG tablet Take 400 mg by mouth every 6 (six) hours as needed (pain). Pt rarely takes    . loratadine (CLARITIN) 10 MG tablet Take 10 mg by mouth daily as needed for allergies.    . Magnesium 250 MG TABS Take 250 mg by mouth daily.    Marland Kitchen tiZANidine (ZANAFLEX) 4 MG tablet Take 1 tablet (4 mg total) by mouth every 6 (six) hours as needed for muscle spasms. 30 tablet 0  . vitamin B-12 (CYANOCOBALAMIN) 1000 MCG tablet Take 1,000 mcg by mouth daily.     No current facility-administered medications for this visit.      Physical Exam: BP (!) 143/70 (BP Location: Right Arm)   Pulse 79   Temp (!) 97.3 F (36.3 C) (Skin)   Resp 20   Ht 5\' 7"  (1.702 m)   Wt 154 lb 9.6 oz (70.1 kg)   SpO2 97% Comment: RA  BMI 24.21 kg/m  She looks well. There is no cervical or supraclavicular adenopathy. Cardiac exam shows a  regular rate and rhythm and normal heart sounds. Lungs are clear.  Diagnostic Tests:  CLINICAL DATA:  Solitary fibrous tumor the pleura  EXAM: CHEST - 2 VIEW  COMPARISON:  April 02, 2018  FINDINGS: There is a peripheral, possibly pleural based opacity along the lateral right chest wall. Lungs are clear. Stable cardiomediastinal contours. Narrow AP diameter of the chest. No acute osseous abnormality.  IMPRESSION: Possibly pleural-based opacity along the lateral right chest wall may reflect reported solitary fibrous tumor. This was not present on the prior study.   Electronically Signed   By: Macy Mis M.D.   On: 04/01/2019 10:31   Impression:  Her chest x-ray shows a new pleural-based opacity along the lateral right chest wall.  This will require CT scan of the chest for further evaluation.  This certainly could be another solitary pleural fibrous tumor.  I reviewed the chest x-ray findings with her and answered her questions.  Plan:  She will be scheduled for a CT scan of the chest and I will see her back after that is completed to review the results and make further plans.  I spent 10 minutes performing this established patient evaluation and > 50%  of this time was spent face to face counseling and coordinating the surveillance of her previously resected solitary fibrous tumor of the pleura.    Gaye Pollack, MD [facility] [phone]

## 2019-04-07 ENCOUNTER — Ambulatory Visit (INDEPENDENT_AMBULATORY_CARE_PROVIDER_SITE_OTHER): Payer: Medicare Other

## 2019-04-07 ENCOUNTER — Other Ambulatory Visit: Payer: Self-pay | Admitting: Internal Medicine

## 2019-04-07 DIAGNOSIS — Z1159 Encounter for screening for other viral diseases: Secondary | ICD-10-CM

## 2019-04-08 LAB — SARS CORONAVIRUS 2 (TAT 6-24 HRS): SARS Coronavirus 2: NEGATIVE

## 2019-04-09 ENCOUNTER — Ambulatory Visit (AMBULATORY_SURGERY_CENTER): Payer: Medicare Other | Admitting: Internal Medicine

## 2019-04-09 ENCOUNTER — Encounter: Payer: Self-pay | Admitting: Internal Medicine

## 2019-04-09 ENCOUNTER — Other Ambulatory Visit: Payer: Self-pay

## 2019-04-09 VITALS — BP 126/55 | HR 67 | Temp 97.8°F | Resp 15 | Ht 67.0 in | Wt 154.0 lb

## 2019-04-09 DIAGNOSIS — D124 Benign neoplasm of descending colon: Secondary | ICD-10-CM

## 2019-04-09 DIAGNOSIS — K635 Polyp of colon: Secondary | ICD-10-CM

## 2019-04-09 DIAGNOSIS — D123 Benign neoplasm of transverse colon: Secondary | ICD-10-CM

## 2019-04-09 DIAGNOSIS — K5730 Diverticulosis of large intestine without perforation or abscess without bleeding: Secondary | ICD-10-CM

## 2019-04-09 DIAGNOSIS — R195 Other fecal abnormalities: Secondary | ICD-10-CM

## 2019-04-09 MED ORDER — SODIUM CHLORIDE 0.9 % IV SOLN: 500.0000 mL | Freq: Once | INTRAVENOUS | Status: DC

## 2019-04-09 NOTE — Patient Instructions (Signed)
4 polyps removed today. Diverticulosis noted. Resume Eliquis tomorrow.  Refer to MD managing this prescription for further instructions.       YOU HAD AN ENDOSCOPIC PROCEDURE TODAY AT [facility]:   Refer to the procedure report that was given to you for any specific questions about what was found during the examination.  If the procedure report does not answer your questions, please call your gastroenterologist to clarify.  If you requested that your care partner not be given the details of your procedure findings, then the procedure report has been included in a sealed envelope for you to review at your convenience later.  YOU SHOULD EXPECT: Some feelings of bloating in the abdomen. Passage of more gas than usual.  Walking can help get rid of the air that was put into your GI tract during the procedure and reduce the bloating. If you had a lower endoscopy (such as a colonoscopy or flexible sigmoidoscopy) you may notice spotting of blood in your stool or on the toilet paper. If you underwent a bowel prep for your procedure, you may not have a normal bowel movement for a few days.  Please Note:  You might notice some irritation and congestion in your nose or some drainage.  This is from the oxygen used during your procedure.  There is no need for concern and it should clear up in a day or so.  SYMPTOMS TO REPORT IMMEDIATELY:   Following lower endoscopy (colonoscopy or flexible sigmoidoscopy):  Excessive amounts of blood in the stool  Significant tenderness or worsening of abdominal pains  Swelling of the abdomen that is new, acute  Fever of 100F or higher   For urgent or emergent issues, a gastroenterologist can be reached at any hour by calling [phone].   DIET:  We do recommend a small meal at first, but then you may proceed to your regular diet.  Drink plenty of fluids but you should avoid alcoholic beverages for 24 hours.  ACTIVITY:  You should plan to take  it easy for the rest of today and you should NOT DRIVE or use heavy machinery until tomorrow (because of the sedation medicines used during the test).    FOLLOW UP: Our staff will call the number listed on your records 48-72 hours following your procedure to check on you and address any questions or concerns that you may have regarding the information given to you following your procedure. If we do not reach you, we will leave a message.  We will attempt to reach you two times.  During this call, we will ask if you have developed any symptoms of COVID 19. If you develop any symptoms (ie: fever, flu-like symptoms, shortness of breath, cough etc.) before then, please call [phone].  If you test positive for Covid 19 in the 2 weeks post procedure, please call and report this information to [place].    If any biopsies were taken you will be contacted by phone or by letter within the next 1-3 weeks.  Please call us at [phone] if you have not heard about the biopsies in 3 weeks.    SIGNATURES/CONFIDENTIALITY: You and/or your care partner have signed paperwork which will be entered into your electronic medical record.  These signatures attest to the fact that that the information above on your After Visit Summary has been reviewed and is understood.  Full responsibility of the confidentiality of this discharge information lies with you and/or your care-partner.

## 2019-04-09 NOTE — Op Note (Signed)
[facility] Patient Name: [name] Procedure Date: 04/09/2019 1:25 PM MRN: [id number] Endoscopist: Jerene Bears , MD Age: 83 Referring MD:  Date of Birth: 11-05-1935 Gender: Female Account #: [id number] Procedure:                Colonoscopy Indications:              Heme positive stool, previously colonoscopy > 10                            years ago Medicines:                Monitored Anesthesia Care Procedure:                Pre-Anesthesia Assessment:                           - Prior to the procedure, a History and Physical                            was performed, and patient medications and                            allergies were reviewed. The patient's tolerance of                            previous anesthesia was also reviewed. The risks                            and benefits of the procedure and the sedation                            options and risks were discussed with the patient.                            All questions were answered, and informed consent                            was obtained. Prior Anticoagulants: The patient has                            taken Eliquis (apixaban), last dose was 2 days                            prior to procedure. ASA Grade Assessment: III - A                            patient with severe systemic disease. After                            reviewing the risks and benefits, the patient was                            deemed in satisfactory condition to undergo the  procedure.                           After obtaining informed consent, the colonoscope                            was passed under direct vision. Throughout the                            procedure, the patient's blood pressure, pulse, and                            oxygen saturations were monitored continuously. The                            Colonoscope was introduced through the anus and                            advanced to the  cecum, identified by appendiceal                            orifice and ileocecal valve. The colonoscopy was                            performed with difficulty due to a tortuous colon.                            Successful completion of the procedure was aided by                            applying abdominal pressure. The patient tolerated                            the procedure well. The quality of the bowel                            preparation was good. The ileocecal valve,                            appendiceal orifice, and rectum were photographed. Scope In: 1:42:29 PM Scope Out: 2:08:19 PM Scope Withdrawal Time: 0 hours 13 minutes 57 seconds  Total Procedure Duration: 0 hours 25 minutes 50 seconds  Findings:                 The digital rectal exam was normal.                           Three sessile polyps were found in the transverse                            colon. The polyps were 3 to 7 mm in size. These                            polyps were removed with a cold snare. Resection  and retrieval were complete.                           A 6 mm polyp was found in the descending colon. The                            polyp was sessile. The polyp was removed with a                            cold snare. Resection and retrieval were complete.                           A few small-mouthed diverticula were found in the                            sigmoid colon.                           The retroflexed view of the distal rectum and anal                            verge was normal and showed no anal or rectal                            abnormalities. Complications:            No immediate complications. Estimated Blood Loss:     Estimated blood loss was minimal. Impression:               - Three 3 to 7 mm polyps in the transverse colon,                            removed with a cold snare. Resected and retrieved.                           - One 6 mm polyp in  the descending colon, removed                            with a cold snare. Resected and retrieved.                           - Diverticulosis in the sigmoid colon.                           - The distal rectum and anal verge are normal on                            retroflexion view. Recommendation:           - Patient has a contact number available for                            emergencies. The signs and symptoms of potential  delayed complications were discussed with the                            patient. Return to normal activities tomorrow.                            Written discharge instructions were provided to the                            patient.                           - Resume previous diet.                           - Continue present medications.                           - Resume Eliquis (apixaban) at prior dose tomorrow.                            Refer to managing physician for further adjustment                            of therapy.                           - Await pathology results.                           - No recommendation at this time regarding repeat                            colonoscopy based on current age and age at next                            surveillance interval. Jerene Bears, MD 04/09/2019 2:13:45 PM This report has been signed electronically.

## 2019-04-09 NOTE — Progress Notes (Signed)
Temp taken by JB VS taken DT

## 2019-04-09 NOTE — Progress Notes (Signed)
PT taken to PACU. Monitors in place. VSS. Report given to RN. 

## 2019-04-13 ENCOUNTER — Telehealth: Payer: Self-pay

## 2019-04-13 NOTE — Telephone Encounter (Signed)
  Follow up Call-  Call back number 04/09/2019  Post procedure Call Back phone  # [phone]  Permission to leave phone message Yes  Some recent data might be hidden     Patient questions:  Do you have a fever, pain , or abdominal swelling? No. Pain Score  0 *  Have you tolerated food without any problems? Yes.    Have you been able to return to your normal activities? Yes.    Do you have any questions about your discharge instructions: Diet   No. Medications  No. Follow up visit  No.  Do you have questions or concerns about your Care? No.  Actions: * If pain score is 4 or above: 1. No action needed, pain <4.Have you developed a fever since your procedure? no  2.   Have you had an respiratory symptoms (SOB or cough) since your procedure? no  3.   Have you tested positive for COVID 19 since your procedure no  4.   Have you had any family members/close contacts diagnosed with the COVID 19 since your procedure?  no   If yes to any of these questions please route to Joylene John, RN and Alphonsa Gin, Therapist, sports.

## 2019-04-15 ENCOUNTER — Encounter: Payer: Self-pay | Admitting: Surgery

## 2019-04-15 ENCOUNTER — Other Ambulatory Visit: Payer: Self-pay

## 2019-04-15 ENCOUNTER — Encounter: Payer: Self-pay | Admitting: Internal Medicine

## 2019-04-15 ENCOUNTER — Ambulatory Visit (INDEPENDENT_AMBULATORY_CARE_PROVIDER_SITE_OTHER): Payer: Medicare Other | Admitting: Surgery

## 2019-04-15 ENCOUNTER — Ambulatory Visit
Admission: RE | Admit: 2019-04-15 | Discharge: 2019-04-15 | Disposition: A | Discharge status: Home / Self Care | Payer: Medicare Other | Source: Ambulatory Visit | Attending: Surgery | Admitting: Surgery

## 2019-04-15 VITALS — BP 132/76 | HR 75 | Temp 98.1°F | Resp 16 | Ht 67.0 in | Wt 154.0 lb

## 2019-04-15 DIAGNOSIS — Z8709 Personal history of other diseases of the respiratory system: Secondary | ICD-10-CM

## 2019-04-15 DIAGNOSIS — J90 Pleural effusion, not elsewhere classified: Secondary | ICD-10-CM

## 2019-04-15 DIAGNOSIS — Z86018 Personal history of other benign neoplasm: Secondary | ICD-10-CM

## 2019-04-15 NOTE — Progress Notes (Signed)
HPI:  [name] returns today to review the results of a CT scan done today to evaluate a right pleural-based density seen on chest x-ray 04/01/2019.  Current Outpatient Medications  Medication Sig Dispense Refill  . acetaminophen (TYLENOL) 500 MG tablet Take 500 mg by mouth every 8 (eight) hours as needed for moderate pain (Takes 1-2 tabs prn knee pain).    Marland Kitchen ELIQUIS 5 MG TABS tablet TAKE ONE TABLET TWICE DAILY 180 tablet 1  . fish oil-omega-3 fatty acids 1000 MG capsule Take 1 g by mouth daily.     . Glucosamine-Chondroit-Vit C-Mn (GLUCOSAMINE 1500 COMPLEX PO) Take 1 capsule by mouth 2 (two) times a day.    . docusate sodium 100 MG CAPS Take 100 mg by mouth 2 (two) times daily. (Patient not taking: Reported on 04/15/2019) 10 capsule 0  . furosemide (LASIX) 20 MG tablet Take 20 mg by mouth daily as needed for fluid or edema.     Marland Kitchen ibuprofen (ADVIL,MOTRIN) 200 MG tablet Take 400 mg by mouth every 6 (six) hours as needed (pain). Pt rarely takes    . loratadine (CLARITIN) 10 MG tablet Take 10 mg by mouth daily as needed for allergies.    Marland Kitchen tiZANidine (ZANAFLEX) 4 MG tablet Take 1 tablet (4 mg total) by mouth every 6 (six) hours as needed for muscle spasms. (Patient not taking: Reported on 04/15/2019) 30 tablet 0   No current facility-administered medications for this visit.     Physical Exam: BP 132/76 (BP Location: Right Arm)   Pulse 75   Temp 98.1 F (36.7 C) (Skin)   Resp 16   Ht 5\' 7"  (1.702 m)   Wt 154 lb (69.9 kg)   SpO2 94% Comment: RA  BMI 24.12 kg/m    Diagnostic Tests:  CLINICAL DATA:  Abnormal chest radiograph. Pleural effusion, shortness of breath, left VATS.  EXAM: CT CHEST WITHOUT CONTRAST  TECHNIQUE: Multidetector CT imaging of the chest was performed following the standard protocol without IV contrast.  COMPARISON:  Chest radiograph 04/01/2019 and CT chest 09/03/2005.  FINDINGS: Cardiovascular: Atherosclerotic calcification of the aorta and  left main coronary artery. Heart size within normal limits. No pericardial effusion.  Mediastinum/Nodes: No pathologically enlarged mediastinal or axillary lymph nodes. Hilar regions are difficult to evaluate without IV contrast but appear grossly unremarkable. Esophagus is grossly unremarkable.  Lungs/Pleura: Right apical pleuroparenchymal scarring. Additional subpleural scarring in the lateral right upper lobe. A few scattered pulmonary nodules measure 4 mm or less in size. Mild postoperative scarring in the posterior left lower lobe. No associated pleural thickening. No pleural fluid. Airway is unremarkable. Pectus deformity.  Upper Abdomen: Visualized portions of the liver, gallbladder, adrenal glands and right kidney are unremarkable. There may be left renal sinus cysts. Visualized portions of the spleen, pancreas, stomach and bowel are grossly unremarkable.  Musculoskeletal: Degenerative changes in the spine. No worrisome lytic or sclerotic lesions. L1 compression deformity is unchanged from 04/01/2019.  IMPRESSION: 1. No evidence of a lung mass to correspond to the questioned abnormality on recent chest radiograph. 2. Scattered tiny pulmonary nodules. No follow-up needed if patient is low-risk (and has no known or suspected primary neoplasm). Non-contrast chest CT can be considered in 12 months if patient is high-risk. This recommendation follows the consensus statement: Guidelines for Management of Incidental Pulmonary Nodules Detected on CT Images: From the Fleischner Society 2017; Radiology 2017; 284:228-243. 3. Aortic atherosclerosis (ICD10-170.0). Left main coronary artery calcification.   Electronically Signed  By: Lorin Picket M.D.   On: 04/15/2019 14:28   Impression:  The CT scan shows no evidence of a lung mass or pleural mass to correspond to the abnormality seen on the chest x-ray.  I suspect this was an artifact due to a skinfold.  There  are scattered tiny pulmonary nodules which are of no significance.  I do not think this requires further follow-up.  I reviewed the CT images with her and answered her questions.  I have recommended that she have another chest x-ray done in 1 year.  Plan:  She will return to see me in 1 year with a chest x-ray.   Gaye Pollack, MD [facility] [phone]

## 2019-06-07 ENCOUNTER — Ambulatory Visit: Payer: Medicare Other

## 2019-06-08 DIAGNOSIS — M25562 Pain in left knee: Secondary | ICD-10-CM | POA: Diagnosis not present

## 2019-06-08 DIAGNOSIS — M1712 Unilateral primary osteoarthritis, left knee: Secondary | ICD-10-CM | POA: Diagnosis not present

## 2019-06-09 DIAGNOSIS — H401212 Low-tension glaucoma, right eye, moderate stage: Secondary | ICD-10-CM | POA: Diagnosis not present

## 2019-06-09 DIAGNOSIS — H401221 Low-tension glaucoma, left eye, mild stage: Secondary | ICD-10-CM | POA: Diagnosis not present

## 2019-06-26 ENCOUNTER — Ambulatory Visit: Payer: Medicare Other

## 2019-07-20 DIAGNOSIS — J309 Allergic rhinitis, unspecified: Secondary | ICD-10-CM | POA: Diagnosis not present

## 2019-07-20 DIAGNOSIS — H6121 Impacted cerumen, right ear: Secondary | ICD-10-CM | POA: Diagnosis not present

## 2019-07-20 DIAGNOSIS — H8110 Benign paroxysmal vertigo, unspecified ear: Secondary | ICD-10-CM | POA: Diagnosis not present

## 2019-07-20 DIAGNOSIS — Z7901 Long term (current) use of anticoagulants: Secondary | ICD-10-CM | POA: Diagnosis not present

## 2019-07-20 DIAGNOSIS — G4762 Sleep related leg cramps: Secondary | ICD-10-CM | POA: Diagnosis not present

## 2019-07-20 DIAGNOSIS — I480 Paroxysmal atrial fibrillation: Secondary | ICD-10-CM | POA: Diagnosis not present

## 2019-07-20 DIAGNOSIS — R110 Nausea: Secondary | ICD-10-CM | POA: Diagnosis not present

## 2019-07-20 DIAGNOSIS — R52 Pain, unspecified: Secondary | ICD-10-CM | POA: Diagnosis not present

## 2019-07-20 DIAGNOSIS — R42 Dizziness and giddiness: Secondary | ICD-10-CM | POA: Diagnosis not present

## 2019-07-29 DIAGNOSIS — M25562 Pain in left knee: Secondary | ICD-10-CM | POA: Diagnosis not present

## 2019-08-14 DIAGNOSIS — H6121 Impacted cerumen, right ear: Secondary | ICD-10-CM | POA: Diagnosis not present

## 2019-09-24 ENCOUNTER — Telehealth: Payer: Self-pay

## 2019-09-24 ENCOUNTER — Other Ambulatory Visit: Payer: Self-pay

## 2019-09-24 ENCOUNTER — Telehealth (INDEPENDENT_AMBULATORY_CARE_PROVIDER_SITE_OTHER): Payer: Medicare Other | Admitting: Internal Medicine

## 2019-09-24 VITALS — Ht 65.0 in | Wt 146.5 lb

## 2019-09-24 DIAGNOSIS — I493 Ventricular premature depolarization: Secondary | ICD-10-CM

## 2019-09-24 DIAGNOSIS — I5030 Unspecified diastolic (congestive) heart failure: Secondary | ICD-10-CM | POA: Diagnosis not present

## 2019-09-24 DIAGNOSIS — I480 Paroxysmal atrial fibrillation: Secondary | ICD-10-CM | POA: Diagnosis not present

## 2019-09-24 NOTE — Telephone Encounter (Signed)
  Patient Consent for Virtual Visit         [name] has provided verbal consent on 09/24/2019 for a virtual visit (video or telephone).   CONSENT FOR VIRTUAL VISIT FOR:  [name]  By participating in this virtual visit I agree to the following:  I hereby voluntarily request, consent and authorize [facility] and its employed or contracted physicians, physician assistants, nurse practitioners or other licensed health care professionals (the Practitioner), to provide me with telemedicine health care services (the "Services") as deemed necessary by the treating Practitioner. I acknowledge and consent to receive the Services by the Practitioner via telemedicine. I understand that the telemedicine visit will involve communicating with the Practitioner through live audiovisual communication technology and the disclosure of certain medical information by electronic transmission. I acknowledge that I have been given the opportunity to request an in-person assessment or other available alternative prior to the telemedicine visit and am voluntarily participating in the telemedicine visit.  I understand that I have the right to withhold or withdraw my consent to the use of telemedicine in the course of my care at any time, without affecting my right to future care or treatment, and that the Practitioner or I may terminate the telemedicine visit at any time. I understand that I have the right to inspect all information obtained and/or recorded in the course of the telemedicine visit and may receive copies of available information for a reasonable fee.  I understand that some of the potential risks of receiving the Services via telemedicine include:  Marland Kitchen Delay or interruption in medical evaluation due to technological equipment failure or disruption; . Information transmitted may not be sufficient (e.g. poor resolution of images) to allow for appropriate medical decision making by the Practitioner; and/or    . In rare instances, security protocols could fail, causing a breach of personal health information.  Furthermore, I acknowledge that it is my responsibility to provide information about my medical history, conditions and care that is complete and accurate to the best of my ability. I acknowledge that Practitioner's advice, recommendations, and/or decision may be based on factors not within their control, such as incomplete or inaccurate data provided by me or distortions of diagnostic images or specimens that may result from electronic transmissions. I understand that the practice of medicine is not an exact science and that Practitioner makes no warranties or guarantees regarding treatment outcomes. I acknowledge that a copy of this consent can be made available to me via my patient portal ([facility]), or I can request a printed copy by calling the office of [facility].    I understand that my insurance will be billed for this visit.   I have read or had this consent read to me. . I understand the contents of this consent, which adequately explains the benefits and risks of the Services being provided via telemedicine.  . I have been provided ample opportunity to ask questions regarding this consent and the Services and have had my questions answered to my satisfaction. . I give my informed consent for the services to be provided through the use of telemedicine in my medical care

## 2019-09-24 NOTE — Patient Instructions (Signed)
Medication Instructions:  Your physician recommends that you continue on your current medications as directed. Please refer to the Current Medication list given to you today.  *If you need a refill on your cardiac medications before your next appointment, please call your pharmacy*   Lab Work: BNP 09/29/2019 at 2pm  If you have labs (blood work) drawn today and your tests are completely normal, you will receive your results only by: Marland Kitchen MyChart Message (if you have MyChart) OR . A paper copy in the mail If you have any lab test that is abnormal or we need to change your treatment, we will call you to review the results.   Testing/Procedures: EKG 09/29/2019 at 2pm   Follow-Up: At [facility], you and your health needs are our priority.  As part of our continuing mission to provide you with exceptional heart care, we have created designated Provider Care Teams.  These Care Teams include your primary Cardiologist (physician) and Advanced Practice Providers (APPs -  Physician Assistants and Nurse Practitioners) who all work together to provide you with the care you need, when you need it.  We recommend signing up for the patient portal called "MyChart".  Sign up information is provided on this After Visit Summary.  MyChart is used to connect with patients for Virtual Visits (Telemedicine).  Patients are able to view lab/test results, encounter notes, upcoming appointments, etc.  Non-urgent messages can be sent to your provider as well.   To learn more about what you can do with MyChart, go to [email or web address].    Your next appointment:   6 months with Dr Caryl Comes  Nurse Visit for EKG and BNP (lab) 09/29/2019 at 2pm.

## 2019-09-24 NOTE — Progress Notes (Signed)
Electrophysiology TeleHealth Note   Due to national recommendations of social distancing due to COVID 19, an audio/video telehealth visit is felt to be most appropriate for this patient at this time.  See MyChart message from today for the patient's consent to telehealth for [name].   Date:  09/24/2019   ID:  [name], DOB [birth year], MRN [id number]  Location: patient's home  Provider location: [address], [place]  Evaluation Performed: Follow-up visit  PCP:  Shon Baton, MD  Cardiologist:    Electrophysiologist:  SK   Chief Complaint:  *atrial fib   History of Present Illness:    [name] is a 84 y.o. female who presents via audio/video conferencing for a telehealth visit today.  Since last being seen in our clinic for atrial fib, the patient reports interval episode of vertigo, assoc with pain in legs-- saw PCP >> vestibular  ENT-resolved  Dizzy, LH , crummy-- interval afib.  Exhaustion with bendopnea; DOE   No orthopnea  DATE TEST EF   4/13 Echo  55-65 %   4/15 Echo  55-65%           Date Cr K Hgb  1/16 0.67  11.2  7/18 0.92  15.0  10/18   15.4  10/19 0.9 4.9 15.1          The patient denies symptoms of fevers, chills, cough, or new SOB worrisome for COVID 19.    Past Medical History:  Diagnosis Date  . Allergic rhinitis   . Arthritis   . Atrial fibrillation ([place])   . BCC (basal cell carcinoma)   . Benign tumor of pleura 2007  . Bursitis   . Bursitis of shoulder   . Cataracts, bilateral   . Complication of anesthesia   . Cough since 05-09-2014   with sore throat  . Diastolic dysfunction   . Fracture    at l1  . Fracture    right arm x 2  . Glaucoma (increased eye pressure)   . Hearing loss   . History of blood transfusion age 59 or 21  . Hyperlipidemia   . IBS (irritable bowel syndrome)   . Insomnia   . Lung tumor (benign)   . OA (osteoarthritis)   . Osteopenia   . Paroxysmal atrial  fibrillation (HCC)   . PONV (postoperative nausea and vomiting) 48 yrs ago   with ether  . Sarcoidosis    As a child  . Seasonal allergies   . Tortuous aortic arch     Past Surgical History:  Procedure Laterality Date  . ABDOMINAL HYSTERECTOMY     complete  . APPENDECTOMY    . FRACTURE SURGERY Right 2007  . Left video-assisted thoracoscopy, left thoracotomy with wedge resection of left lower lobe lung mass.  Insertion of On-Q pain pump  12/03/2005   [facility]  . ROTATOR CUFF REPAIR Left 2008  . TONSILLECTOMY  as child  . TOTAL HIP ARTHROPLASTY Right 05/18/2014   Procedure: RIGHT TOTAL HIP ARTHROPLASTY ANTERIOR APPROACH;  Surgeon: [name], MD;  Location: [facility];  Service: Orthopedics;  Laterality: Right;  Marland Kitchen VAGINAL HYSTERECTOMY    . WRIST SURGERY      Current Outpatient Medications  Medication Sig Dispense Refill  . acetaminophen (TYLENOL) 500 MG tablet Take 500 mg by mouth every 8 (eight) hours as needed for moderate pain (Takes 1-2 tabs prn knee pain).    . Biotin 1 MG CAPS Take by  mouth.    . docusate sodium 100 MG CAPS Take 100 mg by mouth 2 (two) times daily. 10 capsule 0  . ELIQUIS 5 MG TABS tablet TAKE ONE TABLET TWICE DAILY 180 tablet 1  . fish oil-omega-3 fatty acids 1000 MG capsule Take 1 g by mouth daily.     . fluticasone (FLONASE) 50 MCG/ACT nasal spray Place into both nostrils daily.    . furosemide (LASIX) 20 MG tablet Take 20 mg by mouth daily as needed for fluid or edema.     . Glucosamine-Chondroit-Vit C-Mn (GLUCOSAMINE 1500 COMPLEX PO) Take 1 capsule by mouth 2 (two) times a day.    . ibuprofen (ADVIL,MOTRIN) 200 MG tablet Take 400 mg by mouth every 6 (six) hours as needed (pain). Pt rarely takes    . loratadine (CLARITIN) 10 MG tablet Take 10 mg by mouth daily as needed for allergies.    Marland Kitchen tiZANidine (ZANAFLEX) 4 MG tablet Take 1 tablet (4 mg total) by mouth every 6 (six) hours as needed for muscle spasms. (Patient not taking: Reported on 04/15/2019) 30 tablet  0   No current facility-administered medications for this visit.    Allergies:   Poison ivy extract   Social History:  The patient  reports that she has never smoked. She has never used smokeless tobacco. She reports current alcohol use. She reports that she does not use drugs.   Family History:  The patient's   family history includes Bladder Cancer in her brother; CVA in her father; Cancer in her maternal grandfather; Colon cancer in her maternal aunt; Heart attack in her mother; Heart disease in her brother and father; Rheumatic fever in her mother.   ROS:  Please see the history of present illness.   All other systems are personally reviewed and negative.    Exam:    Vital Signs:  Ht 5\' 5"  (1.651 m)   Wt 146 lb 8 oz (66.5 kg)   BMI 24.38 kg/m     Well appearing, alert and conversant, regular work of breathing,  good skin color Eyes- anicteric, neuro- grossly intact, skin- no apparent rash or lesions or cyanosis, mouth- oral mucosa is pink   Labs/Other Tests and Data Reviewed:    Recent Labs: No results found for requested labs within last 8760 hours.   Wt Readings from Last 3 Encounters:  09/24/19 146 lb 8 oz (66.5 kg)  04/15/19 154 lb (69.9 kg)  04/09/19 154 lb (69.9 kg)     Other studies personally reviewed: Additional studies/ records that were reviewed today include     ASSESSMENT & PLAN:   Paroxysmal atrial fibrillation  HFpEF   PVC/PACs  quiescient  Orthostatic LH   Will need to reassess BP - orthostatics  Have asked to come ort office for and ECG  Persistent AFib could explain the persistence of the symptoms of SOB benodpnea fatigue and LH  If ECG unrevealing would get an echo  [name] whether the episode was a stroke and may benefit from neuroimaging   Not fluid overloaded by report but with hx ofHFpEF will get BNP at time of ECG    COVID 19 screen The patient denies symptoms of COVID 19 at this time.  The importance of social distancing was  discussed today.  Follow-up: 16m Next remote: na*  Current medicines are reviewed at length with the patient today.   The patient does not have concerns regarding her medicines.  The following changes were made today:  none  Labs/ tests ordered today include: ECG in office, BNP,   If no AFib on ECG will get echo  No orders of the defined types were placed in this encounter.   Future tests ( post COVID )     Patient Risk:  after full review of this patients clinical status, I feel that they are at moderate risk at this time.  Today, I have spent 21 minutes with the patient with telehealth technology discussing the above.  Signed, Virl Axe, MD  09/24/2019 3:38 PM     [place] [address] [place] [address] [phone] (office) [phone] (fax)

## 2019-09-25 ENCOUNTER — Telehealth: Payer: Self-pay | Admitting: Internal Medicine

## 2019-09-25 NOTE — Telephone Encounter (Signed)
97Spoke with pt and advised of nurse visit appointment date and time of 09/29/2019 at 2pm for EKG and labs.   Pt provided orthostatics of 118/78 lying HR- 72, 118/70 sitting HR - 81, 110/64 standing  HR - 97, 110/70 standing at 3 minutes HR - 87.  Pt verbalizes understanding of appointment date and time and agrees with current plan.

## 2019-09-25 NOTE — Telephone Encounter (Signed)
Follow Up: ° ° ° °Returning your call from yesterday. °

## 2019-09-29 ENCOUNTER — Ambulatory Visit (INDEPENDENT_AMBULATORY_CARE_PROVIDER_SITE_OTHER): Payer: Medicare Other | Admitting: Internal Medicine

## 2019-09-29 ENCOUNTER — Other Ambulatory Visit: Payer: Self-pay

## 2019-09-29 VITALS — BP 109/67 | HR 78 | Wt 152.2 lb

## 2019-09-29 DIAGNOSIS — I4891 Unspecified atrial fibrillation: Secondary | ICD-10-CM | POA: Diagnosis not present

## 2019-09-29 DIAGNOSIS — R0600 Dyspnea, unspecified: Secondary | ICD-10-CM | POA: Diagnosis not present

## 2019-09-29 NOTE — Patient Instructions (Signed)
Medication Instructions:  Your physician recommends that you continue on your current medications as directed. Please refer to the Current Medication list given to you today.  Labwork: BNP today   Testing/Procedures: Your physician has requested that you have an echocardiogram. Echocardiography is a painless test that uses sound waves to create images of your heart. It provides your doctor with information about the size and shape of your heart and how well your heart's chambers and valves are working. This procedure takes approximately one hour. There are no restrictions for this procedure.   Follow-Up: Your physician wants you to follow-up in:to be determined. You will receive a reminder letter in the mail two months in advance. If you don't receive a letter, please call our office to schedule the follow-up appointment.   Any Other Special Instructions Will Be Listed Below (If Applicable).  If you need a refill on your cardiac medications before your next appointment, please call your pharmacy.

## 2019-09-29 NOTE — Progress Notes (Signed)
Pt seen today per request of Dr Caryl Comes to evaluate if pt is currently in AFIB per pt's telehealth visit 09/24/2019.  Pt with history of Afib, palpitations and dyspnea on exertion.  Pt reports she is feeling well today with no complaints.  EKG completed and reviewed by Dr Caryl Comes who states no AFIB at this time. Echo and BNP ordered per Dr Caryl Comes today.

## 2019-09-30 DIAGNOSIS — Z85828 Personal history of other malignant neoplasm of skin: Secondary | ICD-10-CM | POA: Diagnosis not present

## 2019-09-30 DIAGNOSIS — L820 Inflamed seborrheic keratosis: Secondary | ICD-10-CM | POA: Diagnosis not present

## 2019-09-30 DIAGNOSIS — L821 Other seborrheic keratosis: Secondary | ICD-10-CM | POA: Diagnosis not present

## 2019-09-30 DIAGNOSIS — D171 Benign lipomatous neoplasm of skin and subcutaneous tissue of trunk: Secondary | ICD-10-CM | POA: Diagnosis not present

## 2019-09-30 DIAGNOSIS — L570 Actinic keratosis: Secondary | ICD-10-CM | POA: Diagnosis not present

## 2019-09-30 DIAGNOSIS — L720 Epidermal cyst: Secondary | ICD-10-CM | POA: Diagnosis not present

## 2019-09-30 DIAGNOSIS — L245 Irritant contact dermatitis due to other chemical products: Secondary | ICD-10-CM | POA: Diagnosis not present

## 2019-09-30 LAB — PRO B NATRIURETIC PEPTIDE: NT-Pro BNP: 482 pg/mL (ref 0–738)

## 2019-10-05 ENCOUNTER — Other Ambulatory Visit: Payer: Self-pay | Admitting: Internal Medicine

## 2019-10-05 NOTE — Telephone Encounter (Signed)
Pt last saw Dr Caryl Comes 09/24/19 video visit Covid-19, last labs 02/16/19 Creat 0.8 at [facility], age 84, weight 69kg, based on specified criteria pt is on appropriate dosage of Eliquis 5mg  BID.  Will refill rx.

## 2019-10-21 ENCOUNTER — Other Ambulatory Visit: Payer: Self-pay

## 2019-10-21 ENCOUNTER — Ambulatory Visit (HOSPITAL_COMMUNITY): Payer: Medicare Other | Attending: Internal Medicine

## 2019-10-21 DIAGNOSIS — R0600 Dyspnea, unspecified: Secondary | ICD-10-CM

## 2019-10-21 DIAGNOSIS — I4891 Unspecified atrial fibrillation: Secondary | ICD-10-CM | POA: Diagnosis not present

## 2019-12-02 ENCOUNTER — Other Ambulatory Visit: Payer: Self-pay

## 2019-12-02 DIAGNOSIS — I359 Nonrheumatic aortic valve disorder, unspecified: Secondary | ICD-10-CM

## 2019-12-02 DIAGNOSIS — I480 Paroxysmal atrial fibrillation: Secondary | ICD-10-CM

## 2019-12-02 DIAGNOSIS — I5020 Unspecified systolic (congestive) heart failure: Secondary | ICD-10-CM

## 2019-12-02 NOTE — Progress Notes (Signed)
Order placed per Dr Olin Pia request for f/u echo in 6 months.

## 2019-12-21 DIAGNOSIS — H35033 Hypertensive retinopathy, bilateral: Secondary | ICD-10-CM | POA: Diagnosis not present

## 2019-12-21 DIAGNOSIS — H26493 Other secondary cataract, bilateral: Secondary | ICD-10-CM | POA: Diagnosis not present

## 2019-12-21 DIAGNOSIS — H401221 Low-tension glaucoma, left eye, mild stage: Secondary | ICD-10-CM | POA: Diagnosis not present

## 2019-12-21 DIAGNOSIS — H401212 Low-tension glaucoma, right eye, moderate stage: Secondary | ICD-10-CM | POA: Diagnosis not present

## 2020-01-01 DIAGNOSIS — H1045 Other chronic allergic conjunctivitis: Secondary | ICD-10-CM | POA: Diagnosis not present

## 2020-01-01 DIAGNOSIS — H401221 Low-tension glaucoma, left eye, mild stage: Secondary | ICD-10-CM | POA: Diagnosis not present

## 2020-01-01 DIAGNOSIS — H401212 Low-tension glaucoma, right eye, moderate stage: Secondary | ICD-10-CM | POA: Diagnosis not present

## 2020-01-01 DIAGNOSIS — H04123 Dry eye syndrome of bilateral lacrimal glands: Secondary | ICD-10-CM | POA: Diagnosis not present

## 2020-01-30 DIAGNOSIS — Z23 Encounter for immunization: Secondary | ICD-10-CM | POA: Diagnosis not present

## 2020-02-08 ENCOUNTER — Other Ambulatory Visit: Payer: Self-pay | Admitting: Internal Medicine

## 2020-02-08 DIAGNOSIS — Z23 Encounter for immunization: Secondary | ICD-10-CM | POA: Diagnosis not present

## 2020-02-08 DIAGNOSIS — Z1231 Encounter for screening mammogram for malignant neoplasm of breast: Secondary | ICD-10-CM

## 2020-02-18 DIAGNOSIS — E559 Vitamin D deficiency, unspecified: Secondary | ICD-10-CM | POA: Diagnosis not present

## 2020-02-18 DIAGNOSIS — E785 Hyperlipidemia, unspecified: Secondary | ICD-10-CM | POA: Diagnosis not present

## 2020-02-22 DIAGNOSIS — R82998 Other abnormal findings in urine: Secondary | ICD-10-CM | POA: Diagnosis not present

## 2020-02-25 DIAGNOSIS — D692 Other nonthrombocytopenic purpura: Secondary | ICD-10-CM | POA: Diagnosis not present

## 2020-02-25 DIAGNOSIS — N39498 Other specified urinary incontinence: Secondary | ICD-10-CM | POA: Diagnosis not present

## 2020-02-25 DIAGNOSIS — I2510 Atherosclerotic heart disease of native coronary artery without angina pectoris: Secondary | ICD-10-CM | POA: Diagnosis not present

## 2020-02-25 DIAGNOSIS — I5042 Chronic combined systolic (congestive) and diastolic (congestive) heart failure: Secondary | ICD-10-CM | POA: Diagnosis not present

## 2020-02-25 DIAGNOSIS — I480 Paroxysmal atrial fibrillation: Secondary | ICD-10-CM | POA: Diagnosis not present

## 2020-02-25 DIAGNOSIS — M8589 Other specified disorders of bone density and structure, multiple sites: Secondary | ICD-10-CM | POA: Diagnosis not present

## 2020-02-25 DIAGNOSIS — I5189 Other ill-defined heart diseases: Secondary | ICD-10-CM | POA: Diagnosis not present

## 2020-02-25 DIAGNOSIS — I2584 Coronary atherosclerosis due to calcified coronary lesion: Secondary | ICD-10-CM | POA: Diagnosis not present

## 2020-02-25 DIAGNOSIS — I700 Atherosclerosis of aorta: Secondary | ICD-10-CM | POA: Diagnosis not present

## 2020-02-25 DIAGNOSIS — Z0000 Encounter for general adult medical examination without abnormal findings: Secondary | ICD-10-CM | POA: Diagnosis not present

## 2020-02-25 DIAGNOSIS — I341 Nonrheumatic mitral (valve) prolapse: Secondary | ICD-10-CM | POA: Diagnosis not present

## 2020-02-25 DIAGNOSIS — E785 Hyperlipidemia, unspecified: Secondary | ICD-10-CM | POA: Diagnosis not present

## 2020-03-28 DIAGNOSIS — Z1212 Encounter for screening for malignant neoplasm of rectum: Secondary | ICD-10-CM | POA: Diagnosis not present

## 2020-03-29 ENCOUNTER — Other Ambulatory Visit: Payer: Self-pay

## 2020-03-29 ENCOUNTER — Ambulatory Visit
Admission: RE | Admit: 2020-03-29 | Discharge: 2020-03-29 | Disposition: A | Discharge status: Home / Self Care | Payer: Medicare Other | Source: Ambulatory Visit | Attending: Internal Medicine | Admitting: Internal Medicine

## 2020-03-29 DIAGNOSIS — Z1231 Encounter for screening mammogram for malignant neoplasm of breast: Secondary | ICD-10-CM | POA: Diagnosis not present

## 2020-04-05 ENCOUNTER — Other Ambulatory Visit: Payer: Self-pay | Admitting: Internal Medicine

## 2020-04-05 NOTE — Telephone Encounter (Signed)
Pt last saw Dr Caryl Comes 09/29/19, last labs 07/20/19 Creat 0.70 at [facility] per [place], age 84, weight 69kg, based on specified criteria pt is on appropriate dosage of Eliquis 5mg  BID.  Will refill rx.

## 2020-04-08 DIAGNOSIS — H04123 Dry eye syndrome of bilateral lacrimal glands: Secondary | ICD-10-CM | POA: Diagnosis not present

## 2020-04-08 DIAGNOSIS — H401212 Low-tension glaucoma, right eye, moderate stage: Secondary | ICD-10-CM | POA: Diagnosis not present

## 2020-04-08 DIAGNOSIS — H401221 Low-tension glaucoma, left eye, mild stage: Secondary | ICD-10-CM | POA: Diagnosis not present

## 2020-04-13 ENCOUNTER — Other Ambulatory Visit: Payer: Self-pay | Admitting: Surgery

## 2020-04-13 DIAGNOSIS — D190 Benign neoplasm of mesothelial tissue of pleura: Secondary | ICD-10-CM

## 2020-04-20 ENCOUNTER — Ambulatory Visit: Payer: Medicare Other | Admitting: Surgery

## 2020-05-10 ENCOUNTER — Other Ambulatory Visit: Payer: Self-pay | Admitting: *Deleted

## 2020-05-10 DIAGNOSIS — Z87898 Personal history of other specified conditions: Secondary | ICD-10-CM

## 2020-05-11 ENCOUNTER — Ambulatory Visit: Payer: Medicare Other | Admitting: Surgery

## 2020-05-23 DIAGNOSIS — M1712 Unilateral primary osteoarthritis, left knee: Secondary | ICD-10-CM | POA: Diagnosis not present

## 2020-05-25 ENCOUNTER — Ambulatory Visit
Admission: RE | Admit: 2020-05-25 | Discharge: 2020-05-25 | Disposition: A | Discharge status: Home / Self Care | Payer: Medicare Other | Source: Ambulatory Visit | Attending: Surgery | Admitting: Surgery

## 2020-05-25 ENCOUNTER — Ambulatory Visit (INDEPENDENT_AMBULATORY_CARE_PROVIDER_SITE_OTHER): Payer: Medicare Other | Admitting: Surgery

## 2020-05-25 ENCOUNTER — Other Ambulatory Visit: Payer: Self-pay

## 2020-05-25 VITALS — BP 133/49 | HR 83 | Resp 20

## 2020-05-25 DIAGNOSIS — Z87898 Personal history of other specified conditions: Secondary | ICD-10-CM

## 2020-05-25 DIAGNOSIS — Z86018 Personal history of other benign neoplasm: Secondary | ICD-10-CM

## 2020-05-25 DIAGNOSIS — Z8709 Personal history of other diseases of the respiratory system: Secondary | ICD-10-CM

## 2020-05-25 DIAGNOSIS — R918 Other nonspecific abnormal finding of lung field: Secondary | ICD-10-CM | POA: Diagnosis not present

## 2020-05-27 ENCOUNTER — Encounter: Payer: Self-pay | Admitting: Surgery

## 2020-05-27 NOTE — Progress Notes (Signed)
     HPI:  The patient returns to my office today for followup status post left thoracotomy and wedge resection of a left lower lobe lung mass on 12/03/2005. 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 She has a history of paroxysmal atrial fibrillation and is on Eliquis.  She denies any chest pain or shortness of breath.  Current Outpatient Medications  Medication Sig Dispense Refill  . acetaminophen (TYLENOL) 500 MG tablet Take 500 mg by mouth every 8 (eight) hours as needed for moderate pain (Takes 1-2 tabs prn knee pain).    . Biotin 1 MG CAPS Take by mouth.    . docusate sodium 100 MG CAPS Take 100 mg by mouth 2 (two) times daily. 10 capsule 0  . ELIQUIS 5 MG TABS tablet TAKE ONE TABLET TWICE DAILY 180 tablet 1  . fish oil-omega-3 fatty acids 1000 MG capsule Take 1 g by mouth daily.    . fluticasone (FLONASE) 50 MCG/ACT nasal spray Place into both nostrils daily.    . furosemide (LASIX) 20 MG tablet Take 20 mg by mouth daily as needed for fluid or edema.     . Glucosamine-Chondroit-Vit C-Mn (GLUCOSAMINE 1500 COMPLEX PO) Take 1 capsule by mouth 2 (two) times a day.    . ibuprofen (ADVIL,MOTRIN) 200 MG tablet Take 400 mg by mouth every 6 (six) hours as needed (pain). Pt rarely takes (Patient not taking: Reported on 05/25/2020)    . loratadine (CLARITIN) 10 MG tablet Take 10 mg by mouth daily as needed for allergies. (Patient not taking: Reported on 05/25/2020)    . tiZANidine (ZANAFLEX) 4 MG tablet Take 1 tablet (4 mg total) by mouth every 6 (six) hours as needed for muscle spasms. (Patient not taking: Reported on 05/25/2020) 30 tablet 0   No current facility-administered medications for this visit.     Physical Exam: BP (!) 133/49 (BP Location: Left Arm, Patient Position: Sitting)   Pulse 83   Resp 20   SpO2 95% Comment: RA with mask on She looks well. There is no cervical or supraclavicular adenopathy. Lung exam is clear. Cardiac exam shows a regular rate and rhythm  with normal heart sounds.  Diagnostic Tests:  Narrative & Impression  CLINICAL DATA:  History of pulmonary nodules  EXAM: CHEST - 2 VIEW  COMPARISON:  04/01/2019  FINDINGS: Heart is normal size. Previously seen right lateral pleural based density no longer visualized. Lungs clear. No effusions. No acute bony abnormality.  IMPRESSION: No active cardiopulmonary disease.   Electronically Signed   By: Rolm Baptise M.D.   On: 05/25/2020 15:04      Impression:  There is no pulmonary abnormality seen on chest x-ray.  The previously seen right lateral pleural-based density is no longer visualized and we had already done a follow-up CT scan of the chest to evaluate that, which did not show any abnormality on CT to correspond to it.  Plan:  I will plan to see her back in 1 year with a chest x-ray.  I spent 10 minutes performing this established patient evaluation and > 50% of this time was spent face to face counseling and coordinating the surveillance of her previously resected pleural-based tumor.    Gaye Pollack, MD [facility] [phone]

## 2020-06-02 ENCOUNTER — Ambulatory Visit (HOSPITAL_COMMUNITY): Payer: Medicare Other | Attending: Cardiology

## 2020-06-02 ENCOUNTER — Other Ambulatory Visit: Payer: Self-pay

## 2020-06-02 DIAGNOSIS — I359 Nonrheumatic aortic valve disorder, unspecified: Secondary | ICD-10-CM

## 2020-06-02 DIAGNOSIS — I480 Paroxysmal atrial fibrillation: Secondary | ICD-10-CM | POA: Diagnosis not present

## 2020-06-02 LAB — ECHOCARDIOGRAM COMPLETE
Area-P 1/2: 2.91 cm2
P 1/2 time: 418 msec
S' Lateral: 3.5 cm

## 2020-06-08 ENCOUNTER — Telehealth: Payer: Self-pay

## 2020-06-08 NOTE — Telephone Encounter (Signed)
-----   Message from Deboraha Sprang, MD sent at 06/07/2020  2:42 PM EST ----- Please Inform Patient Echo showed stable nad if anything heart muscle function is  a little better

## 2020-06-08 NOTE — Telephone Encounter (Signed)
Spoke with pt and advised per Dr Caryl Comes echo shows stable heart muscle function which is maybe even a little better.  Pt verbalizes understanding and thanked Therapist, sports for the call.

## 2020-06-14 ENCOUNTER — Ambulatory Visit: Payer: Medicare Other | Admitting: Internal Medicine

## 2020-08-26 DIAGNOSIS — H401221 Low-tension glaucoma, left eye, mild stage: Secondary | ICD-10-CM | POA: Diagnosis not present

## 2020-08-26 DIAGNOSIS — H401212 Low-tension glaucoma, right eye, moderate stage: Secondary | ICD-10-CM | POA: Diagnosis not present

## 2020-08-30 DIAGNOSIS — I700 Atherosclerosis of aorta: Secondary | ICD-10-CM | POA: Diagnosis not present

## 2020-08-30 DIAGNOSIS — I471 Supraventricular tachycardia: Secondary | ICD-10-CM | POA: Diagnosis not present

## 2020-08-30 DIAGNOSIS — I2584 Coronary atherosclerosis due to calcified coronary lesion: Secondary | ICD-10-CM | POA: Diagnosis not present

## 2020-08-30 DIAGNOSIS — I480 Paroxysmal atrial fibrillation: Secondary | ICD-10-CM | POA: Diagnosis not present

## 2020-08-30 DIAGNOSIS — Z7901 Long term (current) use of anticoagulants: Secondary | ICD-10-CM | POA: Diagnosis not present

## 2020-08-30 DIAGNOSIS — D692 Other nonthrombocytopenic purpura: Secondary | ICD-10-CM | POA: Diagnosis not present

## 2020-08-30 DIAGNOSIS — R222 Localized swelling, mass and lump, trunk: Secondary | ICD-10-CM | POA: Diagnosis not present

## 2020-08-30 DIAGNOSIS — I5189 Other ill-defined heart diseases: Secondary | ICD-10-CM | POA: Diagnosis not present

## 2020-08-30 DIAGNOSIS — M8589 Other specified disorders of bone density and structure, multiple sites: Secondary | ICD-10-CM | POA: Diagnosis not present

## 2020-08-30 DIAGNOSIS — E785 Hyperlipidemia, unspecified: Secondary | ICD-10-CM | POA: Diagnosis not present

## 2020-08-30 DIAGNOSIS — I5042 Chronic combined systolic (congestive) and diastolic (congestive) heart failure: Secondary | ICD-10-CM | POA: Diagnosis not present

## 2020-08-30 DIAGNOSIS — I2510 Atherosclerotic heart disease of native coronary artery without angina pectoris: Secondary | ICD-10-CM | POA: Diagnosis not present

## 2020-09-22 NOTE — Progress Notes (Signed)
Patient Care Team: Shon Baton, MD as PCP - General (Internal Medicine) Deboraha Sprang, MD as PCP - Cardiology (Cardiology) Gaye Pollack, MD (Cardiothoracic Surgery) Shon Baton, MD as Consulting Physician (Internal Medicine)  HPI  [name] is a 85 y.o. female Seen in followup for chief complaint of palpitations  associated w dizziness and fatigue History of chest pain but none recently.  Stress echo demonstrated no echocardiographic changes; images were poor.   Event recorder>>atrial fibrillation. She is managed with apixoban and is without bleeding   In her previous visit she had some palpitations. Also, had an episode of lightheadedness with standing. Reminiscent of prior episodes of orthostatic intolerance.   The patient denies chest pain, shortness of breath, nocturnal dyspnea, orthopnea or peripheral edema.  There have been no palpitations.   When she bends down she has to sit sometimes because her legs weaken. However she denies having significant dizziness with standing from seated position. No syncopal episodes.   She notes feeling fatigue prior to having shortness of breath.   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  Her husband health is somewhat improving.   Recently lost 25 lbs  Wt Readings from Last 3 Encounters:  09/23/20 133 lb (60.3 kg)  09/29/19 152 lb 3.2 oz (69 kg)  09/24/19 146 lb 8 oz (66.5 kg)    DATE TEST EF   4/13 Echo   55-65 %   4/15 Echo  55-65%   6/21 Echo 40-45%   2/22 Echo 45-50%     Date Cr K Hgb  1/16 0.67  11.2  7/18 0.92  15.0  10/18   15.4   10/19 0.9 4.9 15.1  11/20 0.8 5.3 14.9     Past Medical History:  Diagnosis Date  . Allergic rhinitis   . Arthritis   . Atrial fibrillation ([place])   . BCC (basal cell carcinoma)   . Benign tumor of pleura 2007  . Bursitis   . Bursitis of shoulder   . Cataracts, bilateral   . Complication of anesthesia   . Cough since 05-09-2014   with sore throat  .  Diastolic dysfunction   . Fracture    at l1  . Fracture    right arm x 2  . Glaucoma (increased eye pressure)   . Hearing loss   . History of blood transfusion age 34 or 7  . Hyperlipidemia   . IBS (irritable bowel syndrome)   . Insomnia   . Lung tumor (benign)   . OA (osteoarthritis)   . Osteopenia   . Paroxysmal atrial fibrillation (HCC)   . PONV (postoperative nausea and vomiting) 48 yrs ago   with ether  . Sarcoidosis    As a child  . Seasonal allergies   . Tortuous aortic arch     Past Surgical History:  Procedure Laterality Date  . ABDOMINAL HYSTERECTOMY     complete  . APPENDECTOMY    . FRACTURE SURGERY Right 2007  . Left video-assisted thoracoscopy, left thoracotomy with wedge resection of left lower lobe lung mass.  Insertion of On-Q pain pump  12/03/2005   [place]  . ROTATOR CUFF REPAIR Left 2008  . TONSILLECTOMY  as child  . TOTAL HIP ARTHROPLASTY Right 05/18/2014   Procedure: RIGHT TOTAL HIP ARTHROPLASTY ANTERIOR APPROACH;  Surgeon: [name], MD;  Location: [facility];  Service: Orthopedics;  Laterality: Right;  Marland Kitchen VAGINAL HYSTERECTOMY    . WRIST SURGERY  Current Outpatient Medications  Medication Sig Dispense Refill  . acetaminophen (TYLENOL) 500 MG tablet Take 500 mg by mouth every 8 (eight) hours as needed for moderate pain (Takes 1-2 tabs prn knee pain).    . Biotin 1 MG CAPS Take by mouth.    . docusate sodium 100 MG CAPS Take 100 mg by mouth 2 (two) times daily. 10 capsule 0  . ELIQUIS 5 MG TABS tablet TAKE ONE TABLET TWICE DAILY 180 tablet 1  . fish oil-omega-3 fatty acids 1000 MG capsule Take 1 g by mouth daily.    . fluticasone (FLONASE) 50 MCG/ACT nasal spray Place into both nostrils daily.    . furosemide (LASIX) 20 MG tablet Take 20 mg by mouth daily as needed for fluid or edema.     . Glucosamine-Chondroit-Vit C-Mn (GLUCOSAMINE 1500 COMPLEX PO) Take 1 capsule by mouth 2 (two) times a day.    . loratadine (CLARITIN) 10 MG tablet Take 10 mg by  mouth daily as needed for allergies.     No current facility-administered medications for this visit.    Allergies  Allergen Reactions  . Poison Ivy Extract     Severe hives, needed steroid taper last time    Review of Systems negative except from HPI and PMH  Physical Exam BP (!) 102/58   Pulse 77   Ht 5\' 5"  (1.651 m)   Wt 133 lb (60.3 kg)   SpO2 99%   BMI 22.13 kg/m  Well developed and nourished in no acute distress HENT normal Neck supple with JVP-  flat  Clear Regular rate and rhythm, no murmurs or gallops Abd-soft with active BS No Clubbing cyanosis tr Ledema Skin-warm and dry A & Oriented  Grossly normal sensory and motor function  ECG sinus @ 77 20/13/43 LBBB  Assessment and  Plan  Mitral valve prolapse  Paroxysmal atrial fibrillation   HFpEF   PVC quiescient  LBBB  Infrequent afib on anticoagulation wiil continue Apixoban but will reduce the dose to 2.5 bid 2/2 loss of weight and age No bleeding  Some fatigue with walking  Wonder whether it is related to BP as it is low. Will have her check with next event  Pt heart failure status is stable. Continue furosemide at 20 mg dose.  Electrolytes are stable.  LBBB stable will follow    Alopecia improved w stopping betablocker   Episode of lightheadedness likely was orthostatic by description.  Discontinuing her beta-blocker may help this as well as her baseline blood pressure is quite low.  We spent more than 50% of our >25 min visit in face to face counseling regarding the above  I,Alexis Bryant,acting as a scribe for Virl Axe, MD.,have documented all relevant documentation on the behalf of Virl Axe, MD,as directed by  Virl Axe, MD while in the presence of Virl Axe, MD.  I, Virl Axe, MD, have reviewed all documentation for this visit. The documentation on 09/23/20 for the exam, diagnosis, procedures, and orders are all accurate and complete.

## 2020-09-23 ENCOUNTER — Other Ambulatory Visit: Payer: Self-pay

## 2020-09-23 ENCOUNTER — Encounter: Payer: Self-pay | Admitting: Internal Medicine

## 2020-09-23 ENCOUNTER — Ambulatory Visit (INDEPENDENT_AMBULATORY_CARE_PROVIDER_SITE_OTHER): Payer: Medicare Other | Admitting: Internal Medicine

## 2020-09-23 VITALS — BP 102/58 | HR 77 | Ht 65.0 in | Wt 133.0 lb

## 2020-09-23 DIAGNOSIS — I480 Paroxysmal atrial fibrillation: Secondary | ICD-10-CM | POA: Diagnosis not present

## 2020-09-23 DIAGNOSIS — R0600 Dyspnea, unspecified: Secondary | ICD-10-CM

## 2020-09-23 DIAGNOSIS — R0609 Other forms of dyspnea: Secondary | ICD-10-CM

## 2020-09-23 MED ORDER — APIXABAN 2.5 MG PO TABS: 2.5000 mg | ORAL_TABLET | Freq: Two times a day (BID) | ORAL | 1 refills | Status: DC

## 2020-09-23 NOTE — Patient Instructions (Signed)
Medication Instructions:  Your physician has recommended you make the following change in your medication:   ** Stop Eliquis 5mg    **  Begin Eliquis 2.5mg  - 1 tablet by mouth twice daily  *If you need a refill on your cardiac medications before your next appointment, please call your pharmacy*   Lab Work: None ordered.  If you have labs (blood work) drawn today and your tests are completely normal, you will receive your results only by: Marland Kitchen MyChart Message (if you have MyChart) OR . A paper copy in the mail If you have any lab test that is abnormal or we need to change your treatment, we will call you to review the results.   Testing/Procedures: None ordered.    Follow-Up: At [facility], you and your health needs are our priority.  As part of our continuing mission to provide you with exceptional heart care, we have created designated Provider Care Teams.  These Care Teams include your primary Cardiologist (physician) and Advanced Practice Providers (APPs -  Physician Assistants and Nurse Practitioners) who all work together to provide you with the care you need, when you need it.  We recommend signing up for the patient portal called "MyChart".  Sign up information is provided on this After Visit Summary.  MyChart is used to connect with patients for Virtual Visits (Telemedicine).  Patients are able to view lab/test results, encounter notes, upcoming appointments, etc.  Non-urgent messages can be sent to your provider as well.   To learn more about what you can do with MyChart, go to [email or web address].    Your next appointment:   6 months with Dr Caryl Comes

## 2020-10-25 DIAGNOSIS — D171 Benign lipomatous neoplasm of skin and subcutaneous tissue of trunk: Secondary | ICD-10-CM | POA: Diagnosis not present

## 2020-10-25 DIAGNOSIS — L821 Other seborrheic keratosis: Secondary | ICD-10-CM | POA: Diagnosis not present

## 2020-10-25 DIAGNOSIS — L723 Sebaceous cyst: Secondary | ICD-10-CM | POA: Diagnosis not present

## 2020-10-25 DIAGNOSIS — L570 Actinic keratosis: Secondary | ICD-10-CM | POA: Diagnosis not present

## 2020-10-25 DIAGNOSIS — Z85828 Personal history of other malignant neoplasm of skin: Secondary | ICD-10-CM | POA: Diagnosis not present

## 2020-10-25 DIAGNOSIS — L853 Xerosis cutis: Secondary | ICD-10-CM | POA: Diagnosis not present

## 2020-10-25 DIAGNOSIS — L820 Inflamed seborrheic keratosis: Secondary | ICD-10-CM | POA: Diagnosis not present

## 2020-12-22 DIAGNOSIS — H04123 Dry eye syndrome of bilateral lacrimal glands: Secondary | ICD-10-CM | POA: Diagnosis not present

## 2020-12-22 DIAGNOSIS — H401221 Low-tension glaucoma, left eye, mild stage: Secondary | ICD-10-CM | POA: Diagnosis not present

## 2020-12-22 DIAGNOSIS — H401212 Low-tension glaucoma, right eye, moderate stage: Secondary | ICD-10-CM | POA: Diagnosis not present

## 2020-12-22 DIAGNOSIS — H1045 Other chronic allergic conjunctivitis: Secondary | ICD-10-CM | POA: Diagnosis not present

## 2021-01-05 ENCOUNTER — Other Ambulatory Visit: Payer: Self-pay

## 2021-01-05 ENCOUNTER — Encounter (INDEPENDENT_AMBULATORY_CARE_PROVIDER_SITE_OTHER): Payer: Medicare Other | Admitting: Ophthalmology

## 2021-01-05 DIAGNOSIS — H43813 Vitreous degeneration, bilateral: Secondary | ICD-10-CM

## 2021-01-05 DIAGNOSIS — H348112 Central retinal vein occlusion, right eye, stable: Secondary | ICD-10-CM | POA: Diagnosis not present

## 2021-01-05 DIAGNOSIS — D3132 Benign neoplasm of left choroid: Secondary | ICD-10-CM

## 2021-02-02 DIAGNOSIS — Z23 Encounter for immunization: Secondary | ICD-10-CM | POA: Diagnosis not present

## 2021-02-06 ENCOUNTER — Encounter (INDEPENDENT_AMBULATORY_CARE_PROVIDER_SITE_OTHER): Payer: Medicare Other | Admitting: Ophthalmology

## 2021-02-09 DIAGNOSIS — Z961 Presence of intraocular lens: Secondary | ICD-10-CM | POA: Diagnosis not present

## 2021-02-09 DIAGNOSIS — D3132 Benign neoplasm of left choroid: Secondary | ICD-10-CM | POA: Diagnosis not present

## 2021-02-09 DIAGNOSIS — H348112 Central retinal vein occlusion, right eye, stable: Secondary | ICD-10-CM | POA: Diagnosis not present

## 2021-02-16 ENCOUNTER — Other Ambulatory Visit: Payer: Self-pay

## 2021-02-16 ENCOUNTER — Encounter (INDEPENDENT_AMBULATORY_CARE_PROVIDER_SITE_OTHER): Payer: Medicare Other | Admitting: Ophthalmology

## 2021-02-16 DIAGNOSIS — H34811 Central retinal vein occlusion, right eye, with macular edema: Secondary | ICD-10-CM

## 2021-02-16 DIAGNOSIS — D3132 Benign neoplasm of left choroid: Secondary | ICD-10-CM

## 2021-02-16 DIAGNOSIS — H43813 Vitreous degeneration, bilateral: Secondary | ICD-10-CM | POA: Diagnosis not present

## 2021-02-27 ENCOUNTER — Other Ambulatory Visit: Payer: Self-pay | Admitting: Internal Medicine

## 2021-02-27 NOTE — Telephone Encounter (Addendum)
Eliquis 2.5mg  refill request received. Patient is 85 years old, weight-60kg, Crea-0.70 on 07/20/19 via [facility] at [facility], Diagnosis-Afib, and last seen by Dr. Caryl Comes on 09/23/2020. Dose is appropriate based on dosing criteria.   [facility] and they are going to fax over updated labs on the pt.  Received labs from [facility] & Crea-0.80 on 08/30/2020; will send in refill to requested pharmacy.

## 2021-03-10 DIAGNOSIS — E559 Vitamin D deficiency, unspecified: Secondary | ICD-10-CM | POA: Diagnosis not present

## 2021-03-10 DIAGNOSIS — E785 Hyperlipidemia, unspecified: Secondary | ICD-10-CM | POA: Diagnosis not present

## 2021-03-15 DIAGNOSIS — M1990 Unspecified osteoarthritis, unspecified site: Secondary | ICD-10-CM | POA: Diagnosis not present

## 2021-03-15 DIAGNOSIS — G4762 Sleep related leg cramps: Secondary | ICD-10-CM | POA: Diagnosis not present

## 2021-03-15 DIAGNOSIS — K589 Irritable bowel syndrome without diarrhea: Secondary | ICD-10-CM | POA: Diagnosis not present

## 2021-03-15 DIAGNOSIS — H3411 Central retinal artery occlusion, right eye: Secondary | ICD-10-CM | POA: Diagnosis not present

## 2021-03-15 DIAGNOSIS — Z0000 Encounter for general adult medical examination without abnormal findings: Secondary | ICD-10-CM | POA: Diagnosis not present

## 2021-03-15 DIAGNOSIS — H04129 Dry eye syndrome of unspecified lacrimal gland: Secondary | ICD-10-CM | POA: Diagnosis not present

## 2021-03-15 DIAGNOSIS — Z9181 History of falling: Secondary | ICD-10-CM | POA: Diagnosis not present

## 2021-03-15 DIAGNOSIS — Z1331 Encounter for screening for depression: Secondary | ICD-10-CM | POA: Diagnosis not present

## 2021-03-15 DIAGNOSIS — R195 Other fecal abnormalities: Secondary | ICD-10-CM | POA: Diagnosis not present

## 2021-03-15 DIAGNOSIS — I341 Nonrheumatic mitral (valve) prolapse: Secondary | ICD-10-CM | POA: Diagnosis not present

## 2021-03-15 DIAGNOSIS — R42 Dizziness and giddiness: Secondary | ICD-10-CM | POA: Diagnosis not present

## 2021-03-15 DIAGNOSIS — G4700 Insomnia, unspecified: Secondary | ICD-10-CM | POA: Diagnosis not present

## 2021-03-15 DIAGNOSIS — E559 Vitamin D deficiency, unspecified: Secondary | ICD-10-CM | POA: Diagnosis not present

## 2021-03-15 DIAGNOSIS — R82998 Other abnormal findings in urine: Secondary | ICD-10-CM | POA: Diagnosis not present

## 2021-03-15 DIAGNOSIS — Z1389 Encounter for screening for other disorder: Secondary | ICD-10-CM | POA: Diagnosis not present

## 2021-03-16 ENCOUNTER — Encounter (INDEPENDENT_AMBULATORY_CARE_PROVIDER_SITE_OTHER): Payer: Medicare Other | Admitting: Ophthalmology

## 2021-03-16 ENCOUNTER — Other Ambulatory Visit: Payer: Self-pay

## 2021-03-16 DIAGNOSIS — D3132 Benign neoplasm of left choroid: Secondary | ICD-10-CM | POA: Diagnosis not present

## 2021-03-16 DIAGNOSIS — H34811 Central retinal vein occlusion, right eye, with macular edema: Secondary | ICD-10-CM

## 2021-03-16 DIAGNOSIS — H43813 Vitreous degeneration, bilateral: Secondary | ICD-10-CM | POA: Diagnosis not present

## 2021-03-21 DIAGNOSIS — M8589 Other specified disorders of bone density and structure, multiple sites: Secondary | ICD-10-CM | POA: Diagnosis not present

## 2021-03-27 ENCOUNTER — Other Ambulatory Visit: Payer: Self-pay | Admitting: Internal Medicine

## 2021-03-27 DIAGNOSIS — Z1231 Encounter for screening mammogram for malignant neoplasm of breast: Secondary | ICD-10-CM

## 2021-03-30 ENCOUNTER — Ambulatory Visit
Admission: RE | Admit: 2021-03-30 | Discharge: 2021-03-30 | Disposition: A | Discharge status: Home / Self Care | Payer: Medicare Other | Source: Ambulatory Visit | Attending: Internal Medicine | Admitting: Internal Medicine

## 2021-03-30 ENCOUNTER — Ambulatory Visit (INDEPENDENT_AMBULATORY_CARE_PROVIDER_SITE_OTHER): Payer: Medicare Other | Admitting: Internal Medicine

## 2021-03-30 ENCOUNTER — Other Ambulatory Visit: Payer: Self-pay

## 2021-03-30 ENCOUNTER — Encounter: Payer: Self-pay | Admitting: Internal Medicine

## 2021-03-30 VITALS — BP 110/62 | HR 75 | Ht 65.0 in | Wt 128.4 lb

## 2021-03-30 DIAGNOSIS — I480 Paroxysmal atrial fibrillation: Secondary | ICD-10-CM

## 2021-03-30 DIAGNOSIS — Z1231 Encounter for screening mammogram for malignant neoplasm of breast: Secondary | ICD-10-CM

## 2021-03-30 NOTE — Progress Notes (Signed)
Patient Care Team: Shon Baton, MD as PCP - General (Internal Medicine) Deboraha Sprang, MD as PCP - Cardiology (Cardiology) Gaye Pollack, MD (Cardiothoracic Surgery) Shon Baton, MD as Consulting Physician (Internal Medicine)  HPI  [name] is a 85 y.o. female Seen in followup for chief complaint of palpitations  associated w dizziness and fatigue History of chest pain but none recently.  Stress echo demonstrated no echocardiographic changes; images were poor.   Event recorder>>atrial fibrillation. anticoagulation with apixoban, without bleeding   she missed a dose last week      Recently lost 25 lbs  Wt Readings from Last 3 Encounters:  03/30/21 128 lb 6.4 oz (58.2 kg)  09/23/20 133 lb (60.3 kg)  09/29/19 152 lb 3.2 oz (69 kg)    Her daughter was diagnosed with breast cancer over Thanksgiving. Fortunately, her daughter is being treated and doing well. The patient is doing well today. However, yesterday, she felt "crummy" with palpitations, fatigue, and dizziness. She attributes her episode yesterday to heightened stress. The patient denies chest pain, shortness of breath, nocturnal dyspnea, orthopnea or peripheral edema.  There has been no syncope. Complains of stress, palpitations, fatigue, and dizziness.  She describes being "out of rhythm" but that includes feeling queasy nauseated and lightheaded but without palpitations  DATE TEST EF   4/13 Echo   55-65 %   4/15 Echo  55-65%   6/21 Echo 40-45%   2/22 Echo 45-50%     Date Cr K Hgb  1/16 0.67  11.2  7/18 0.92  15.0  10/18   15.4   10/19 0.9 4.9 15.1  11/20 0.8 5.3 14.9  5/22 (CE) 0.8 5.0      Past Medical History:  Diagnosis Date   Allergic rhinitis    Arthritis    Atrial fibrillation (HCC)    BCC (basal cell carcinoma)    Benign tumor of pleura 2007   Bursitis    Bursitis of shoulder    Cataracts, bilateral    Complication of anesthesia    Cough since 05-09-2014   with sore throat   Diastolic  dysfunction    Fracture    at l1   Fracture    right arm x 2   Glaucoma (increased eye pressure)    Hearing loss    History of blood transfusion age 42 or 8   Hyperlipidemia    IBS (irritable bowel syndrome)    Insomnia    Lung tumor (benign)    OA (osteoarthritis)    Osteopenia    Paroxysmal atrial fibrillation (HCC)    PONV (postoperative nausea and vomiting) 48 yrs ago   with ether   Sarcoidosis    As a child   Seasonal allergies    Tortuous aortic arch     Past Surgical History:  Procedure Laterality Date   ABDOMINAL HYSTERECTOMY     complete   APPENDECTOMY     FRACTURE SURGERY Right 2007   Left video-assisted thoracoscopy, left thoracotomy with wedge resection of left lower lobe lung mass.  Insertion of On-Q pain pump  12/03/2005   [facility]   ROTATOR CUFF REPAIR Left 2008   TONSILLECTOMY  as child   TOTAL HIP ARTHROPLASTY Right 05/18/2014   Procedure: RIGHT TOTAL HIP ARTHROPLASTY ANTERIOR APPROACH;  Surgeon: [name], MD;  Location: [facility];  Service: Orthopedics;  Laterality: Right;   VAGINAL HYSTERECTOMY     WRIST SURGERY      Current  Outpatient Medications  Medication Sig Dispense Refill   acetaminophen (TYLENOL) 500 MG tablet Take 500 mg by mouth every 8 (eight) hours as needed for moderate pain (Takes 1-2 tabs prn knee pain).     Biotin 1 MG CAPS Take by mouth.     docusate sodium 100 MG CAPS Take 100 mg by mouth 2 (two) times daily. 10 capsule 0   ELIQUIS 2.5 MG TABS tablet TAKE ONE TABLET TWICE DAILY 180 tablet 1   fish oil-omega-3 fatty acids 1000 MG capsule Take 1 g by mouth daily.     fluticasone (FLONASE) 50 MCG/ACT nasal spray Place into both nostrils daily.     furosemide (LASIX) 20 MG tablet Take 20 mg by mouth as needed for fluid or edema.     Glucosamine-Chondroit-Vit C-Mn (GLUCOSAMINE 1500 COMPLEX PO) Take 1 capsule by mouth daily.     loratadine (CLARITIN) 10 MG tablet Take 10 mg by mouth daily as needed for allergies.     Misc Natural Products  (GLUCOSAMINE CHOND MSM FORMULA) TABS See admin instructions.     Saccharomyces boulardii (PROBIOTIC) 250 MG CAPS Take 1 capsule by mouth daily.     No current facility-administered medications for this visit.    Allergies  Allergen Reactions   Poison Ivy Extract     Severe hives, needed steroid taper last time    Review of Systems negative except from HPI and PMH  Physical Exam BP 110/62   Pulse 75   Ht 5\' 5"  (1.651 m)   Wt 128 lb 6.4 oz (58.2 kg)   SpO2 (!) 75%   BMI 21.37 kg/m  Well developed and nourished in no acute distress HENT normal Neck supple with JVP-  flat  Clear Irregularly Irregular rate and rhythm with controlled  ventricular response no murmurs or gallops Abd-soft with active BS No Clubbing cyanosis   trace edema Skin-warm and dry A & Oriented  Grossly normal sensory and motor function  ECG atrial fib @ 77 2112/42  5/22 >> NSR 6/21 >> NSR  Both Personally reviewed    Assessment and  Plan  Mitral valve prolapse  Persistent atrial fibrillation   HFpEF   PVC quiescient  LBBB  Persistent atrial fibrillation.  Not clearly associated with symptoms.  We will undertake cardioversion and reassess; unfortunately she missed one of her doses of apixaban last week and so we will schedule it shortly make in the right before right after Christmas as her schedule permits.  Continue apixaban 2.5 twice daily  There is very little rate variation in atrial fibrillation; however, there is some and QRS morphology is similar to sinus rhythm.  Doubt complete heart block.  Mild volume overload.  We will have her continue her furosemide 20 mg as needed.  PVC s quiescent   LH stable    I,Mykaella Javier,acting as a scribe for Virl Axe, MD.,have documented all relevant documentation on the behalf of Virl Axe, MD,as directed by  Virl Axe, MD while in the presence of Virl Axe, MD.  I, Virl Axe, MD, have reviewed all documentation for this visit.  The documentation on 03/30/21 for the exam, diagnosis, procedures, and orders are all accurate and complete.

## 2021-03-30 NOTE — Patient Instructions (Signed)
Medication Instructions:  Your physician recommends that you continue on your current medications as directed. Please refer to the Current Medication list given to you today.  *If you need a refill on your cardiac medications before your next appointment, please call your pharmacy*   Lab Work: CBC and BMET the morning of the cardioversion  If you have labs (blood work) drawn today and your tests are completely normal, you will receive your results only by: [place] (if you have MyChart) OR A paper copy in the mail If you have any lab test that is abnormal or we need to change your treatment, we will call you to review the results.   Testing/Procedures: Your physician has recommended that you have a Cardioversion (DCCV). Electrical Cardioversion uses a jolt of electricity to your heart either through paddles or wired patches attached to your chest. This is a controlled, usually prescheduled, procedure. Defibrillation is done under light anesthesia in the hospital, and you usually go home the day of the procedure. This is done to get your heart back into a normal rhythm. You are not awake for the procedure. Please see the instruction sheet given to you today.    Follow-Up: At [facility], you and your health needs are our priority.  As part of our continuing mission to provide you with exceptional heart care, we have created designated Provider Care Teams.  These Care Teams include your primary Cardiologist (physician) and Advanced Practice Providers (APPs -  Physician Assistants and Nurse Practitioners) who all work together to provide you with the care you need, when you need it.  We recommend signing up for the patient portal called "MyChart".  Sign up information is provided on this After Visit Summary.  MyChart is used to connect with patients for Virtual Visits (Telemedicine).  Patients are able to view lab/test results, encounter notes, upcoming appointments, etc.  Non-urgent  messages can be sent to your provider as well.   To learn more about what you can do with MyChart, go to [email or web address].    Your next appointment:   6 months with Dr Caryl Comes :1}    Other Instructions  You are scheduled for a TEE/Cardioversion/TEE Cardioversion on Friday, 04/21/2021 with Dr. Gasper Sells.  Please arrive at the [facility] (Main Entrance A) at [facility]: [place], [place] [address] at 8am.  DIET: Nothing to eat or drink after midnight except a sip of water with medications (see medication instructions below)  FYI: For your safety, and to allow [place] to monitor your vital signs accurately during the surgery/procedure we request that   if you have artificial nails, gel coating, SNS etc. Please have those removed prior to your surgery/procedure. Not having the nail coverings /polish removed may result in cancellation or delay of your surgery/procedure.   Medication Instructions: Hold all medications that morning except your ELiquis  Continue your anticoagulant: Eliquis You will need to continue your anticoagulant after your procedure until you  are told by your  Provider that it is safe to stop   Labs:  You will have labs the morning of your procedure  You must have a responsible person to drive you home and stay in the waiting area during your procedure. Failure to do so could result in cancellation.  Bring your insurance cards.  *Special Note: Every effort is made to have your procedure done on time. Occasionally there are emergencies that occur at the hospital that may cause delays. Please be patient if  a delay does occur.

## 2021-04-10 ENCOUNTER — Encounter (HOSPITAL_COMMUNITY): Payer: Self-pay | Admitting: Internal Medicine

## 2021-04-13 ENCOUNTER — Other Ambulatory Visit: Payer: Self-pay

## 2021-04-13 ENCOUNTER — Encounter (INDEPENDENT_AMBULATORY_CARE_PROVIDER_SITE_OTHER): Payer: Medicare Other | Admitting: Ophthalmology

## 2021-04-13 DIAGNOSIS — H34811 Central retinal vein occlusion, right eye, with macular edema: Secondary | ICD-10-CM

## 2021-04-13 DIAGNOSIS — D3132 Benign neoplasm of left choroid: Secondary | ICD-10-CM | POA: Diagnosis not present

## 2021-04-13 DIAGNOSIS — H43813 Vitreous degeneration, bilateral: Secondary | ICD-10-CM

## 2021-04-20 ENCOUNTER — Encounter (HOSPITAL_COMMUNITY): Payer: Self-pay | Admitting: Certified Registered Nurse Anesthetist

## 2021-04-21 ENCOUNTER — Encounter (HOSPITAL_COMMUNITY): Payer: Self-pay | Admitting: Internal Medicine

## 2021-04-21 ENCOUNTER — Ambulatory Visit (HOSPITAL_COMMUNITY)
Admission: RE | Admit: 2021-04-21 | Discharge: 2021-04-21 | Disposition: A | Discharge status: Home / Self Care | Payer: Medicare Other | Attending: Internal Medicine | Admitting: Internal Medicine

## 2021-04-21 ENCOUNTER — Encounter (HOSPITAL_COMMUNITY)
Admission: RE | Disposition: A | Discharge status: Home / Self Care | Payer: Self-pay | Source: Home / Self Care | Attending: Internal Medicine

## 2021-04-21 DIAGNOSIS — Z538 Procedure and treatment not carried out for other reasons: Secondary | ICD-10-CM | POA: Insufficient documentation

## 2021-04-21 DIAGNOSIS — I4891 Unspecified atrial fibrillation: Secondary | ICD-10-CM | POA: Diagnosis not present

## 2021-04-21 DIAGNOSIS — I480 Paroxysmal atrial fibrillation: Secondary | ICD-10-CM

## 2021-04-21 SURGERY — CANCELLED PROCEDURE

## 2021-04-21 MED ORDER — SODIUM CHLORIDE 0.9 % IV SOLN: INTRAVENOUS | Status: DC

## 2021-04-21 NOTE — H&P (Signed)
Patient in sinus rhythm prior to DCCV.  Procedure cancelled.

## 2021-04-21 NOTE — Progress Notes (Signed)
Pt found to be in NSR on arrival for cardioversion.  Verified with EKG.  Dr. Gasper Sells aware.  Pt discharged home with daughter.  Vista Lawman, RN

## 2021-05-11 ENCOUNTER — Other Ambulatory Visit: Payer: Self-pay

## 2021-05-11 ENCOUNTER — Encounter (INDEPENDENT_AMBULATORY_CARE_PROVIDER_SITE_OTHER): Payer: Medicare Other | Admitting: Ophthalmology

## 2021-05-11 DIAGNOSIS — H43813 Vitreous degeneration, bilateral: Secondary | ICD-10-CM | POA: Diagnosis not present

## 2021-05-11 DIAGNOSIS — H348112 Central retinal vein occlusion, right eye, stable: Secondary | ICD-10-CM

## 2021-05-11 DIAGNOSIS — D3132 Benign neoplasm of left choroid: Secondary | ICD-10-CM

## 2021-05-22 ENCOUNTER — Ambulatory Visit (INDEPENDENT_AMBULATORY_CARE_PROVIDER_SITE_OTHER): Payer: Medicare Other

## 2021-05-22 ENCOUNTER — Ambulatory Visit (INDEPENDENT_AMBULATORY_CARE_PROVIDER_SITE_OTHER): Payer: Medicare Other | Admitting: Internal Medicine

## 2021-05-22 ENCOUNTER — Encounter: Payer: Self-pay | Admitting: Internal Medicine

## 2021-05-22 ENCOUNTER — Other Ambulatory Visit: Payer: Self-pay

## 2021-05-22 VITALS — BP 108/62 | HR 88 | Ht 65.0 in | Wt 128.4 lb

## 2021-05-22 DIAGNOSIS — I480 Paroxysmal atrial fibrillation: Secondary | ICD-10-CM

## 2021-05-22 DIAGNOSIS — R001 Bradycardia, unspecified: Secondary | ICD-10-CM | POA: Diagnosis not present

## 2021-05-22 MED ORDER — FUROSEMIDE 20 MG PO TABS: ORAL_TABLET | ORAL | 0 refills | Status: DC

## 2021-05-22 NOTE — Progress Notes (Signed)
Patient Care Team: Shon Baton, MD as PCP - General (Internal Medicine) Deboraha Sprang, MD as PCP - Cardiology (Cardiology) Gaye Pollack, MD (Cardiothoracic Surgery) Shon Baton, MD as Consulting Physician (Internal Medicine)  HPI  [name] is a 86 y.o. female Seen in followup for chief complaint of palpitations  associated w dizziness and fatigue History of chest pain but none recently.  Stress echo demonstrated no echocardiographic changes; images were poor.   Event recorder>>atrial fibrillation. anticoagulation with apixoban, without bleeding   she missed a dose last week   Was to undergo cardioversion.  Appeared 12/22 in sinus rhythm.  She did not appreciate a difference in her symptoms from when I saw her in A. fib and when she appeared at hospital in atrial fibrillation.  While she has complaints suggestive of atrial fibrillation i.e. palpitations and tachy palpitations, she is unaware for example that she is in atrial fibrillation today and describes most of her days is feeling about the same.  No chest pain or edema.   Wt Readings from Last 3 Encounters:  05/22/21 128 lb 6.4 oz (58.2 kg)  03/30/21 128 lb 6.4 oz (58.2 kg)  09/23/20 133 lb (60.3 kg)       DATE TEST EF   4/13 Echo   55-65 %   4/15 Echo  55-65%   6/21 Echo 40-45%   2/22 Echo 45-50%     Date Cr K Hgb  1/16 0.67  11.2  7/18 0.92  15.0  10/18   15.4   10/19 0.9 4.9 15.1  11/20 0.8 5.3 14.9  5/22 (CE) 0.8 5.0           Past Medical History:  Diagnosis Date   Allergic rhinitis    Arthritis    Atrial fibrillation (HCC)    BCC (basal cell carcinoma)    Benign tumor of pleura 2007   Bursitis    Bursitis of shoulder    Cataracts, bilateral    Complication of anesthesia    Cough since 05-09-2014   with sore throat   Diastolic dysfunction    Fracture    at l1   Fracture    right arm x 2   Glaucoma (increased eye pressure)    Hearing loss    History of blood transfusion age 4 or  8   Hyperlipidemia    IBS (irritable bowel syndrome)    Insomnia    Lung tumor (benign)    OA (osteoarthritis)    Osteopenia    Paroxysmal atrial fibrillation (HCC)    PONV (postoperative nausea and vomiting) 48 yrs ago   with ether   Sarcoidosis    As a child   Seasonal allergies    Tortuous aortic arch     Past Surgical History:  Procedure Laterality Date   ABDOMINAL HYSTERECTOMY     complete   APPENDECTOMY     FRACTURE SURGERY Right 2007   Left video-assisted thoracoscopy, left thoracotomy with wedge resection of left lower lobe lung mass.  Insertion of On-Q pain pump  12/03/2005   [facility]   ROTATOR CUFF REPAIR Left 2008   TONSILLECTOMY  as child   TOTAL HIP ARTHROPLASTY Right 05/18/2014   Procedure: RIGHT TOTAL HIP ARTHROPLASTY ANTERIOR APPROACH;  Surgeon: [name], MD;  Location: [facility];  Service: Orthopedics;  Laterality: Right;   VAGINAL HYSTERECTOMY     WRIST SURGERY      Current Outpatient Medications  Medication Sig Dispense  Refill   acetaminophen (TYLENOL) 500 MG tablet Take 500-1,000 mg by mouth every 6 (six) hours as needed for moderate pain.     Biotin 5000 MCG TABS Take 5,000 mcg by mouth daily.     Calcium Carb-Cholecalciferol (CALTRATE 600+D3 PO) Take 1 tablet by mouth daily.     cholecalciferol (VITAMIN D3) 25 MCG (1000 UNIT) tablet Take 1,000 Units by mouth daily.     docusate sodium 100 MG CAPS Take 100 mg by mouth 2 (two) times daily. 10 capsule 0   ELIQUIS 2.5 MG TABS tablet TAKE ONE TABLET TWICE DAILY 180 tablet 1   fluticasone (FLONASE) 50 MCG/ACT nasal spray Place 1 spray into both nostrils daily as needed for allergies.     loratadine (CLARITIN) 10 MG tablet Take 10 mg by mouth daily as needed for allergies.     Misc Natural Products (GLUCOSAMINE CHOND MSM FORMULA) TABS Take 1 capsule by mouth daily.     Omega-3 Fatty Acids (FISH OIL) 1200 MG CAPS Take 1,200 mg by mouth daily.     sodium chloride (OCEAN) 0.65 % SOLN nasal spray Place 1 spray  into both nostrils as needed for congestion.     alendronate (FOSAMAX) 70 MG tablet Take 70 mg by mouth once a week. (Patient not taking: Reported on 05/22/2021)     No current facility-administered medications for this visit.    Allergies  Allergen Reactions   Alendronate Swelling   Poison Ivy Extract     Severe hives, needed steroid taper last time   Trazodone And Nefazodone     Nightmares    Review of Systems negative except from HPI and PMH  Physical Exam BP 108/62    Pulse 88    Ht 5\' 5"  (1.651 m)    Wt 128 lb 6.4 oz (58.2 kg)    SpO2 96%    BMI 21.37 kg/m  Well developed and nourished in no acute distress HENT normal Neck supple Clear Irregularly Irregular rate and rhythm wit controlled  ventricular response  Abd-soft with active BS No Clubbing cyanosis tr edema Skin-warm and dry A & Oriented  Grossly normal sensory and motor function  ECG atrial fibrillation at 88  A & Oriented  Grossly normal sensory and motor function 1/23   12/22 >>NSR 12/22  AFIB 5/22 >> NSR 6/21 >> NSR  Both Personally reviewed    Assessment and  Plan  Mitral valve prolapse  Persistent atrial fibrillation   HFpEF   PVC quiescient  LBBB    The patient has recurrent atrial fibrillation.  It remains unclear to me as to whether she has symptoms attributable to it or not.  She does have some tachy palpitations and so we will undertake a Zio patch x14 days to try to clarify the burden of atrial fibrillation and his heart rate excursion.  Her blood pressure is low but she might benefit from low-dose digoxin for rate control.  No bleeding.  We will continue her on Eliquis 2.5.  I called and spoke with her daughter, Dr. Sundquist was at [facility].  She voiced understanding of the issues related to 1) are there attributable symptoms to the presence or absence of atrial fibrillation and 2) to her symptoms perhaps related to atrial fibrillation with less rapid and more rapid rates.

## 2021-05-22 NOTE — Progress Notes (Unsigned)
Enrolled for [employer] to mail a ZIO XT long term holter monitor to the patients address on file.  

## 2021-05-22 NOTE — Patient Instructions (Addendum)
Medication Instructions:  Your physician has recommended you make the following change in your medication:   ** Take Furosemide 20mg  -1 tablet by mouth every other day x 1 week.   *If you need a refill on your cardiac medications before your next appointment, please call your pharmacy*   Lab Work: None ordered.  If you have labs (blood work) drawn today and your tests are completely normal, you will receive your results only by: [place] (if you have MyChart) OR A paper copy in the mail If you have any lab test that is abnormal or we need to change your treatment, we will call you to review the results.   Testing/Procedures: [place]- Long Term Monitor Instructions  Your physician has requested you wear a ZIO patch monitor for 14 days.  This is a single patch monitor. [employer] supplies one patch monitor per enrollment. Additional stickers are not available. Please do not apply patch if you will be having a Nuclear Stress Test,  Echocardiogram, Cardiac CT, MRI, or Chest Xray during the period you would be wearing the  monitor. The patch cannot be worn during these tests. You cannot remove and re-apply the  ZIO XT patch monitor.  Your ZIO patch monitor will be mailed 3 day USPS to your address on file. It may take 3-5 days  to receive your monitor after you have been enrolled.  Once you have received your monitor, please review the enclosed instructions. Your monitor  has already been registered assigning a specific monitor serial # to you.  Billing and Patient Assistance Program Information  We have supplied [employer] with any of your insurance information on file for billing purposes. [employer] offers a sliding scale Patient Assistance Program for patients that do not have  insurance, or whose insurance does not completely cover the cost of the ZIO monitor.  You must apply for the Patient Assistance Program to qualify for this discounted rate.  To apply, please call [employer] at  [phone], select option 4, select option 2, ask to apply for  Patient Assistance Program. Theodore Demark will ask your household income, and how many people  are in your household. They will quote your out-of-pocket cost based on that information.  [employer] will also be able to set up a 79-month, interest-free payment plan if needed.  Applying the monitor   Shave hair from upper left chest.  Hold abrader disc by orange tab. Rub abrader in 40 strokes over the upper left chest as  indicated in your monitor instructions.  Clean area with 4 enclosed alcohol pads. Let dry.  Apply patch as indicated in monitor instructions. Patch will be placed under collarbone on left  side of chest with arrow pointing upward.  Rub patch adhesive wings for 2 minutes. Remove white label marked "1". Remove the white  label marked "2". Rub patch adhesive wings for 2 additional minutes.  While looking in a mirror, press and release button in center of patch. A small green light will  flash 3-4 times. This will be your only indicator that the monitor has been turned on.  Do not shower for the first 24 hours. You may shower after the first 24 hours.  Press the button if you feel a symptom. You will hear a small click. Record Date, Time and  Symptom in the Patient Logbook.  When you are ready to remove the patch, follow instructions on the last 2 pages of Patient  Logbook. Stick patch monitor onto the last page  of Patient Logbook.  Place Patient Logbook in the blue and white box. Use locking tab on box and tape box closed  securely. The blue and white box has prepaid postage on it. Please place it in the mailbox as  soon as possible. Your physician should have your test results approximately 7 days after the  monitor has been mailed back to [facility].  Call [place] at [phone] if you have questions regarding  your ZIO XT patch monitor. Call them immediately if you see an orange light  blinking on your  monitor.  If your monitor falls off in less than 4 days, contact our Monitor department at [phone].  If your monitor becomes loose or falls off after 4 days call [employer] at [phone] for  suggestions on securing your monitor    Follow-Up: At [facility], you and your health needs are our priority.  As part of our continuing mission to provide you with exceptional heart care, we have created designated Provider Care Teams.  These Care Teams include your primary Cardiologist (physician) and Advanced Practice Providers (APPs -  Physician Assistants and Nurse Practitioners) who all work together to provide you with the care you need, when you need it.  We recommend signing up for the patient portal called "MyChart".  Sign up information is provided on this After Visit Summary.  MyChart is used to connect with patients for Virtual Visits (Telemedicine).  Patients are able to view lab/test results, encounter notes, upcoming appointments, etc.  Non-urgent messages can be sent to your provider as well.   To learn more about what you can do with MyChart, go to [email or web address].    Your next appointment:   Follow up to be determined

## 2021-05-23 ENCOUNTER — Other Ambulatory Visit: Payer: Self-pay | Admitting: Surgery

## 2021-05-23 DIAGNOSIS — D190 Benign neoplasm of mesothelial tissue of pleura: Secondary | ICD-10-CM

## 2021-05-24 ENCOUNTER — Ambulatory Visit (INDEPENDENT_AMBULATORY_CARE_PROVIDER_SITE_OTHER): Payer: Medicare Other | Admitting: Surgery

## 2021-05-24 ENCOUNTER — Ambulatory Visit
Admission: RE | Admit: 2021-05-24 | Discharge: 2021-05-24 | Disposition: A | Discharge status: Home / Self Care | Payer: Medicare Other | Source: Ambulatory Visit | Attending: Surgery | Admitting: Surgery

## 2021-05-24 ENCOUNTER — Other Ambulatory Visit: Payer: Self-pay

## 2021-05-24 ENCOUNTER — Encounter: Payer: Self-pay | Admitting: Surgery

## 2021-05-24 VITALS — BP 115/68 | HR 82 | Resp 20 | Ht 65.0 in

## 2021-05-24 DIAGNOSIS — Z86018 Personal history of other benign neoplasm: Secondary | ICD-10-CM | POA: Diagnosis not present

## 2021-05-24 DIAGNOSIS — J449 Chronic obstructive pulmonary disease, unspecified: Secondary | ICD-10-CM | POA: Diagnosis not present

## 2021-05-24 DIAGNOSIS — D190 Benign neoplasm of mesothelial tissue of pleura: Secondary | ICD-10-CM

## 2021-05-26 ENCOUNTER — Ambulatory Visit: Payer: Medicare Other | Admitting: Internal Medicine

## 2021-05-26 DIAGNOSIS — I480 Paroxysmal atrial fibrillation: Secondary | ICD-10-CM | POA: Diagnosis not present

## 2021-05-26 DIAGNOSIS — R001 Bradycardia, unspecified: Secondary | ICD-10-CM | POA: Diagnosis not present

## 2021-05-28 NOTE — Progress Notes (Signed)
HPI:  The patient returns to my office today for followup status post left thoracotomy and wedge resection of a left lower lobe lung mass on 12/03/2005. 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 She has a history of paroxysmal atrial fibrillation and is on Eliquis.  She denies any chest pain or shortness of breath.  Current Outpatient Medications  Medication Sig Dispense Refill   acetaminophen (TYLENOL) 500 MG tablet Take 500-1,000 mg by mouth every 6 (six) hours as needed for moderate pain.     Biotin 5000 MCG TABS Take 5,000 mcg by mouth daily.     Calcium Carb-Cholecalciferol (CALTRATE 600+D3 PO) Take 1 tablet by mouth daily.     cholecalciferol (VITAMIN D3) 25 MCG (1000 UNIT) tablet Take 1,000 Units by mouth daily.     docusate sodium 100 MG CAPS Take 100 mg by mouth 2 (two) times daily. 10 capsule 0   ELIQUIS 2.5 MG TABS tablet TAKE ONE TABLET TWICE DAILY 180 tablet 1   fluticasone (FLONASE) 50 MCG/ACT nasal spray Place 1 spray into both nostrils daily as needed for allergies.     furosemide (LASIX) 20 MG tablet Take one tablet by mouth every other day x 1 week 5 tablet 0   loratadine (CLARITIN) 10 MG tablet Take 10 mg by mouth daily as needed for allergies.     Misc Natural Products (GLUCOSAMINE CHOND MSM FORMULA) TABS Take 1 capsule by mouth daily.     Omega-3 Fatty Acids (FISH OIL) 1200 MG CAPS Take 1,200 mg by mouth daily.     sodium chloride (OCEAN) 0.65 % SOLN nasal spray Place 1 spray into both nostrils as needed for congestion.     alendronate (FOSAMAX) 70 MG tablet Take 70 mg by mouth once a week. (Patient not taking: Reported on 05/24/2021)     No current facility-administered medications for this visit.     Physical Exam: BP 115/68 (BP Location: Right Arm, Patient Position: Sitting)    Pulse 82    Resp 20    Ht 5\' 5"  (1.651 m)    SpO2 95% Comment: RA   BMI 21.37 kg/m  She looks well. There is no cervical or supraclavicular adenopathy. Lungs are  clear. Cardiac exam shows a regular rate and rhythm with normal heart sounds.  Diagnostic Tests:  Narrative & Impression  CLINICAL DATA:  History of pleural nodule   EXAM: CHEST - 2 VIEW   COMPARISON:  Previous studies including the examination of 05/25/2020   FINDINGS: Cardiac size is within normal limits. Thoracic aorta is tortuous. Increase in AP diameter of chest and flattening of diaphragms suggests COPD. There is faint increased density in the medial right lower lung fields with no significant interval change. This finding could not be localized in the lateral view and may suggest pleural thickening. No new focal pulmonary infiltrates are seen. There are no discrete lung nodules. There is no pleural effusion or pneumothorax.   IMPRESSION: COPD.  There are no new infiltrates or signs of pulmonary edema.     Electronically Signed   By: Elmer Picker M.D.   On: 05/24/2021 14:01      Impression:  There is no abnormality seen on chest x-ray today.  I reviewed the film with her and answered her questions.  Plan:  She will return in 1 year with a chest x-ray.  I spent 10 minutes performing this established patient evaluation and > 50% of this time was spent face to face  counseling and coordinating the surveillance of her previously resected pleural-based tumor.   Gaye Pollack, MD [facility] [phone]

## 2021-06-09 ENCOUNTER — Encounter (INDEPENDENT_AMBULATORY_CARE_PROVIDER_SITE_OTHER): Payer: Medicare Other | Admitting: Ophthalmology

## 2021-06-09 ENCOUNTER — Other Ambulatory Visit: Payer: Self-pay

## 2021-06-09 DIAGNOSIS — H348122 Central retinal vein occlusion, left eye, stable: Secondary | ICD-10-CM

## 2021-06-09 DIAGNOSIS — H43813 Vitreous degeneration, bilateral: Secondary | ICD-10-CM | POA: Diagnosis not present

## 2021-06-09 DIAGNOSIS — D3132 Benign neoplasm of left choroid: Secondary | ICD-10-CM

## 2021-06-13 DIAGNOSIS — I480 Paroxysmal atrial fibrillation: Secondary | ICD-10-CM | POA: Diagnosis not present

## 2021-06-13 DIAGNOSIS — R001 Bradycardia, unspecified: Secondary | ICD-10-CM | POA: Diagnosis not present

## 2021-06-27 ENCOUNTER — Telehealth: Payer: Self-pay

## 2021-06-27 NOTE — Telephone Encounter (Signed)
Spoke with pt and advised of Dr Olin Pia monitor results and recommendations as below.  Pt verbalizes understanding and thanked Therapist, sports for the call.  Findings HR  avg 77  Min 54-Max 119  PVCs Rare, less than 1%  PACs Rare, less than 1%   VT Nonsustained 1 episodes; fastest 231 bpm for 7 beats  SVT Nonsustained 88 episodes; fastest 162 bpm for 9 beats; longest for 13.8 secs @ 134 bpm    Symptoms:            LH>> sinus rhythm            Pounding >> normal sinus rhythm             SOB  Normal sinus rhythm    Triggered: artifact            SVT nonsustained 7 beats   Conclusions: Freq mostly asymptomatic ( ie not noted by the patient) short runs of atrial tachycardia Most of her symptoms were associated with normal rhythm     Recommendations None

## 2021-07-07 ENCOUNTER — Other Ambulatory Visit: Payer: Self-pay

## 2021-07-07 ENCOUNTER — Encounter (INDEPENDENT_AMBULATORY_CARE_PROVIDER_SITE_OTHER): Payer: Medicare Other | Admitting: Ophthalmology

## 2021-07-07 DIAGNOSIS — D3132 Benign neoplasm of left choroid: Secondary | ICD-10-CM | POA: Diagnosis not present

## 2021-07-07 DIAGNOSIS — H34811 Central retinal vein occlusion, right eye, with macular edema: Secondary | ICD-10-CM | POA: Diagnosis not present

## 2021-07-07 DIAGNOSIS — H43813 Vitreous degeneration, bilateral: Secondary | ICD-10-CM

## 2021-08-04 ENCOUNTER — Encounter (INDEPENDENT_AMBULATORY_CARE_PROVIDER_SITE_OTHER): Payer: Medicare Other | Admitting: Ophthalmology

## 2021-08-04 DIAGNOSIS — D3132 Benign neoplasm of left choroid: Secondary | ICD-10-CM | POA: Diagnosis not present

## 2021-08-04 DIAGNOSIS — H348112 Central retinal vein occlusion, right eye, stable: Secondary | ICD-10-CM

## 2021-08-04 DIAGNOSIS — H43813 Vitreous degeneration, bilateral: Secondary | ICD-10-CM

## 2021-08-24 DIAGNOSIS — H401212 Low-tension glaucoma, right eye, moderate stage: Secondary | ICD-10-CM | POA: Diagnosis not present

## 2021-08-24 DIAGNOSIS — H401221 Low-tension glaucoma, left eye, mild stage: Secondary | ICD-10-CM | POA: Diagnosis not present

## 2021-09-07 ENCOUNTER — Other Ambulatory Visit: Payer: Self-pay | Admitting: Internal Medicine

## 2021-09-07 ENCOUNTER — Encounter (INDEPENDENT_AMBULATORY_CARE_PROVIDER_SITE_OTHER): Payer: Medicare Other | Admitting: Ophthalmology

## 2021-09-07 NOTE — Telephone Encounter (Addendum)
Prescription refill request for Eliquis received. ?Indication: PAF ?Last office visit: 05/22/21  Olin Pia MD ?Scr: 0.8 on 08/30/20 ?Age: 86 ?Weight: 58.2kg ? ?Based on above findings Eliquis 2.'5mg'$  twice daily is the appropriate dose.  Pt is in recall to see Dr Caryl Comes in May.  Message sent to schedulers to make appt.  Pt will need. CBC/BMP at that time.  Put in appt notes.  Refill approved. ? ? ?

## 2021-09-08 ENCOUNTER — Encounter (INDEPENDENT_AMBULATORY_CARE_PROVIDER_SITE_OTHER): Payer: Medicare Other | Admitting: Ophthalmology

## 2021-09-08 DIAGNOSIS — D3132 Benign neoplasm of left choroid: Secondary | ICD-10-CM | POA: Diagnosis not present

## 2021-09-08 DIAGNOSIS — H43813 Vitreous degeneration, bilateral: Secondary | ICD-10-CM | POA: Diagnosis not present

## 2021-09-08 DIAGNOSIS — H348112 Central retinal vein occlusion, right eye, stable: Secondary | ICD-10-CM

## 2021-09-15 DIAGNOSIS — D692 Other nonthrombocytopenic purpura: Secondary | ICD-10-CM | POA: Diagnosis not present

## 2021-09-15 DIAGNOSIS — M1990 Unspecified osteoarthritis, unspecified site: Secondary | ICD-10-CM | POA: Diagnosis not present

## 2021-09-15 DIAGNOSIS — I700 Atherosclerosis of aorta: Secondary | ICD-10-CM | POA: Diagnosis not present

## 2021-09-15 DIAGNOSIS — I5042 Chronic combined systolic (congestive) and diastolic (congestive) heart failure: Secondary | ICD-10-CM | POA: Diagnosis not present

## 2021-09-15 DIAGNOSIS — M8589 Other specified disorders of bone density and structure, multiple sites: Secondary | ICD-10-CM | POA: Diagnosis not present

## 2021-09-15 DIAGNOSIS — G4762 Sleep related leg cramps: Secondary | ICD-10-CM | POA: Diagnosis not present

## 2021-09-15 DIAGNOSIS — K5900 Constipation, unspecified: Secondary | ICD-10-CM | POA: Diagnosis not present

## 2021-09-15 DIAGNOSIS — E785 Hyperlipidemia, unspecified: Secondary | ICD-10-CM | POA: Diagnosis not present

## 2021-09-15 DIAGNOSIS — I480 Paroxysmal atrial fibrillation: Secondary | ICD-10-CM | POA: Diagnosis not present

## 2021-09-15 DIAGNOSIS — R251 Tremor, unspecified: Secondary | ICD-10-CM | POA: Diagnosis not present

## 2021-09-15 DIAGNOSIS — Z7901 Long term (current) use of anticoagulants: Secondary | ICD-10-CM | POA: Diagnosis not present

## 2021-09-15 DIAGNOSIS — G4700 Insomnia, unspecified: Secondary | ICD-10-CM | POA: Diagnosis not present

## 2021-09-19 DIAGNOSIS — M25511 Pain in right shoulder: Secondary | ICD-10-CM | POA: Diagnosis not present

## 2021-10-10 ENCOUNTER — Other Ambulatory Visit (HOSPITAL_COMMUNITY): Payer: Self-pay | Admitting: *Deleted

## 2021-10-11 ENCOUNTER — Encounter (INDEPENDENT_AMBULATORY_CARE_PROVIDER_SITE_OTHER): Payer: Medicare Other | Admitting: Ophthalmology

## 2021-10-11 ENCOUNTER — Ambulatory Visit (HOSPITAL_COMMUNITY)
Admission: RE | Admit: 2021-10-11 | Discharge: 2021-10-11 | Disposition: A | Discharge status: Home / Self Care | Payer: Medicare Other | Source: Ambulatory Visit | Attending: Internal Medicine | Admitting: Internal Medicine

## 2021-10-11 DIAGNOSIS — H348112 Central retinal vein occlusion, right eye, stable: Secondary | ICD-10-CM | POA: Diagnosis not present

## 2021-10-11 DIAGNOSIS — M810 Age-related osteoporosis without current pathological fracture: Secondary | ICD-10-CM | POA: Diagnosis not present

## 2021-10-11 DIAGNOSIS — D3132 Benign neoplasm of left choroid: Secondary | ICD-10-CM

## 2021-10-11 DIAGNOSIS — H43813 Vitreous degeneration, bilateral: Secondary | ICD-10-CM

## 2021-10-11 MED ORDER — DENOSUMAB 60 MG/ML ~~LOC~~ SOSY
60.0000 mg | PREFILLED_SYRINGE | Freq: Once | SUBCUTANEOUS | Status: AC
  Administered 2021-10-11: 60 mg via SUBCUTANEOUS

## 2021-10-11 MED ORDER — DENOSUMAB 60 MG/ML ~~LOC~~ SOSY
PREFILLED_SYRINGE | SUBCUTANEOUS | Status: AC
  Filled 2021-10-11: qty 1

## 2021-10-27 DIAGNOSIS — M25562 Pain in left knee: Secondary | ICD-10-CM | POA: Diagnosis not present

## 2021-10-27 DIAGNOSIS — M1712 Unilateral primary osteoarthritis, left knee: Secondary | ICD-10-CM | POA: Diagnosis not present

## 2021-10-30 DIAGNOSIS — I5030 Unspecified diastolic (congestive) heart failure: Secondary | ICD-10-CM | POA: Insufficient documentation

## 2021-10-30 DIAGNOSIS — I447 Left bundle-branch block, unspecified: Secondary | ICD-10-CM | POA: Insufficient documentation

## 2021-11-02 ENCOUNTER — Ambulatory Visit (INDEPENDENT_AMBULATORY_CARE_PROVIDER_SITE_OTHER): Payer: Medicare Other | Admitting: Internal Medicine

## 2021-11-02 ENCOUNTER — Encounter: Payer: Self-pay | Admitting: Internal Medicine

## 2021-11-02 VITALS — BP 112/70 | HR 68 | Ht 65.0 in | Wt 127.6 lb

## 2021-11-02 DIAGNOSIS — G259 Extrapyramidal and movement disorder, unspecified: Secondary | ICD-10-CM | POA: Diagnosis not present

## 2021-11-02 DIAGNOSIS — I4819 Other persistent atrial fibrillation: Secondary | ICD-10-CM

## 2021-11-02 DIAGNOSIS — I447 Left bundle-branch block, unspecified: Secondary | ICD-10-CM

## 2021-11-02 DIAGNOSIS — I5030 Unspecified diastolic (congestive) heart failure: Secondary | ICD-10-CM

## 2021-11-02 MED ORDER — FUROSEMIDE 20 MG PO TABS: ORAL_TABLET | ORAL | 0 refills | Status: DC

## 2021-11-02 NOTE — Progress Notes (Signed)
Patient Care Team: Shon Baton, MD as PCP - General (Internal Medicine) Deboraha Sprang, MD as PCP - Cardiology (Cardiology) Gaye Pollack, MD (Cardiothoracic Surgery) Shon Baton, MD as Consulting Physician (Internal Medicine)  HPI  [name] is a 86 y.o. female Seen in followup for chief complaint of palpitations  associated w dizziness and fatigue History of chest pain but none recently.  Stress echo demonstrated no echocardiographic changes; images were poor.   Event recorder>>atrial fibrillation. anticoagulation with apixoban,    Was to undergo cardioversion.  Appeared 12/22 in sinus rhythm.  Multiple efforts of been undertaken to try to discern whether symptoms are associated with her paroxysms of atrial fibrillation, whether they are associated with variable rates in atrial fibrillation or unrelated to her A-fib at all.  There is no significant improvement with cardioversion, there been no significant awareness of atrial fibrillation at her last visit 1/23.  Has had episodic dyspnea.  Felt really crummy earlier this week with a sensation that her heart was out of rhythm.  Still somewhat today.  (In sinus today)  Also with shortness of breath.  Has developed a tremor in her right hand, concerning for Parkinson's in her mind.  Anxious about her husband's deteriorating condition as well and who will outlive whom.    Wt Readings from Last 3 Encounters:  11/02/21 127 lb 9.6 oz (57.9 kg)  05/22/21 128 lb 6.4 oz (58.2 kg)  03/30/21 128 lb 6.4 oz (58.2 kg)       DATE TEST EF   4/13 Echo   55-65 %   4/15 Echo  55-65%   6/21 Echo 40-45%   2/22 Echo 45-50%     Date Cr K Hgb  1/16 0.67  11.2  7/18 0.92  15.0  10/18   15.4   10/19 0.9 4.9 15.1  11/20 0.8 5.3 14.9  5/22 (CE) 0.8 5.0           Past Medical History:  Diagnosis Date   Allergic rhinitis    Arthritis    Atrial fibrillation (HCC)    BCC (basal cell carcinoma)    Benign tumor of pleura 2007    Bursitis    Bursitis of shoulder    Cataracts, bilateral    Complication of anesthesia    Cough since 05-09-2014   with sore throat   Diastolic dysfunction    Fracture    at l1   Fracture    right arm x 2   Glaucoma (increased eye pressure)    Hearing loss    History of blood transfusion age 17 or 8   Hyperlipidemia    IBS (irritable bowel syndrome)    Insomnia    Lung tumor (benign)    OA (osteoarthritis)    Osteopenia    Paroxysmal atrial fibrillation (HCC)    PONV (postoperative nausea and vomiting) 48 yrs ago   with ether   Sarcoidosis    As a child   Seasonal allergies    Tortuous aortic arch     Past Surgical History:  Procedure Laterality Date   ABDOMINAL HYSTERECTOMY     complete   APPENDECTOMY     FRACTURE SURGERY Right 2007   Left video-assisted thoracoscopy, left thoracotomy with wedge resection of left lower lobe lung mass.  Insertion of On-Q pain pump  12/03/2005   [facility]   ROTATOR CUFF REPAIR Left 2008   TONSILLECTOMY  as child   TOTAL HIP ARTHROPLASTY Right 05/18/2014  Procedure: RIGHT TOTAL HIP ARTHROPLASTY ANTERIOR APPROACH;  Surgeon: [name], MD;  Location: [facility];  Service: Orthopedics;  Laterality: Right;   VAGINAL HYSTERECTOMY     WRIST SURGERY      Current Outpatient Medications  Medication Sig Dispense Refill   acetaminophen (TYLENOL) 500 MG tablet Take 500-1,000 mg by mouth every 6 (six) hours as needed for moderate pain.     apixaban (ELIQUIS) 2.5 MG TABS tablet TAKE ONE TABLET TWICE DAILY 180 tablet 0   Biotin 5000 MCG TABS Take 5,000 mcg by mouth daily.     Calcium Carb-Cholecalciferol (CALTRATE 600+D3 PO) Take 1 tablet by mouth daily.     cholecalciferol (VITAMIN D3) 25 MCG (1000 UNIT) tablet Take 1,000 Units by mouth daily.     denosumab (PROLIA) 60 MG/ML SOSY injection Inject 60 mg into the skin every 6 (six) months.     docusate sodium 100 MG CAPS Take 100 mg by mouth 2 (two) times daily. 10 capsule 0   fluticasone (FLONASE) 50  MCG/ACT nasal spray Place 1 spray into both nostrils daily as needed for allergies.     Misc Natural Products (GLUCOSAMINE CHOND MSM FORMULA) TABS Take 1 capsule by mouth daily.     Omega-3 Fatty Acids (FISH OIL) 1200 MG CAPS Take 1,200 mg by mouth daily.     sodium chloride (OCEAN) 0.65 % SOLN nasal spray Place 1 spray into both nostrils as needed for congestion.     No current facility-administered medications for this visit.    Allergies  Allergen Reactions   Alendronate Swelling   Fosamax [Alendronate Sodium] Swelling    Joint swelling   Poison Ivy Extract     Severe hives, needed steroid taper last time   Trazodone And Nefazodone     Nightmares    Review of Systems negative except from HPI and PMH  Physical Exam BP 112/70   Pulse 68   Ht '5\' 5"'$  (1.651 m)   Wt 127 lb 9.6 oz (57.9 kg)   SpO2 95%   BMI 21.23 kg/m  Well developed and well nourished in no acute distress HENT normal Neck supple with JVP-flat Clear Device pocket well healed; without hematoma or erythema.  There is no tethering  Regular rate and rhythm, no murmur Abd-soft with active BS No Clubbing cyanosis tr edema Skin-warm and dry A & Oriented  Grossly normal sensory and motor function  ECG sinus at 68 Interval 20/12/44 1/23 >> atrial fibrillation at 88  12/22 >>NSR 12/22  AFIB 5/22 >> NSR 6/21 >> NSR  Both Personally reviewed    Assessment and  Plan  Mitral valve prolapse  Persistent atrial fibrillation   HFpEF   PVC quiescient  LBBB/IVCD    Episodic palpitations and "anxiety "thinks it is related to atrial fibrillation, but we never did get an monitor.  We discussed alternatives including AliveCor monitor or an Apple watch.  She is leaning towards the former, we will discuss this with her daughter, the PhD at [facility].  It would be important to try to sort out whether her episodic symptoms related to atrial fibrillation or not and I am not altogether sure that they do.  Her dyspnea  is problematic.  There is a little bit of volume; with her mild depression of LV function, we will undertake a furosemide trial to 20 mg daily x3 days and then q. OD x5 doses and we will reassess.  I have also reached out to Dr. Virgina Jock as to whether  she should be considered for an SGLT2, it would be my inclination.  As related to her concern about her competing end-of-lives, we discussed DNR status for her and her husband--as suggested that when they are on vacation together the family with the 2 daughters in town that having these discussions as well as long-term care planning discussions would be appropriate.

## 2021-11-02 NOTE — Patient Instructions (Addendum)
Medication Instructions:   Your physician has recommended you make the following change in your medication:   Begin Furosemide '20mg'$  - 1 tablet by mouth daily x 3 days then 1 tablet by mouth every other day x 5 doses.  *If you need a refill on your cardiac medications before your next appointment, please call your pharmacy*   Lab Work: None ordered.  If you have labs (blood work) drawn today and your tests are completely normal, you will receive your results only by: [place] (if you have MyChart) OR A paper copy in the mail If you have any lab test that is abnormal or we need to change your treatment, we will call you to review the results.   Testing/Procedures: None ordered.    Follow-Up: At [facility], you and your health needs are our priority.  As part of our continuing mission to provide you with exceptional heart care, we have created designated Provider Care Teams.  These Care Teams include your primary Cardiologist (physician) and Advanced Practice Providers (APPs -  Physician Assistants and Nurse Practitioners) who all work together to provide you with the care you need, when you need it.  We recommend signing up for the patient portal called "MyChart".  Sign up information is provided on this After Visit Summary.  MyChart is used to connect with patients for Virtual Visits (Telemedicine).  Patients are able to view lab/test results, encounter notes, upcoming appointments, etc.  Non-urgent messages can be sent to your provider as well.   To learn more about what you can do with MyChart, go to [email or web address].    Your next appointment:   6 months with Dr Caryl Comes }    Other Instructions Referral made to [facility] for movement disorder as discussed by Dr Caryl Comes.  Important Information About Sugar

## 2021-11-09 ENCOUNTER — Encounter: Payer: Self-pay | Admitting: Neurology

## 2021-11-15 ENCOUNTER — Encounter (INDEPENDENT_AMBULATORY_CARE_PROVIDER_SITE_OTHER): Payer: Medicare Other | Admitting: Ophthalmology

## 2021-11-15 DIAGNOSIS — H43813 Vitreous degeneration, bilateral: Secondary | ICD-10-CM | POA: Diagnosis not present

## 2021-11-15 DIAGNOSIS — H348112 Central retinal vein occlusion, right eye, stable: Secondary | ICD-10-CM

## 2021-11-15 DIAGNOSIS — D3132 Benign neoplasm of left choroid: Secondary | ICD-10-CM | POA: Diagnosis not present

## 2021-12-07 NOTE — Progress Notes (Signed)
Assessment/Plan:   1.  Parkinson's disease.  The patient has tremor, bradykinesia, rigidity and postural instability.  -We discussed the diagnosis as well as pathophysiology of the disease.  We discussed treatment options as well as prognostic indicators.  Patient education was provided.  -Greater than 50% of the 60 minute visit was spent in counseling answering questions and talking about what to expect now as well as in the future.  We talked about medication options as well as potential future surgical options.  We talked about safety in the home.  -We decided to add carbidopa/levodopa 25/100.  1/2 tab tid x 1 wk, then 1/2 in am & noon & 1 at night for a week, then 1/2 in am &1 at noon &night for a week, then 1 po tid.  Risks, benefits, side effects and alternative therapies were discussed.  The opportunity to ask questions was given and they were answered to the best of my ability.  The patient expressed understanding and willingness to follow the outlined treatment protocols.  -I will refer the patient to the Parkinson's program [facility], for PT  -We discussed community resources in the area including patient support groups and community exercise programs for PD and pt education was provided to the patient.  She met with my LCSW today.  -MRI brain will be performed.  Discussed with her that it is likely we will see atrophy and small vessel disease.  Just want to make sure we are not missing anything else.  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  I am happy to see that they have just hired a professional caregiver, who will hopefully help to relieve some of the caregiving burden from the patient.   Subjective:   [name] was seen today in the movement disorders clinic for neurologic consultation at the request of Deboraha Sprang, MD.  The consultation is for the evaluation of R hand tremor.  Outside records that were made available to  me were reviewed.  Son-in-law on the phone at the end and asked questions.  Pt states that it is in the R hand only.    Pt believes that tremor start after covid vaccine (first series).  She states that it was several years ago but not exactly sure when   Specific Symptoms:  Tremor: Yes.  , it has gotten worse over the years since starting.  Will wake her up from sleep.  She will note it at rest and with activation.  She has hx of plate/screws in that wrist hand so grasp is limited in that hand anyway.  No tremor in the R leg or on the L side.   Family hx of similar:  No. (Cousins only with Parkinsons Disease ) Voice: feels hoarse Sleep: trouble with sleep; husband has dementia and pt is caregiver (they just hired a Nutritional therapist for him)  Vivid Dreams:  No.  Acting out dreams:  No. Wet Pillows: No. Postural symptoms:  Yes.    Falls?  No. (Has near falls - sense she may fall) Bradykinesia symptoms: shuffling gait (only when tired); little slower but attributes to age Loss of smell:  No. Loss of taste:  No. Urinary Incontinence:  No. (Has some urgency but doesn't have to wear undergarment) Difficulty Swallowing:  No. Handwriting, micrographia: Yes.   Trouble with ADL's:  No.  Trouble buttoning clothing: Yes.   Depression:  occasionally (associated with caregiving and taking care of  the home on her own) Memory changes:  No. N/V:  No. Lightheaded:  states has always had some of this from "tachycardia" Diplopia:  No. (Does state she has "eye migraine" and will get floaters in eye)   Never had MRI brain.  Had CT brain in 04/2018   ALLERGIES:   Allergies  Allergen Reactions   Alendronate Swelling   Fosamax [Alendronate Sodium] Swelling    Joint swelling   Poison Ivy Extract     Severe hives, needed steroid taper last time   Trazodone And Nefazodone     Nightmares    CURRENT MEDICATIONS:  Current Meds  Medication Sig   acetaminophen (TYLENOL) 500 MG tablet Take  500-1,000 mg by mouth every 6 (six) hours as needed for moderate pain.   apixaban (ELIQUIS) 2.5 MG TABS tablet TAKE ONE TABLET TWICE DAILY   Biotin 5000 MCG TABS Take 5,000 mcg by mouth daily.   Calcium Carb-Cholecalciferol (CALTRATE 600+D3 PO) Take 1 tablet by mouth daily.   cholecalciferol (VITAMIN D3) 25 MCG (1000 UNIT) tablet Take 1,000 Units by mouth daily.   denosumab (PROLIA) 60 MG/ML SOSY injection Inject 60 mg into the skin every 6 (six) months.   docusate sodium 100 MG CAPS Take 100 mg by mouth 2 (two) times daily.   FARXIGA 10 MG TABS tablet Take 10 mg by mouth daily.   fluticasone (FLONASE) 50 MCG/ACT nasal spray Place 1 spray into both nostrils daily as needed for allergies.   FLUTICASONE PROPIONATE, INHAL, IN Inhale into the lungs. Take in the evening   furosemide (LASIX) 20 MG tablet Take 1 tablet by mouth daily x 3 days then every other day x 5 doses. (Patient taking differently: Take 1 tablet by mouth daily x 3 days then every other day x 5 doses   Take As needed)   glucosamine-chondroitin 500-400 MG tablet Take 1 tablet by mouth 3 (three) times daily.   loratadine (CLARITIN) 10 MG tablet Take 10 mg by mouth daily.   Misc Natural Products (GLUCOSAMINE CHOND MSM FORMULA) TABS Take 1 capsule by mouth daily.   Omega-3 Fatty Acids (FISH OIL) 1200 MG CAPS Take 1,200 mg by mouth daily.   sodium chloride (OCEAN) 0.65 % SOLN nasal spray Place 1 spray into both nostrils as needed for congestion.     Objective:   VITALS:   Vitals:   12/21/21 0942  BP: (!) 144/77  Pulse: 73  SpO2: 100%  Weight: 125 lb (56.7 kg)  Height: _0  (1.651 m)    GEN:  The patient appears stated age and is in NAD. HEENT:  Normocephalic, atraumatic.  The mucous membranes are moist. The superficial temporal arteries are without ropiness or tenderness. CV:  RRR Lungs:  CTAB Neck/HEME:  There are no carotid bruits bilaterally.  Neurological examination:  Orientation: The patient is alert and  oriented x3.  Cranial nerves: There is good facial symmetry. There is facial hypomimia when distracted.  Extraocular muscles are intact. The visual fields are full to confrontational testing. The speech is fluent and clear. Soft palate rises symmetrically and there is no tongue deviation. Hearing is intact to conversational tone. Sensation: Sensation is intact to light and pinprick throughout (facial, trunk, extremities). Vibration is intact at the bilateral big toe. There is no extinction with double simultaneous stimulation. There is no sensory dermatomal level identified. Motor: Strength is 5/5 in the bilateral upper and lower extremities.   Shoulder shrug is equal and symmetric.  There is no pronator drift.  Deep tendon reflexes: Deep tendon reflexes are 1/4 at the bilateral biceps, triceps, brachioradialis, patella and achilles. Plantar responses are downgoing bilaterally.  Movement examination: Tone: There is mild increased tone in the RUE/RLE.  Tone is normal on the left. Abnormal movements: there is RUE rest tremor that slightly increases with distraction Coordination:  There is mild decremation with RAM's, with any form of RAMS, including alternating supination and pronation of the forearm, hand opening and closing, finger taps, heel taps and toe taps on the R Gait and Station: The patient has mild difficulty arising out of a deep-seated chair without the use of the hands. The patient's stride length is decreased.  She shuffles and has almost no arm swing bilaterally.   I have reviewed and interpreted the following labs independently   Chemistry      Component Value Date/Time   NA 142 11/19/2016 1001   K 4.9 11/19/2016 1001   CL 102 11/19/2016 1001   CO2 28 11/19/2016 1001   BUN 30 (H) 11/19/2016 1001   CREATININE 0.92 11/19/2016 1001      Component Value Date/Time   CALCIUM 10.1 11/19/2016 1001      No results found for: "TSH" Lab Results  Component Value Date   WBC 6.8  11/19/2016   HGB 15.0 11/19/2016   HCT 43.1 11/19/2016   MCV 85 11/19/2016   PLT 193 11/19/2016     Total time spent on today's visit was 60 minutes, including both face-to-face time and nonface-to-face time.  Time included that spent on review of records (prior notes available to me/labs/imaging if pertinent), discussing treatment and goals, answering patient's questions and coordinating care.  Cc:  Shon Baton, MD

## 2021-12-08 DIAGNOSIS — E785 Hyperlipidemia, unspecified: Secondary | ICD-10-CM | POA: Diagnosis not present

## 2021-12-11 ENCOUNTER — Other Ambulatory Visit: Payer: Self-pay | Admitting: Internal Medicine

## 2021-12-11 DIAGNOSIS — I4819 Other persistent atrial fibrillation: Secondary | ICD-10-CM

## 2021-12-11 NOTE — Telephone Encounter (Addendum)
Prescription refill request for Eliquis received. Indication:Afib  Last office visit:11/02/21 Caryl Comes)  Scr: 0.7 (12/08/21 via PCP)  Age: 86 Weight: 57.9kg   Called PCP and had most recent labs faxed from 12/08/21. Appropriate dose and refill sent to requested pharmacy.

## 2021-12-18 DIAGNOSIS — L820 Inflamed seborrheic keratosis: Secondary | ICD-10-CM | POA: Diagnosis not present

## 2021-12-18 DIAGNOSIS — D171 Benign lipomatous neoplasm of skin and subcutaneous tissue of trunk: Secondary | ICD-10-CM | POA: Diagnosis not present

## 2021-12-18 DIAGNOSIS — D225 Melanocytic nevi of trunk: Secondary | ICD-10-CM | POA: Diagnosis not present

## 2021-12-18 DIAGNOSIS — Z85828 Personal history of other malignant neoplasm of skin: Secondary | ICD-10-CM | POA: Diagnosis not present

## 2021-12-18 DIAGNOSIS — L821 Other seborrheic keratosis: Secondary | ICD-10-CM | POA: Diagnosis not present

## 2021-12-18 DIAGNOSIS — L570 Actinic keratosis: Secondary | ICD-10-CM | POA: Diagnosis not present

## 2021-12-21 ENCOUNTER — Ambulatory Visit (INDEPENDENT_AMBULATORY_CARE_PROVIDER_SITE_OTHER): Payer: Medicare Other | Admitting: Neurology

## 2021-12-21 ENCOUNTER — Encounter: Payer: Self-pay | Admitting: Neurology

## 2021-12-21 VITALS — BP 144/77 | HR 73 | Ht 65.0 in | Wt 125.0 lb

## 2021-12-21 DIAGNOSIS — G20 Parkinson's disease: Secondary | ICD-10-CM | POA: Diagnosis not present

## 2021-12-21 MED ORDER — CARBIDOPA-LEVODOPA 25-100 MG PO TABS: 1.0000 | ORAL_TABLET | Freq: Three times a day (TID) | ORAL | 1 refills | Status: DC

## 2021-12-21 NOTE — Patient Instructions (Addendum)
Start Carbidopa Levodopa as follows: Take 1/2 tablet three times daily, at least 30 minutes before meals (approximately 7am/11am/4pm), for one week Then take 1/2 tablet in the morning, 1/2 tablet in the afternoon, 1 tablet in the evening, at least 30 minutes before meals, for one week Then take 1/2 tablet in the morning, 1 tablet in the afternoon, 1 tablet in the evening, at least 30 minutes before meals, for one week Then take 1 tablet three times daily at 7am/11am/4pm, at least 30 minutes before meals   As a reminder, carbidopa/levodopa can be taken at the same time as a carbohydrate, but we like to have you take your pill either 30 minutes before a protein source or 1 hour after as protein can interfere with carbidopa/levodopa absorption.  Local and Online Resources for Power over Parkinson's Group August 2023  LOCAL [place] PARKINSON'S GROUPS  Power over Parkinson's Group:   Power Over Parkinson's Patient Education Group will be Wednesday, August 9th-*Hybrid meting*- in person at [facility] location and via [facility] at 2:00 pm.   Upcoming Power over Parkinson's Meetings:  2nd Wednesdays of the month at 2 pm:  August 9th, September 13th, October 11th Contact Amy Marriott at [email or web address] if interested in participating in this group Parkinson's Care Partners Group:    3rd Mondays, Contact Misty Paladino Atypical Parkinsonian Patient Group:   4th Wednesdays, Contact Misty Paladino If you are interested in participating in these groups with Misty, please contact her directly for how to join those meetings.  Her contact information is [email or web address].    LOCAL EVENTS AND NEW OFFERINGS Parkinson's T-shirts for sale!  Designed by a local group member, with funds going to [employer]. X-Large sizes available.  Contact Misty to purchase  New PWR! Moves [employer] Instructor-Led Class offering at [employer]!  Wednesdays 1-2 pm,  starting April 12th.   Contact Ronalee Belts Sabin at [employer], [email or web address]   [place]:  [email or web address] PD Health at Home continues:  Mindfulness Mondays, Wellness Wednesdays, Fitness Fridays  Upcoming Education:   Care Partners:  Why they Matter and why their Needs Matter.  Wednesday, August 9th 1-2 pm Invisible Symptoms:  NonMotor Symptoms.  Wednesday, August 16th 1-2 pm Expert Briefing:   Parkinson's Disease and the Bladder.  Wednesday, September 13th 1:00-2:00 pm Register for expert briefings (webinars) at September 26 Please check out their website to sign up for emails and see their full online offerings   [place]:  [email or web address]  Third Thursday Webinars:  On the third Thursday of every month at 12 p.m. ET, join our free live webinars to learn about various aspects of living with Parkinson's disease and our work to speed medical breakthroughs. Upcoming Webinar: Too Much or Not Enough:  Dyskinesia and "off" time in Parkinson's (Replay).  Thursday, August 17th at 12 noon. Check out additional information on their website to see their full online offerings  [facility]:  [email or web address] Upcoming Webinar:   Stay tuned Webinar Series:  Living with Parkinson's Meetup.   Third Thursdays each month, 3 pm Care Partner Monthly Meetup.  With 06-14-1983 [place].  First Tuesday of each month, 2 pm Check out additional information to Live Well Today on their website  Parkinson and Movement Disorders (PMD) Alliance:  www.[email or web address] [employer] Online:  Online Education Events Sign up for emails, which are sent weekly to give you updates on programming and online offerings  Parkinson's Association of the [place]:  www.[email or web address] Information on online  support groups, education events, and online  exercises including Yoga, Parkinson's exercises and more-LOTS of information on links to PD resources and online events Virtual Support Group through Parkinson's Association of the [place]; next one is scheduled for Wednesday, September 6th at 2 pm. (These are typically scheduled for the 1st Wednesday of the month at 2 pm).  Visit website for details. Save the date for "Caring for Parkinson's-Caring for You", 9th Annual Symposium.  In-person event in [place].  September 9th.  More info on registration to come. MOVEMENT AND EXERCISE OPPORTUNITIES PWR! Moves Classes at [place].  Wednesdays 10 and 11 am.   Contact Amy Marriott, PT [email or web address] if interested. NEW PWR! Moves Class offering at [employer].  Wednesdays 1-2 pm, starting April 12th.  Contact Caron Presume at [place], [email or web address] Here is a link to the PWR!Moves classes on Zoom from [place] - Daily Mon-Sat at 10:00. Via Zoom, FREE and open to all.  There is also a link below via Facebook if you use that platform.  [email or web address].si [email or web address]  Parkinson's Wellness Recovery (PWR! Moves)  www.[email or web address] Info on the PWR! Virtual Experience:  You will have access to our expertise through self-assessment, guided plans that start with the PD-specific fundamentals, educational content, tips, Q&A with an expert, and a growing Art therapist of PD-specific pre-recorded and live exercise classes of varying types and intensity - both physical and cognitive! If that is not enough, we offer 1:1 wellness consultations (in-person or virtual) to personalize your PWR! Research scientist (medical).  [place] Fridays:  As part of the PD Health @ Home program, this free video series focuses  each week on one aspect of fitness designed to support people living with Parkinson's.  These weekly videos highlight the [place] recent fitness guidelines for people with Parkinson's disease. ModemGamers.si Dance for PD website is offering free, live-stream classes throughout the week, as well as links to [employer] of classes:  [email or web address]/ Virtual dance and Pilates for Parkinson's classes: Click on the Community Tab> Parkinson's Movement Initiative Tab.  To register for classes and for more information, visit [email or web address] and click the "community" tab.  [employer] Parkinson's Cycling Classes  [employer]:  Thursdays @ Noon-Live classes at [employer] ([employer] at [email or web address] or [phone]) [employer]: Virtual Classes Mondays and Thursdays Jeanette Caprice classes Tuesday, Wednesday and Thursday (contact [place] at [email or web address]  or [phone]) [place] Varied levels of classes are offered Tuesdays and Thursdays at [facility].  Stretching with Verdis Frederickson weekly class is also offered for people with Parkinson's To observe a class or for more information, call [phone] or email Hezzie Bump at [email or web address] ADDITIONAL SUPPORT AND RESOURCES Well[employer]:Online Caregiver Education Opportunities:  www.well-[email or web address]/caregiver-education/caregiver-support-group.  You may also contact Vickki Muff at [email or web address] or [phone].    Well-Spring Navigator:  [phone] program, a free service to help individuals and families through the journey of determining care for older adults.  The "Navigator" is a [employer], Education officer, museum, who will speak with a prospective client and/or loved ones to provide an assessment of the situation and a set of recommendations for a personalized care  plan -- all free of charge, and whether Well[employer]Spring [employer] offers the needed service or not. If the need is not a service we provide, we are well-connected with reputable programs in town that we can refer you to.  www.well-springsolutions.org or to speak with the Navigator, call [phone].

## 2022-01-03 ENCOUNTER — Encounter (INDEPENDENT_AMBULATORY_CARE_PROVIDER_SITE_OTHER): Payer: Medicare Other | Admitting: Ophthalmology

## 2022-01-03 DIAGNOSIS — H43813 Vitreous degeneration, bilateral: Secondary | ICD-10-CM | POA: Diagnosis not present

## 2022-01-03 DIAGNOSIS — D3132 Benign neoplasm of left choroid: Secondary | ICD-10-CM

## 2022-01-03 DIAGNOSIS — H348112 Central retinal vein occlusion, right eye, stable: Secondary | ICD-10-CM

## 2022-01-04 ENCOUNTER — Encounter: Payer: Self-pay | Admitting: Rehabilitative and Restorative Service Providers"

## 2022-01-04 ENCOUNTER — Telehealth: Payer: Self-pay | Admitting: Rehabilitative and Restorative Service Providers"

## 2022-01-04 ENCOUNTER — Ambulatory Visit: Payer: Medicare Other | Attending: Neurology | Admitting: Rehabilitative and Restorative Service Providers"

## 2022-01-04 DIAGNOSIS — R2681 Unsteadiness on feet: Secondary | ICD-10-CM

## 2022-01-04 DIAGNOSIS — R293 Abnormal posture: Secondary | ICD-10-CM

## 2022-01-04 DIAGNOSIS — M6281 Muscle weakness (generalized): Secondary | ICD-10-CM

## 2022-01-04 DIAGNOSIS — G20 Parkinson's disease: Secondary | ICD-10-CM | POA: Insufficient documentation

## 2022-01-04 DIAGNOSIS — R2689 Other abnormalities of gait and mobility: Secondary | ICD-10-CM | POA: Diagnosis not present
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
## 2022-01-04 NOTE — Telephone Encounter (Signed)
Dr. Carles Collet, [name] was evaluated by physical therapy on 01/04/22.  The patient may benefit from: 1) OT evaluation due to dec'd R hand function (some of this is from prior injury, and she has some dec'd function reporting tightening)  2) ST evaluation due to hoarseness and decreased speech volume.   If you agree, please place an order in [facility] in [facility] or fax the order to [phone]. Thank you, [place], [place] [facility] [address] [place], [place]  [address] Phone:  [phone] Fax:  [phone]

## 2022-01-07 ENCOUNTER — Ambulatory Visit
Admission: RE | Admit: 2022-01-07 | Discharge: 2022-01-07 | Disposition: A | Discharge status: Home / Self Care | Payer: Medicare Other | Source: Ambulatory Visit | Attending: Neurology | Admitting: Neurology

## 2022-01-07 DIAGNOSIS — G20 Parkinson's disease: Secondary | ICD-10-CM | POA: Diagnosis not present

## 2022-01-07 DIAGNOSIS — G250 Essential tremor: Secondary | ICD-10-CM | POA: Diagnosis not present

## 2022-01-09 ENCOUNTER — Encounter: Payer: Self-pay | Admitting: Rehabilitative and Restorative Service Providers"

## 2022-01-09 ENCOUNTER — Ambulatory Visit: Payer: Medicare Other | Admitting: Rehabilitative and Restorative Service Providers"

## 2022-01-09 DIAGNOSIS — R2681 Unsteadiness on feet: Secondary | ICD-10-CM

## 2022-01-09 DIAGNOSIS — R2689 Other abnormalities of gait and mobility: Secondary | ICD-10-CM | POA: Diagnosis not present

## 2022-01-09 DIAGNOSIS — M6281 Muscle weakness (generalized): Secondary | ICD-10-CM

## 2022-01-09 DIAGNOSIS — G20 Parkinson's disease: Secondary | ICD-10-CM | POA: Diagnosis not present

## 2022-01-09 DIAGNOSIS — R293 Abnormal posture: Secondary | ICD-10-CM | POA: Diagnosis not present
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
## 2022-01-11 ENCOUNTER — Ambulatory Visit: Payer: Medicare Other | Admitting: Rehabilitative and Restorative Service Providers"

## 2022-01-11 ENCOUNTER — Other Ambulatory Visit: Payer: Self-pay | Admitting: *Deleted

## 2022-01-11 ENCOUNTER — Encounter: Payer: Self-pay | Admitting: Rehabilitative and Restorative Service Providers"

## 2022-01-11 DIAGNOSIS — M6281 Muscle weakness (generalized): Secondary | ICD-10-CM | POA: Diagnosis not present

## 2022-01-11 DIAGNOSIS — R2689 Other abnormalities of gait and mobility: Secondary | ICD-10-CM | POA: Diagnosis not present

## 2022-01-11 DIAGNOSIS — R293 Abnormal posture: Secondary | ICD-10-CM | POA: Diagnosis not present

## 2022-01-11 DIAGNOSIS — G20 Parkinson's disease: Secondary | ICD-10-CM | POA: Diagnosis not present

## 2022-01-11 DIAGNOSIS — R2681 Unsteadiness on feet: Secondary | ICD-10-CM

## 2022-01-11 DIAGNOSIS — I8393 Asymptomatic varicose veins of bilateral lower extremities: Secondary | ICD-10-CM
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
## 2022-01-16 ENCOUNTER — Ambulatory Visit: Payer: Medicare Other | Admitting: Rehabilitative and Restorative Service Providers"

## 2022-01-16 DIAGNOSIS — R2681 Unsteadiness on feet: Secondary | ICD-10-CM

## 2022-01-16 DIAGNOSIS — M6281 Muscle weakness (generalized): Secondary | ICD-10-CM | POA: Diagnosis not present

## 2022-01-16 DIAGNOSIS — R293 Abnormal posture: Secondary | ICD-10-CM

## 2022-01-16 DIAGNOSIS — R2689 Other abnormalities of gait and mobility: Secondary | ICD-10-CM

## 2022-01-16 DIAGNOSIS — G20 Parkinson's disease: Secondary | ICD-10-CM | POA: Diagnosis not present
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
## 2022-01-18 ENCOUNTER — Ambulatory Visit: Payer: Medicare Other | Admitting: Rehabilitative and Restorative Service Providers"

## 2022-01-18 DIAGNOSIS — R293 Abnormal posture: Secondary | ICD-10-CM | POA: Diagnosis not present

## 2022-01-18 DIAGNOSIS — R2689 Other abnormalities of gait and mobility: Secondary | ICD-10-CM

## 2022-01-18 DIAGNOSIS — M6281 Muscle weakness (generalized): Secondary | ICD-10-CM | POA: Diagnosis not present

## 2022-01-18 DIAGNOSIS — R2681 Unsteadiness on feet: Secondary | ICD-10-CM | POA: Diagnosis not present

## 2022-01-18 DIAGNOSIS — G20 Parkinson's disease: Secondary | ICD-10-CM | POA: Diagnosis not present
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
## 2022-01-22 ENCOUNTER — Encounter: Payer: Self-pay | Admitting: Surgery

## 2022-01-22 ENCOUNTER — Ambulatory Visit (INDEPENDENT_AMBULATORY_CARE_PROVIDER_SITE_OTHER): Payer: Medicare Other | Admitting: Surgery

## 2022-01-22 ENCOUNTER — Ambulatory Visit (HOSPITAL_COMMUNITY)
Admission: RE | Admit: 2022-01-22 | Discharge: 2022-01-22 | Disposition: A | Discharge status: Home / Self Care | Payer: Medicare Other | Source: Ambulatory Visit | Attending: Surgery | Admitting: Surgery

## 2022-01-22 VITALS — BP 112/67 | HR 80 | Temp 97.7°F | Resp 20 | Ht 65.0 in | Wt 123.0 lb

## 2022-01-22 DIAGNOSIS — M7989 Other specified soft tissue disorders: Secondary | ICD-10-CM | POA: Diagnosis not present

## 2022-01-22 DIAGNOSIS — I8393 Asymptomatic varicose veins of bilateral lower extremities: Secondary | ICD-10-CM | POA: Diagnosis not present

## 2022-01-22 NOTE — Progress Notes (Signed)
[facility] [place]  Patient name: [name] MRN: [id number] DOB: August 07, 1935 Sex: female   REQUESTING PROVIDER:    Dr. Virgina Jock   REASON FOR CONSULT:    Venous disease  HISTORY OF PRESENT ILLNESS:   [name] is a 86 y.o. female, who is referred for evaluation of lower extremity venous disease.  The patient states that she has been experiencing cramps at night.  This first is in her toes and has also been in her legs.  She has noted some left ankle swelling.  There are no aggravating or relieving factors other than time.  She has tried over-the-counter remedies such as mustard.  She has worn compression socks but not consistently.  She is on Eliquis for atrial fibrillation.  She has recently been diagnosed with Parkinson's.  PAST MEDICAL HISTORY    Past Medical History:  Diagnosis Date   Allergic rhinitis    Arthritis    Atrial fibrillation (HCC)    BCC (basal cell carcinoma)    Benign tumor of pleura 2007   Bursitis    Bursitis of shoulder    Cataracts, bilateral    Complication of anesthesia    Cough since 05-09-2014   with sore throat   Diastolic dysfunction    Fracture    at l1   Fracture    right arm x 2   Glaucoma (increased eye pressure)    Hearing loss    History of blood transfusion age 82 or 8   Hyperlipidemia    IBS (irritable bowel syndrome)    Insomnia    Lung tumor (benign)    OA (osteoarthritis)    Osteopenia    Paroxysmal atrial fibrillation (HCC)    PONV (postoperative nausea and vomiting) 48 yrs ago   with ether   Sarcoidosis    As a child   Seasonal allergies    Tortuous aortic arch      FAMILY HISTORY   Family History  Problem Relation Age of Onset   Heart attack Mother    Rheumatic fever Father    Heart disease Father    CVA Father    Heart disease Brother    Bladder Cancer Brother    Cancer Maternal Grandfather    Breast cancer Daughter 11   Colon cancer Maternal Aunt     Breast cancer Niece 2   Esophageal cancer Neg Hx    Stomach cancer Neg Hx    Rectal cancer Neg Hx     SOCIAL HISTORY:   Social History   Socioeconomic History   Marital status: Married    Spouse name: Not on file   Number of children: 2   Years of education: Not on file   Highest education level: Not on file  Occupational History   Occupation: retired    Fish farm manager: RETIRED    Comment: Education officer, museum; retired from Engineer, petroleum  Tobacco Use   Smoking status: Never   Smokeless tobacco: Never  Vaping Use   Vaping Use: Never used  Substance and Sexual Activity   Alcohol use: Yes    Comment: occasional   Drug use: No   Sexual activity: Not on file  Other Topics Concern   Not on file  Social History Narrative   Lives with husband   Daughter is a Scientist, research (medical)    Lives in a two story home with a chair lift.    Social Determinants of Health   Financial  Resource Strain: Not on file  Food Insecurity: Not on file  Transportation Needs: Not on file  Physical Activity: Not on file  Stress: Not on file  Social Connections: Not on file  Intimate Partner Violence: Not on file    ALLERGIES:    Allergies  Allergen Reactions   Alendronate Swelling   Fosamax [Alendronate Sodium] Swelling    Joint swelling   Poison Ivy Extract     Severe hives, needed steroid taper last time   Trazodone And Nefazodone     Nightmares    CURRENT MEDICATIONS:    Current Outpatient Medications  Medication Sig Dispense Refill   acetaminophen (TYLENOL) 500 MG tablet Take 500-1,000 mg by mouth every 6 (six) hours as needed for moderate pain.     apixaban (ELIQUIS) 2.5 MG TABS tablet TAKE ONE TABLET TWICE DAILY 180 tablet 1   Biotin 5000 MCG TABS Take 5,000 mcg by mouth daily.     Calcium Carb-Cholecalciferol (CALTRATE 600+D3 PO) Take 1 tablet by mouth daily.     carbidopa-levodopa (SINEMET IR) 25-100 MG tablet Take 1 tablet by mouth 3 (three) times daily. 7am/11am/4pm 270  tablet 1   cholecalciferol (VITAMIN D3) 25 MCG (1000 UNIT) tablet Take 1,000 Units by mouth daily.     colchicine 0.6 MG tablet Take 0.6 mg by mouth daily as needed.     denosumab (PROLIA) 60 MG/ML SOSY injection Inject 60 mg into the skin every 6 (six) months.     docusate sodium 100 MG CAPS Take 100 mg by mouth 2 (two) times daily. 10 capsule 0   FARXIGA 10 MG TABS tablet Take 10 mg by mouth daily.     fluticasone (FLONASE) 50 MCG/ACT nasal spray Place 1 spray into both nostrils daily as needed for allergies.     furosemide (LASIX) 20 MG tablet Take 1 tablet by mouth daily x 3 days then every other day x 5 doses. (Patient taking differently: Take 1 tablet by mouth daily x 3 days then every other day x 5 doses   Take As needed) 15 tablet 0   glucosamine-chondroitin 500-400 MG tablet Take 1 tablet by mouth 3 (three) times daily.     loratadine (CLARITIN) 10 MG tablet Take 10 mg by mouth daily.     Omega-3 Fatty Acids (FISH OIL) 1200 MG CAPS Take 1,200 mg by mouth daily.     sodium chloride (OCEAN) 0.65 % SOLN nasal spray Place 1 spray into both nostrils as needed for congestion.     No current facility-administered medications for this visit.    REVIEW OF SYSTEMS:   '[X]'$  denotes positive finding, '[ ]'$  denotes negative finding Cardiac  Comments:  Chest pain or chest pressure:    Shortness of breath upon exertion:    Short of breath when lying flat:    Irregular heart rhythm:        Vascular    Pain in calf, thigh, or hip brought on by ambulation:    Pain in feet at night that wakes you up from your sleep:     Blood clot in your veins:    Leg swelling:         Pulmonary    Oxygen at home:    Productive cough:     Wheezing:         Neurologic    Sudden weakness in arms or legs:     Sudden numbness in arms or legs:     Sudden onset of difficulty speaking  or slurred speech:    Temporary loss of vision in one eye:     Problems with dizziness:         Gastrointestinal    Blood in  stool:      Vomited blood:         Genitourinary    Burning when urinating:     Blood in urine:        Psychiatric    Major depression:         Hematologic    Bleeding problems:    Problems with blood clotting too easily:        Skin    Rashes or ulcers:        Constitutional    Fever or chills:     PHYSICAL EXAM:   Vitals:   01/22/22 1441  BP: 112/67  Pulse: 80  Resp: 20  Temp: 97.7 F (36.5 C)  SpO2: 97%  Weight: 123 lb (55.8 kg)  Height: '5\' 5"'$  (1.651 m)    GENERAL: The patient is a well-nourished female, in no acute distress. The vital signs are documented above. CARDIAC: There is a regular rate and rhythm.  VASCULAR: Triphasic posterior tibial Doppler signals.  Trace edema to the left leg and ankle PULMONARY: Nonlabored respirations ABDOMEN: Soft and non-tender with normal pitched bowel sounds.  MUSCULOSKELETAL: There are no major deformities or cyanosis. NEUROLOGIC: No focal weakness or paresthesias are detected. SKIN: There are no ulcers or rashes noted. PSYCHIATRIC: The patient has a normal affect.  STUDIES:   I have reviewed the following: Venous Reflux Times  +--------------+---------+------+-----------+------------+--------+  RIGHT         Reflux NoRefluxReflux TimeDiameter cmsComments                          Yes                                   +--------------+---------+------+-----------+------------+--------+  CFV                     yes   >1 second                       +--------------+---------+------+-----------+------------+--------+  FV prox                 yes   >1 second                       +--------------+---------+------+-----------+------------+--------+  FV mid                  yes   >1 second                       +--------------+---------+------+-----------+------------+--------+  FV dist                 yes   >1 second                        +--------------+---------+------+-----------+------------+--------+  Popliteal     no                                              +--------------+---------+------+-----------+------------+--------+  GSV at [facility]  yes    >500 ms     0.510              +--------------+---------+------+-----------+------------+--------+  GSV prox thigh          yes    >500 ms      0.47              +--------------+---------+------+-----------+------------+--------+  GSV mid thigh           yes    >500 ms      0.36              +--------------+---------+------+-----------+------------+--------+  GSV dist thigh          yes    >500 ms      0.32              +--------------+---------+------+-----------+------------+--------+  GSV at knee             yes    >500 ms      0.28              +--------------+---------+------+-----------+------------+--------+  GSV prox calf           yes    >500 ms      0.36              +--------------+---------+------+-----------+------------+--------+  SSV Pop Fossa no                            0.01              +--------------+---------+------+-----------+------------+--------+  SSV prox calf no                            0.13              +--------------+---------+------+-----------+------------+--------+   ASSESSMENT and PLAN   Venous reflux testing today does show saphenous vein reflux on the right leg, however vein diameters are fairly small.  In addition, she does not have a significant amount of edema in that leg.  Her biggest complaint is that of cramping at night.  I do not think that her venous disease is having a huge impact on that.  I have recommended that she wear compression socks as this should help alleviate some of the edema at the end of the day which hopefully will help minimize cramping.  Have also encouraged her to take a multivitamin and to stay hydrated.  I would not recommend endovenous  laser ablation at this time.  I also evaluated her arterial blood flow and I do not think that her symptoms are arterial in etiology.  The patient will contact me as needed.  I do not think that her vascular disease is contributing to her current symptoms.   Leia Alf, MD, FACS [facility] [phone] Pager [phone]

## 2022-01-23 ENCOUNTER — Ambulatory Visit: Payer: Medicare Other | Admitting: Rehabilitative and Restorative Service Providers"

## 2022-01-23 ENCOUNTER — Encounter: Payer: Self-pay | Admitting: Rehabilitative and Restorative Service Providers"

## 2022-01-23 DIAGNOSIS — R2689 Other abnormalities of gait and mobility: Secondary | ICD-10-CM | POA: Diagnosis not present

## 2022-01-23 DIAGNOSIS — R293 Abnormal posture: Secondary | ICD-10-CM

## 2022-01-23 DIAGNOSIS — R2681 Unsteadiness on feet: Secondary | ICD-10-CM

## 2022-01-23 DIAGNOSIS — G20 Parkinson's disease: Secondary | ICD-10-CM | POA: Diagnosis not present

## 2022-01-23 DIAGNOSIS — M6281 Muscle weakness (generalized): Secondary | ICD-10-CM
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
## 2022-01-25 ENCOUNTER — Ambulatory Visit: Payer: Medicare Other | Admitting: Rehabilitative and Restorative Service Providers"

## 2022-01-25 DIAGNOSIS — G20 Parkinson's disease: Secondary | ICD-10-CM | POA: Diagnosis not present

## 2022-01-25 DIAGNOSIS — R2689 Other abnormalities of gait and mobility: Secondary | ICD-10-CM

## 2022-01-25 DIAGNOSIS — R293 Abnormal posture: Secondary | ICD-10-CM

## 2022-01-25 DIAGNOSIS — R2681 Unsteadiness on feet: Secondary | ICD-10-CM

## 2022-01-25 DIAGNOSIS — M6281 Muscle weakness (generalized): Secondary | ICD-10-CM | POA: Diagnosis not present
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
## 2022-01-30 ENCOUNTER — Encounter: Payer: Self-pay | Admitting: Rehabilitative and Restorative Service Providers"

## 2022-01-30 ENCOUNTER — Other Ambulatory Visit: Payer: Self-pay

## 2022-01-30 ENCOUNTER — Ambulatory Visit: Payer: Medicare Other | Attending: Neurology | Admitting: Rehabilitative and Restorative Service Providers"

## 2022-01-30 ENCOUNTER — Ambulatory Visit: Payer: Medicare Other | Admitting: Occupational Therapy

## 2022-01-30 ENCOUNTER — Ambulatory Visit: Payer: Medicare Other

## 2022-01-30 DIAGNOSIS — R471 Dysarthria and anarthria: Secondary | ICD-10-CM

## 2022-01-30 DIAGNOSIS — R1310 Dysphagia, unspecified: Secondary | ICD-10-CM | POA: Diagnosis not present

## 2022-01-30 DIAGNOSIS — R278 Other lack of coordination: Secondary | ICD-10-CM

## 2022-01-30 DIAGNOSIS — R2689 Other abnormalities of gait and mobility: Secondary | ICD-10-CM

## 2022-01-30 DIAGNOSIS — R29818 Other symptoms and signs involving the nervous system: Secondary | ICD-10-CM | POA: Insufficient documentation

## 2022-01-30 DIAGNOSIS — R2681 Unsteadiness on feet: Secondary | ICD-10-CM | POA: Insufficient documentation

## 2022-01-30 DIAGNOSIS — R293 Abnormal posture: Secondary | ICD-10-CM | POA: Diagnosis not present

## 2022-01-30 DIAGNOSIS — M6281 Muscle weakness (generalized): Secondary | ICD-10-CM | POA: Diagnosis not present
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
## 2022-01-30 NOTE — Patient Instructions (Signed)
   Read 10 sentences with your "calling over the fence" voice.   Use your strong (normal) voice, not your strained voice!

## 2022-02-01 ENCOUNTER — Encounter: Payer: Self-pay | Admitting: Rehabilitative and Restorative Service Providers"

## 2022-02-01 ENCOUNTER — Ambulatory Visit: Payer: Medicare Other | Admitting: Rehabilitative and Restorative Service Providers"

## 2022-02-01 DIAGNOSIS — R29818 Other symptoms and signs involving the nervous system: Secondary | ICD-10-CM | POA: Diagnosis not present

## 2022-02-01 DIAGNOSIS — R278 Other lack of coordination: Secondary | ICD-10-CM | POA: Diagnosis not present

## 2022-02-01 DIAGNOSIS — R2689 Other abnormalities of gait and mobility: Secondary | ICD-10-CM

## 2022-02-01 DIAGNOSIS — R2681 Unsteadiness on feet: Secondary | ICD-10-CM | POA: Diagnosis not present

## 2022-02-01 DIAGNOSIS — M6281 Muscle weakness (generalized): Secondary | ICD-10-CM

## 2022-02-01 DIAGNOSIS — R293 Abnormal posture: Secondary | ICD-10-CM | POA: Diagnosis not present
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
## 2022-02-06 ENCOUNTER — Encounter: Payer: Self-pay | Admitting: Rehabilitative and Restorative Service Providers"

## 2022-02-06 ENCOUNTER — Ambulatory Visit: Payer: Medicare Other | Admitting: Rehabilitative and Restorative Service Providers"

## 2022-02-06 DIAGNOSIS — R293 Abnormal posture: Secondary | ICD-10-CM | POA: Diagnosis not present

## 2022-02-06 DIAGNOSIS — R2689 Other abnormalities of gait and mobility: Secondary | ICD-10-CM | POA: Diagnosis not present

## 2022-02-06 DIAGNOSIS — M6281 Muscle weakness (generalized): Secondary | ICD-10-CM | POA: Diagnosis not present

## 2022-02-06 DIAGNOSIS — R2681 Unsteadiness on feet: Secondary | ICD-10-CM

## 2022-02-06 DIAGNOSIS — R29818 Other symptoms and signs involving the nervous system: Secondary | ICD-10-CM

## 2022-02-06 DIAGNOSIS — R278 Other lack of coordination: Secondary | ICD-10-CM | POA: Diagnosis not present
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
## 2022-02-07 ENCOUNTER — Ambulatory Visit: Payer: Medicare Other | Admitting: Occupational Therapy

## 2022-02-07 DIAGNOSIS — R2689 Other abnormalities of gait and mobility: Secondary | ICD-10-CM

## 2022-02-07 DIAGNOSIS — R29818 Other symptoms and signs involving the nervous system: Secondary | ICD-10-CM

## 2022-02-07 DIAGNOSIS — R278 Other lack of coordination: Secondary | ICD-10-CM

## 2022-02-07 DIAGNOSIS — R2681 Unsteadiness on feet: Secondary | ICD-10-CM

## 2022-02-07 DIAGNOSIS — M6281 Muscle weakness (generalized): Secondary | ICD-10-CM | POA: Diagnosis not present

## 2022-02-07 DIAGNOSIS — R293 Abnormal posture: Secondary | ICD-10-CM | POA: Diagnosis not present
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
## 2022-02-09 ENCOUNTER — Ambulatory Visit: Payer: Medicare Other

## 2022-02-09 DIAGNOSIS — R2689 Other abnormalities of gait and mobility: Secondary | ICD-10-CM | POA: Diagnosis not present

## 2022-02-09 DIAGNOSIS — R29818 Other symptoms and signs involving the nervous system: Secondary | ICD-10-CM | POA: Diagnosis not present

## 2022-02-09 DIAGNOSIS — R293 Abnormal posture: Secondary | ICD-10-CM | POA: Diagnosis not present

## 2022-02-09 DIAGNOSIS — R471 Dysarthria and anarthria: Secondary | ICD-10-CM

## 2022-02-09 DIAGNOSIS — R1310 Dysphagia, unspecified: Secondary | ICD-10-CM

## 2022-02-09 DIAGNOSIS — M6281 Muscle weakness (generalized): Secondary | ICD-10-CM | POA: Diagnosis not present

## 2022-02-09 DIAGNOSIS — R278 Other lack of coordination: Secondary | ICD-10-CM | POA: Diagnosis not present

## 2022-02-09 DIAGNOSIS — R2681 Unsteadiness on feet: Secondary | ICD-10-CM | POA: Diagnosis not present
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
## 2022-02-12 ENCOUNTER — Ambulatory Visit: Payer: Medicare Other

## 2022-02-12 DIAGNOSIS — R293 Abnormal posture: Secondary | ICD-10-CM | POA: Diagnosis not present

## 2022-02-12 DIAGNOSIS — R29818 Other symptoms and signs involving the nervous system: Secondary | ICD-10-CM | POA: Diagnosis not present

## 2022-02-12 DIAGNOSIS — R2689 Other abnormalities of gait and mobility: Secondary | ICD-10-CM | POA: Diagnosis not present

## 2022-02-12 DIAGNOSIS — R2681 Unsteadiness on feet: Secondary | ICD-10-CM | POA: Diagnosis not present

## 2022-02-12 DIAGNOSIS — M6281 Muscle weakness (generalized): Secondary | ICD-10-CM | POA: Diagnosis not present

## 2022-02-12 DIAGNOSIS — R1310 Dysphagia, unspecified: Secondary | ICD-10-CM

## 2022-02-12 DIAGNOSIS — R471 Dysarthria and anarthria: Secondary | ICD-10-CM

## 2022-02-12 DIAGNOSIS — R278 Other lack of coordination: Secondary | ICD-10-CM | POA: Diagnosis not present
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
## 2022-02-13 ENCOUNTER — Encounter: Payer: Self-pay | Admitting: Physical Therapy

## 2022-02-13 ENCOUNTER — Ambulatory Visit: Payer: Medicare Other | Admitting: Occupational Therapy

## 2022-02-13 ENCOUNTER — Ambulatory Visit: Payer: Medicare Other | Admitting: Physical Therapy

## 2022-02-13 DIAGNOSIS — R278 Other lack of coordination: Secondary | ICD-10-CM

## 2022-02-13 DIAGNOSIS — R2689 Other abnormalities of gait and mobility: Secondary | ICD-10-CM | POA: Diagnosis not present

## 2022-02-13 DIAGNOSIS — R2681 Unsteadiness on feet: Secondary | ICD-10-CM

## 2022-02-13 DIAGNOSIS — M6281 Muscle weakness (generalized): Secondary | ICD-10-CM

## 2022-02-13 DIAGNOSIS — R29818 Other symptoms and signs involving the nervous system: Secondary | ICD-10-CM | POA: Diagnosis not present

## 2022-02-13 DIAGNOSIS — R293 Abnormal posture: Secondary | ICD-10-CM | POA: Diagnosis not present
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
## 2022-02-13 NOTE — Patient Instructions (Signed)
Coordination Exercises  Perform the following exercises for 15 minutes 1-2 times per day. Perform with right hand(s). Perform using big movements.  Flipping Cards: Place deck of cards on the table. Flip cards over by opening your hand big to grasp and then turn your palm up big. Deal cards: Hold 1/2 or whole deck in your hand. Use thumb to push card off top of deck with one big push. Flip card between each finger. Rotate ball with fingertips: Pick up with fingers/thumb and move as much as you can with each turn/movement (clockwise and counter-clockwise). Toss ball from one hand to the other: Toss big/high. Toss ball in the air and catch with the same hand: Toss big/high. Rotate 2 golf balls in your hand: Both directions. Pick up coins and place in coin bank or container: Pick up with big, intentional movements. Do not drag coin to the edge. Pick up coins and stack one at a time: Pick up with big, intentional movements. Do not drag coin to the edge. (5-10 in a stack) Pick up 5-10 coins one at a time and hold in palm. Then, move coins from palm to fingertips one at time and place in coin bank/container. Pick up 5-10 coins one at a time and hold in palm. Then, move coins from palm to fingertips one at a time to stack. Practice writing: Slow down, write big, and focus on forming each letter. Practice typing. Perform "Flicks"/hand stretches (PWR! Hands): Close hands then flick out your fingers with focus on opening hands, pulling wrists back, and extending elbows like you are pushing. Pick up various small objects that are different shapes/sizes and place in container. (paperclips, buttons, keys, dried beans/pasta, coins, screws, nuts/bolts, washers, board game pieces, etc.) Fasten nuts/bolts or put on bottle caps: Turn as much/as big as you can with each turn.
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
## 2022-02-19 ENCOUNTER — Ambulatory Visit: Payer: Medicare Other

## 2022-02-19 DIAGNOSIS — R278 Other lack of coordination: Secondary | ICD-10-CM | POA: Diagnosis not present

## 2022-02-19 DIAGNOSIS — M6281 Muscle weakness (generalized): Secondary | ICD-10-CM | POA: Diagnosis not present

## 2022-02-19 DIAGNOSIS — R1310 Dysphagia, unspecified: Secondary | ICD-10-CM

## 2022-02-19 DIAGNOSIS — R29818 Other symptoms and signs involving the nervous system: Secondary | ICD-10-CM | POA: Diagnosis not present

## 2022-02-19 DIAGNOSIS — R2681 Unsteadiness on feet: Secondary | ICD-10-CM | POA: Diagnosis not present

## 2022-02-19 DIAGNOSIS — R293 Abnormal posture: Secondary | ICD-10-CM | POA: Diagnosis not present

## 2022-02-19 DIAGNOSIS — R471 Dysarthria and anarthria: Secondary | ICD-10-CM

## 2022-02-19 DIAGNOSIS — R2689 Other abnormalities of gait and mobility: Secondary | ICD-10-CM | POA: Diagnosis not present
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
## 2022-02-19 NOTE — Patient Instructions (Signed)
   Use "ooooooo" vowel (as in who, and you) with non-stop consonants: FEEL THE AIR!!  Who [place]

## 2022-02-21 ENCOUNTER — Ambulatory Visit: Payer: Medicare Other | Admitting: Occupational Therapy

## 2022-02-21 DIAGNOSIS — M6281 Muscle weakness (generalized): Secondary | ICD-10-CM | POA: Diagnosis not present

## 2022-02-21 DIAGNOSIS — R29818 Other symptoms and signs involving the nervous system: Secondary | ICD-10-CM | POA: Diagnosis not present

## 2022-02-21 DIAGNOSIS — R293 Abnormal posture: Secondary | ICD-10-CM | POA: Diagnosis not present

## 2022-02-21 DIAGNOSIS — R2689 Other abnormalities of gait and mobility: Secondary | ICD-10-CM | POA: Diagnosis not present

## 2022-02-21 DIAGNOSIS — R278 Other lack of coordination: Secondary | ICD-10-CM

## 2022-02-21 DIAGNOSIS — R2681 Unsteadiness on feet: Secondary | ICD-10-CM | POA: Diagnosis not present
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
## 2022-02-22 ENCOUNTER — Ambulatory Visit: Payer: Medicare Other | Admitting: Rehabilitative and Restorative Service Providers"

## 2022-02-22 ENCOUNTER — Encounter: Payer: Self-pay | Admitting: Rehabilitative and Restorative Service Providers"

## 2022-02-22 DIAGNOSIS — R2681 Unsteadiness on feet: Secondary | ICD-10-CM

## 2022-02-22 DIAGNOSIS — R293 Abnormal posture: Secondary | ICD-10-CM | POA: Diagnosis not present

## 2022-02-22 DIAGNOSIS — R2689 Other abnormalities of gait and mobility: Secondary | ICD-10-CM | POA: Diagnosis not present

## 2022-02-22 DIAGNOSIS — M6281 Muscle weakness (generalized): Secondary | ICD-10-CM

## 2022-02-22 DIAGNOSIS — R29818 Other symptoms and signs involving the nervous system: Secondary | ICD-10-CM

## 2022-02-22 DIAGNOSIS — R278 Other lack of coordination: Secondary | ICD-10-CM | POA: Diagnosis not present
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
## 2022-02-27 ENCOUNTER — Ambulatory Visit: Payer: Medicare Other | Admitting: Occupational Therapy

## 2022-02-27 ENCOUNTER — Encounter: Payer: Self-pay | Admitting: Rehabilitative and Restorative Service Providers"

## 2022-02-27 ENCOUNTER — Ambulatory Visit: Payer: Medicare Other | Admitting: Rehabilitative and Restorative Service Providers"

## 2022-02-27 DIAGNOSIS — M6281 Muscle weakness (generalized): Secondary | ICD-10-CM

## 2022-02-27 DIAGNOSIS — R29818 Other symptoms and signs involving the nervous system: Secondary | ICD-10-CM | POA: Diagnosis not present

## 2022-02-27 DIAGNOSIS — R278 Other lack of coordination: Secondary | ICD-10-CM

## 2022-02-27 DIAGNOSIS — R2689 Other abnormalities of gait and mobility: Secondary | ICD-10-CM | POA: Diagnosis not present

## 2022-02-27 DIAGNOSIS — R293 Abnormal posture: Secondary | ICD-10-CM | POA: Diagnosis not present

## 2022-02-27 DIAGNOSIS — R2681 Unsteadiness on feet: Secondary | ICD-10-CM
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
## 2022-02-28 ENCOUNTER — Other Ambulatory Visit: Payer: Self-pay | Admitting: Internal Medicine

## 2022-02-28 DIAGNOSIS — Z1231 Encounter for screening mammogram for malignant neoplasm of breast: Secondary | ICD-10-CM

## 2022-03-05 ENCOUNTER — Ambulatory Visit: Payer: Medicare Other | Attending: Neurology | Admitting: Occupational Therapy

## 2022-03-05 DIAGNOSIS — R1310 Dysphagia, unspecified: Secondary | ICD-10-CM | POA: Insufficient documentation

## 2022-03-05 DIAGNOSIS — M6281 Muscle weakness (generalized): Secondary | ICD-10-CM

## 2022-03-05 DIAGNOSIS — R471 Dysarthria and anarthria: Secondary | ICD-10-CM | POA: Diagnosis not present

## 2022-03-05 DIAGNOSIS — R29818 Other symptoms and signs involving the nervous system: Secondary | ICD-10-CM | POA: Diagnosis not present

## 2022-03-05 DIAGNOSIS — R278 Other lack of coordination: Secondary | ICD-10-CM

## 2022-03-05 DIAGNOSIS — R2681 Unsteadiness on feet: Secondary | ICD-10-CM | POA: Diagnosis not present
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
## 2022-03-07 ENCOUNTER — Encounter (INDEPENDENT_AMBULATORY_CARE_PROVIDER_SITE_OTHER): Payer: Medicare Other | Admitting: Ophthalmology

## 2022-03-07 DIAGNOSIS — H43813 Vitreous degeneration, bilateral: Secondary | ICD-10-CM | POA: Diagnosis not present

## 2022-03-07 DIAGNOSIS — D3132 Benign neoplasm of left choroid: Secondary | ICD-10-CM

## 2022-03-07 DIAGNOSIS — H348112 Central retinal vein occlusion, right eye, stable: Secondary | ICD-10-CM

## 2022-03-08 ENCOUNTER — Ambulatory Visit: Payer: Medicare Other | Admitting: Occupational Therapy

## 2022-03-08 ENCOUNTER — Ambulatory Visit: Payer: Medicare Other

## 2022-03-08 DIAGNOSIS — R471 Dysarthria and anarthria: Secondary | ICD-10-CM | POA: Diagnosis not present

## 2022-03-08 DIAGNOSIS — R1310 Dysphagia, unspecified: Secondary | ICD-10-CM

## 2022-03-08 DIAGNOSIS — M6281 Muscle weakness (generalized): Secondary | ICD-10-CM | POA: Diagnosis not present

## 2022-03-08 DIAGNOSIS — R29818 Other symptoms and signs involving the nervous system: Secondary | ICD-10-CM | POA: Diagnosis not present

## 2022-03-08 DIAGNOSIS — R2681 Unsteadiness on feet: Secondary | ICD-10-CM | POA: Diagnosis not present

## 2022-03-08 DIAGNOSIS — R278 Other lack of coordination: Secondary | ICD-10-CM | POA: Diagnosis not present
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
## 2022-03-13 ENCOUNTER — Ambulatory Visit: Payer: Medicare Other | Admitting: Occupational Therapy

## 2022-03-13 DIAGNOSIS — M6281 Muscle weakness (generalized): Secondary | ICD-10-CM

## 2022-03-13 DIAGNOSIS — R278 Other lack of coordination: Secondary | ICD-10-CM

## 2022-03-13 DIAGNOSIS — R29818 Other symptoms and signs involving the nervous system: Secondary | ICD-10-CM | POA: Diagnosis not present

## 2022-03-13 DIAGNOSIS — R2681 Unsteadiness on feet: Secondary | ICD-10-CM | POA: Diagnosis not present

## 2022-03-13 DIAGNOSIS — R1310 Dysphagia, unspecified: Secondary | ICD-10-CM | POA: Diagnosis not present

## 2022-03-13 DIAGNOSIS — R471 Dysarthria and anarthria: Secondary | ICD-10-CM | POA: Diagnosis not present
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
## 2022-03-16 ENCOUNTER — Ambulatory Visit: Payer: Medicare Other

## 2022-03-16 ENCOUNTER — Ambulatory Visit: Payer: Medicare Other | Admitting: Occupational Therapy

## 2022-03-16 DIAGNOSIS — R2681 Unsteadiness on feet: Secondary | ICD-10-CM | POA: Diagnosis not present

## 2022-03-16 DIAGNOSIS — R1310 Dysphagia, unspecified: Secondary | ICD-10-CM | POA: Diagnosis not present

## 2022-03-16 DIAGNOSIS — R278 Other lack of coordination: Secondary | ICD-10-CM

## 2022-03-16 DIAGNOSIS — R29818 Other symptoms and signs involving the nervous system: Secondary | ICD-10-CM

## 2022-03-16 DIAGNOSIS — M6281 Muscle weakness (generalized): Secondary | ICD-10-CM

## 2022-03-16 DIAGNOSIS — R471 Dysarthria and anarthria: Secondary | ICD-10-CM
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
## 2022-03-19 ENCOUNTER — Ambulatory Visit: Payer: Medicare Other

## 2022-03-19 DIAGNOSIS — R1310 Dysphagia, unspecified: Secondary | ICD-10-CM | POA: Diagnosis not present

## 2022-03-19 DIAGNOSIS — R278 Other lack of coordination: Secondary | ICD-10-CM | POA: Diagnosis not present

## 2022-03-19 DIAGNOSIS — R471 Dysarthria and anarthria: Secondary | ICD-10-CM

## 2022-03-19 DIAGNOSIS — R2681 Unsteadiness on feet: Secondary | ICD-10-CM | POA: Diagnosis not present

## 2022-03-19 DIAGNOSIS — R29818 Other symptoms and signs involving the nervous system: Secondary | ICD-10-CM | POA: Diagnosis not present

## 2022-03-19 DIAGNOSIS — M6281 Muscle weakness (generalized): Secondary | ICD-10-CM | POA: Diagnosis not present
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
## 2022-03-21 ENCOUNTER — Ambulatory Visit: Payer: Medicare Other

## 2022-03-21 DIAGNOSIS — M6281 Muscle weakness (generalized): Secondary | ICD-10-CM | POA: Diagnosis not present

## 2022-03-21 DIAGNOSIS — R1310 Dysphagia, unspecified: Secondary | ICD-10-CM | POA: Diagnosis not present

## 2022-03-21 DIAGNOSIS — R29818 Other symptoms and signs involving the nervous system: Secondary | ICD-10-CM | POA: Diagnosis not present

## 2022-03-21 DIAGNOSIS — R471 Dysarthria and anarthria: Secondary | ICD-10-CM | POA: Diagnosis not present

## 2022-03-21 DIAGNOSIS — R278 Other lack of coordination: Secondary | ICD-10-CM | POA: Diagnosis not present

## 2022-03-21 DIAGNOSIS — R2681 Unsteadiness on feet: Secondary | ICD-10-CM | POA: Diagnosis not present
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
## 2022-03-26 DIAGNOSIS — E785 Hyperlipidemia, unspecified: Secondary | ICD-10-CM | POA: Diagnosis not present

## 2022-03-26 DIAGNOSIS — E559 Vitamin D deficiency, unspecified: Secondary | ICD-10-CM | POA: Diagnosis not present

## 2022-03-26 DIAGNOSIS — I700 Atherosclerosis of aorta: Secondary | ICD-10-CM | POA: Diagnosis not present

## 2022-03-26 DIAGNOSIS — R7989 Other specified abnormal findings of blood chemistry: Secondary | ICD-10-CM | POA: Diagnosis not present

## 2022-03-27 ENCOUNTER — Ambulatory Visit: Payer: Medicare Other

## 2022-03-27 DIAGNOSIS — R1310 Dysphagia, unspecified: Secondary | ICD-10-CM | POA: Diagnosis not present

## 2022-03-27 DIAGNOSIS — R471 Dysarthria and anarthria: Secondary | ICD-10-CM | POA: Diagnosis not present

## 2022-03-27 DIAGNOSIS — R278 Other lack of coordination: Secondary | ICD-10-CM | POA: Diagnosis not present

## 2022-03-27 DIAGNOSIS — R2681 Unsteadiness on feet: Secondary | ICD-10-CM | POA: Diagnosis not present

## 2022-03-27 DIAGNOSIS — M6281 Muscle weakness (generalized): Secondary | ICD-10-CM | POA: Diagnosis not present

## 2022-03-27 DIAGNOSIS — R29818 Other symptoms and signs involving the nervous system: Secondary | ICD-10-CM | POA: Diagnosis not present
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
## 2022-03-29 ENCOUNTER — Ambulatory Visit: Payer: Medicare Other

## 2022-03-29 DIAGNOSIS — M6281 Muscle weakness (generalized): Secondary | ICD-10-CM | POA: Diagnosis not present

## 2022-03-29 DIAGNOSIS — R1310 Dysphagia, unspecified: Secondary | ICD-10-CM | POA: Diagnosis not present

## 2022-03-29 DIAGNOSIS — R29818 Other symptoms and signs involving the nervous system: Secondary | ICD-10-CM | POA: Diagnosis not present

## 2022-03-29 DIAGNOSIS — R2681 Unsteadiness on feet: Secondary | ICD-10-CM | POA: Diagnosis not present

## 2022-03-29 DIAGNOSIS — R471 Dysarthria and anarthria: Secondary | ICD-10-CM | POA: Diagnosis not present

## 2022-03-29 DIAGNOSIS — R278 Other lack of coordination: Secondary | ICD-10-CM | POA: Diagnosis not present
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
## 2022-04-02 DIAGNOSIS — Z0000 Encounter for general adult medical examination without abnormal findings: Secondary | ICD-10-CM | POA: Diagnosis not present

## 2022-04-02 DIAGNOSIS — E559 Vitamin D deficiency, unspecified: Secondary | ICD-10-CM | POA: Diagnosis not present

## 2022-04-02 DIAGNOSIS — I480 Paroxysmal atrial fibrillation: Secondary | ICD-10-CM | POA: Diagnosis not present

## 2022-04-02 DIAGNOSIS — I341 Nonrheumatic mitral (valve) prolapse: Secondary | ICD-10-CM | POA: Diagnosis not present

## 2022-04-02 DIAGNOSIS — D692 Other nonthrombocytopenic purpura: Secondary | ICD-10-CM | POA: Diagnosis not present

## 2022-04-02 DIAGNOSIS — I5042 Chronic combined systolic (congestive) and diastolic (congestive) heart failure: Secondary | ICD-10-CM | POA: Diagnosis not present

## 2022-04-02 DIAGNOSIS — Z23 Encounter for immunization: Secondary | ICD-10-CM | POA: Diagnosis not present

## 2022-04-02 DIAGNOSIS — R82998 Other abnormal findings in urine: Secondary | ICD-10-CM | POA: Diagnosis not present

## 2022-04-02 DIAGNOSIS — G20A1 Parkinson's disease without dyskinesia, without mention of fluctuations: Secondary | ICD-10-CM | POA: Diagnosis not present

## 2022-04-02 DIAGNOSIS — Z7901 Long term (current) use of anticoagulants: Secondary | ICD-10-CM | POA: Diagnosis not present

## 2022-04-02 DIAGNOSIS — I700 Atherosclerosis of aorta: Secondary | ICD-10-CM | POA: Diagnosis not present

## 2022-04-02 DIAGNOSIS — I2510 Atherosclerotic heart disease of native coronary artery without angina pectoris: Secondary | ICD-10-CM | POA: Diagnosis not present

## 2022-04-02 DIAGNOSIS — E785 Hyperlipidemia, unspecified: Secondary | ICD-10-CM | POA: Diagnosis not present

## 2022-04-02 DIAGNOSIS — M8589 Other specified disorders of bone density and structure, multiple sites: Secondary | ICD-10-CM | POA: Diagnosis not present

## 2022-04-03 ENCOUNTER — Ambulatory Visit: Payer: Medicare Other | Attending: Neurology

## 2022-04-03 DIAGNOSIS — R471 Dysarthria and anarthria: Secondary | ICD-10-CM | POA: Diagnosis not present

## 2022-04-03 DIAGNOSIS — R1310 Dysphagia, unspecified: Secondary | ICD-10-CM | POA: Insufficient documentation
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
## 2022-04-05 ENCOUNTER — Ambulatory Visit: Payer: Medicare Other

## 2022-04-05 DIAGNOSIS — R1310 Dysphagia, unspecified: Secondary | ICD-10-CM

## 2022-04-05 DIAGNOSIS — R471 Dysarthria and anarthria: Secondary | ICD-10-CM
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
## 2022-04-10 ENCOUNTER — Ambulatory Visit: Payer: Medicare Other

## 2022-04-10 DIAGNOSIS — R1310 Dysphagia, unspecified: Secondary | ICD-10-CM | POA: Diagnosis not present

## 2022-04-10 DIAGNOSIS — R471 Dysarthria and anarthria: Secondary | ICD-10-CM | POA: Diagnosis not present
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
## 2022-04-12 ENCOUNTER — Other Ambulatory Visit (HOSPITAL_COMMUNITY): Payer: Self-pay

## 2022-04-12 ENCOUNTER — Ambulatory Visit: Payer: Medicare Other

## 2022-04-12 DIAGNOSIS — R471 Dysarthria and anarthria: Secondary | ICD-10-CM

## 2022-04-12 DIAGNOSIS — R1310 Dysphagia, unspecified: Secondary | ICD-10-CM | POA: Diagnosis not present
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
## 2022-04-13 ENCOUNTER — Ambulatory Visit (HOSPITAL_COMMUNITY)
Admission: RE | Admit: 2022-04-13 | Discharge: 2022-04-13 | Disposition: A | Discharge status: Home / Self Care | Payer: Medicare Other | Source: Ambulatory Visit | Attending: Internal Medicine | Admitting: Internal Medicine

## 2022-04-13 DIAGNOSIS — M810 Age-related osteoporosis without current pathological fracture: Secondary | ICD-10-CM | POA: Insufficient documentation

## 2022-04-13 MED ORDER — DENOSUMAB 60 MG/ML ~~LOC~~ SOSY
60.0000 mg | PREFILLED_SYRINGE | Freq: Once | SUBCUTANEOUS | Status: AC
  Administered 2022-04-13: 60 mg via SUBCUTANEOUS

## 2022-04-13 MED ORDER — DENOSUMAB 60 MG/ML ~~LOC~~ SOSY
PREFILLED_SYRINGE | SUBCUTANEOUS | Status: AC
  Filled 2022-04-13: qty 1

## 2022-04-17 ENCOUNTER — Ambulatory Visit: Payer: Medicare Other

## 2022-04-17 DIAGNOSIS — R471 Dysarthria and anarthria: Secondary | ICD-10-CM | POA: Diagnosis not present

## 2022-04-17 DIAGNOSIS — R1310 Dysphagia, unspecified: Secondary | ICD-10-CM | POA: Diagnosis not present
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
## 2022-04-24 ENCOUNTER — Ambulatory Visit: Payer: Medicare Other

## 2022-04-24 DIAGNOSIS — R471 Dysarthria and anarthria: Secondary | ICD-10-CM

## 2022-04-24 DIAGNOSIS — R1310 Dysphagia, unspecified: Secondary | ICD-10-CM
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
## 2022-04-26 ENCOUNTER — Ambulatory Visit: Payer: Medicare Other

## 2022-04-26 DIAGNOSIS — R471 Dysarthria and anarthria: Secondary | ICD-10-CM

## 2022-04-26 DIAGNOSIS — R1310 Dysphagia, unspecified: Secondary | ICD-10-CM
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
## 2022-05-04 ENCOUNTER — Ambulatory Visit
Admission: RE | Admit: 2022-05-04 | Discharge: 2022-05-04 | Disposition: A | Discharge status: Home / Self Care | Payer: Medicare Other | Source: Ambulatory Visit | Attending: Internal Medicine | Admitting: Internal Medicine

## 2022-05-04 DIAGNOSIS — Z1231 Encounter for screening mammogram for malignant neoplasm of breast: Secondary | ICD-10-CM | POA: Diagnosis not present

## 2022-05-07 ENCOUNTER — Other Ambulatory Visit: Payer: Self-pay | Admitting: Internal Medicine

## 2022-05-07 DIAGNOSIS — R928 Other abnormal and inconclusive findings on diagnostic imaging of breast: Secondary | ICD-10-CM

## 2022-05-14 ENCOUNTER — Ambulatory Visit
Admission: RE | Admit: 2022-05-14 | Discharge: 2022-05-14 | Disposition: A | Discharge status: Home / Self Care | Payer: Medicare Other | Source: Ambulatory Visit | Attending: Internal Medicine | Admitting: Internal Medicine

## 2022-05-14 DIAGNOSIS — N6489 Other specified disorders of breast: Secondary | ICD-10-CM | POA: Diagnosis not present

## 2022-05-14 DIAGNOSIS — R928 Other abnormal and inconclusive findings on diagnostic imaging of breast: Secondary | ICD-10-CM

## 2022-05-24 DIAGNOSIS — H401221 Low-tension glaucoma, left eye, mild stage: Secondary | ICD-10-CM | POA: Diagnosis not present

## 2022-05-24 DIAGNOSIS — H401212 Low-tension glaucoma, right eye, moderate stage: Secondary | ICD-10-CM | POA: Diagnosis not present

## 2022-05-24 DIAGNOSIS — H04123 Dry eye syndrome of bilateral lacrimal glands: Secondary | ICD-10-CM | POA: Diagnosis not present

## 2022-05-24 DIAGNOSIS — H1045 Other chronic allergic conjunctivitis: Secondary | ICD-10-CM | POA: Diagnosis not present

## 2022-05-30 ENCOUNTER — Encounter (INDEPENDENT_AMBULATORY_CARE_PROVIDER_SITE_OTHER): Payer: Medicare Other | Admitting: Ophthalmology

## 2022-05-30 ENCOUNTER — Ambulatory Visit: Payer: Medicare Other | Admitting: Surgery

## 2022-06-04 ENCOUNTER — Other Ambulatory Visit: Payer: Self-pay | Admitting: Internal Medicine

## 2022-06-04 DIAGNOSIS — I4819 Other persistent atrial fibrillation: Secondary | ICD-10-CM

## 2022-06-04 NOTE — Telephone Encounter (Signed)
Prescription refill request for Eliquis received. Indication:afib Last office visit:7/23 Scr:  0.7 Age: 87 Weight:55.8  kg  Prescription refilled

## 2022-06-05 ENCOUNTER — Other Ambulatory Visit: Payer: Self-pay | Admitting: Surgery

## 2022-06-05 DIAGNOSIS — R918 Other nonspecific abnormal finding of lung field: Secondary | ICD-10-CM

## 2022-06-06 ENCOUNTER — Ambulatory Visit
Admission: RE | Admit: 2022-06-06 | Discharge: 2022-06-06 | Disposition: A | Discharge status: Home / Self Care | Payer: Medicare Other | Source: Ambulatory Visit | Attending: Surgery | Admitting: Surgery

## 2022-06-06 ENCOUNTER — Encounter: Payer: Self-pay | Admitting: Surgery

## 2022-06-06 ENCOUNTER — Ambulatory Visit (INDEPENDENT_AMBULATORY_CARE_PROVIDER_SITE_OTHER): Payer: Medicare Other | Admitting: Surgery

## 2022-06-06 VITALS — BP 128/73 | HR 72 | Resp 18 | Ht 65.0 in | Wt 130.0 lb

## 2022-06-06 DIAGNOSIS — Z86018 Personal history of other benign neoplasm: Secondary | ICD-10-CM

## 2022-06-06 DIAGNOSIS — R918 Other nonspecific abnormal finding of lung field: Secondary | ICD-10-CM

## 2022-06-06 NOTE — Progress Notes (Unsigned)
     HPI: ***  Current Outpatient Medications  Medication Sig Dispense Refill   acetaminophen (TYLENOL) 500 MG tablet Take 500-1,000 mg by mouth every 6 (six) hours as needed for moderate pain.     Biotin 5000 MCG TABS Take 5,000 mcg by mouth daily.     Calcium Carb-Cholecalciferol (CALTRATE 600+D3 PO) Take 1 tablet by mouth daily.     carbidopa-levodopa (SINEMET IR) 25-100 MG tablet Take 1 tablet by mouth 3 (three) times daily. 7am/11am/4pm 270 tablet 1   cholecalciferol (VITAMIN D3) 25 MCG (1000 UNIT) tablet Take 1,000 Units by mouth daily.     colchicine 0.6 MG tablet Take 0.6 mg by mouth daily as needed.     denosumab (PROLIA) 60 MG/ML SOSY injection Inject 60 mg into the skin every 6 (six) months.     docusate sodium 100 MG CAPS Take 100 mg by mouth 2 (two) times daily. 10 capsule 0   ELIQUIS 2.5 MG TABS tablet TAKE ONE TABLET TWICE DAILY 180 tablet 1   FARXIGA 10 MG TABS tablet Take 10 mg by mouth daily.     fluticasone (FLONASE) 50 MCG/ACT nasal spray Place 1 spray into both nostrils daily as needed for allergies.     furosemide (LASIX) 20 MG tablet Take 1 tablet by mouth daily x 3 days then every other day x 5 doses. (Patient taking differently: Take 1 tablet by mouth daily x 3 days then every other day x 5 doses   Take As needed) 15 tablet 0   glucosamine-chondroitin 500-400 MG tablet Take 1 tablet by mouth 3 (three) times daily.     loratadine (CLARITIN) 10 MG tablet Take 10 mg by mouth daily.     Omega-3 Fatty Acids (FISH OIL) 1200 MG CAPS Take 1,200 mg by mouth daily.     sodium chloride (OCEAN) 0.65 % SOLN nasal spray Place 1 spray into both nostrils as needed for congestion.     No current facility-administered medications for this visit.     Physical Exam: ***  Diagnostic Tests: ***  Impression: ***  Plan: ***   Gaye Pollack, MD [facility] [phone]

## 2022-06-12 ENCOUNTER — Encounter (INDEPENDENT_AMBULATORY_CARE_PROVIDER_SITE_OTHER): Payer: Medicare Other | Admitting: Ophthalmology

## 2022-06-12 DIAGNOSIS — H348112 Central retinal vein occlusion, right eye, stable: Secondary | ICD-10-CM

## 2022-06-12 DIAGNOSIS — D3132 Benign neoplasm of left choroid: Secondary | ICD-10-CM | POA: Diagnosis not present

## 2022-06-12 DIAGNOSIS — H43813 Vitreous degeneration, bilateral: Secondary | ICD-10-CM

## 2022-06-19 ENCOUNTER — Ambulatory Visit: Payer: Medicare Other | Attending: Internal Medicine | Admitting: Internal Medicine

## 2022-06-19 ENCOUNTER — Encounter: Payer: Self-pay | Admitting: Internal Medicine

## 2022-06-19 VITALS — BP 112/56 | HR 78 | Ht 65.0 in | Wt 129.0 lb

## 2022-06-19 DIAGNOSIS — I447 Left bundle-branch block, unspecified: Secondary | ICD-10-CM | POA: Insufficient documentation

## 2022-06-19 DIAGNOSIS — I4819 Other persistent atrial fibrillation: Secondary | ICD-10-CM | POA: Diagnosis not present

## 2022-06-19 DIAGNOSIS — I5032 Chronic diastolic (congestive) heart failure: Secondary | ICD-10-CM | POA: Insufficient documentation

## 2022-06-19 MED ORDER — PROPRANOLOL HCL 10 MG PO TABS: 10.0000 mg | ORAL_TABLET | ORAL | 1 refills | Status: DC | PRN

## 2022-06-19 NOTE — Progress Notes (Signed)
Patient Care Team: Shon Baton, MD as PCP - General (Internal Medicine) Deboraha Sprang, MD as PCP - Cardiology (Cardiology) Gaye Pollack, MD (Cardiothoracic Surgery) Shon Baton, MD as Consulting Physician (Internal Medicine) Tat, Eustace Quail, DO as Consulting Physician (Neurology)  HPI  [name] is a 87 y.o. female Seen in followup for chief complaint of palpitations  associated w dizziness and fatigue History of chest pain but none recently.  Stress echo demonstrated no echocardiographic changes; images were poor.   Event recorder>>atrial fibrillation. anticoagulation with apixoban,    Was to undergo cardioversion.  Appeared 12/22 in sinus rhythm.  Multiple efforts of been undertaken to try to discern whether symptoms are associated with her paroxysms of atrial fibrillation, whether they are associated with variable rates in atrial fibrillation or unrelated to her A-fib at all.  There is no significant improvement with cardioversion, there been no significant awareness of atrial fibrillation at her last visit 1/23.  Has had episodic dyspnea.  Felt really crummy earlier this week with a sensation that her heart was out of rhythm.  Still somewhat today.  (In sinus today)  Also with shortness of breath.  Has developed a tremor in her right hand, concerning for Parkinson's in her mind.  Anxious about her husband's deteriorating condition as well and who will outlive whom.    Wt Readings from Last 3 Encounters:  06/19/22 129 lb (58.5 kg)  06/06/22 130 lb (59 kg)  01/22/22 123 lb (55.8 kg)       DATE TEST EF   4/13 Echo   55-65 %   4/15 Echo  55-65%   6/21 Echo 40-45%   2/22 Echo 45-50%     Date Cr K Hgb  1/16 0.67  11.2  7/18 0.92  15.0  10/18   15.4   10/19 0.9 4.9 15.1  11/20 0.8 5.3 14.9  5/22 (CE) 0.8 5.0           Past Medical History:  Diagnosis Date   Allergic rhinitis    Arthritis    Atrial fibrillation (HCC)    BCC (basal cell carcinoma)     Benign tumor of pleura 2007   Bursitis    Bursitis of shoulder    Cataracts, bilateral    Complication of anesthesia    Cough since 05-09-2014   with sore throat   Diastolic dysfunction    Fracture    at l1   Fracture    right arm x 2   Glaucoma (increased eye pressure)    Hearing loss    History of blood transfusion age 38 or 8   Hyperlipidemia    IBS (irritable bowel syndrome)    Insomnia    Lung tumor (benign)    OA (osteoarthritis)    Osteopenia    Paroxysmal atrial fibrillation (HCC)    PONV (postoperative nausea and vomiting) 48 yrs ago   with ether   Sarcoidosis    As a child   Seasonal allergies    Tortuous aortic arch     Past Surgical History:  Procedure Laterality Date   ABDOMINAL HYSTERECTOMY     complete   APPENDECTOMY     FRACTURE SURGERY Right 2007   Left video-assisted thoracoscopy, left thoracotomy with wedge resection of left lower lobe lung mass.  Insertion of On-Q pain pump  12/03/2005   [facility]   ROTATOR CUFF REPAIR Left 2008   TONSILLECTOMY  as child   TOTAL HIP ARTHROPLASTY  Right 05/18/2014   Procedure: RIGHT TOTAL HIP ARTHROPLASTY ANTERIOR APPROACH;  Surgeon: [name], MD;  Location: [facility];  Service: Orthopedics;  Laterality: Right;   VAGINAL HYSTERECTOMY     WRIST SURGERY      Current Outpatient Medications  Medication Sig Dispense Refill   acetaminophen (TYLENOL) 500 MG tablet Take 500-1,000 mg by mouth every 6 (six) hours as needed for moderate pain.     Biotin 5000 MCG TABS Take 5,000 mcg by mouth daily.     Calcium Carb-Cholecalciferol (CALTRATE 600+D3 PO) Take 1 tablet by mouth daily.     carbidopa-levodopa (SINEMET IR) 25-100 MG tablet Take 1 tablet by mouth 3 (three) times daily. 7am/11am/4pm 270 tablet 1   cholecalciferol (VITAMIN D3) 25 MCG (1000 UNIT) tablet Take 1,000 Units by mouth daily.     colchicine 0.6 MG tablet Take 0.6 mg by mouth daily as needed.     denosumab (PROLIA) 60 MG/ML SOSY injection Inject 60 mg into the  skin every 6 (six) months.     ELIQUIS 2.5 MG TABS tablet TAKE ONE TABLET TWICE DAILY 180 tablet 1   FARXIGA 10 MG TABS tablet Take 10 mg by mouth daily.     fluticasone (FLONASE) 50 MCG/ACT nasal spray Place 1 spray into both nostrils daily as needed for allergies.     furosemide (LASIX) 20 MG tablet Take 1 tablet by mouth daily x 3 days then every other day x 5 doses. (Patient taking differently: Take As needed for swelling) 15 tablet 0   glucosamine-chondroitin 500-400 MG tablet Take 1 tablet by mouth 3 (three) times daily.     loratadine (CLARITIN) 10 MG tablet Take 10 mg by mouth daily.     sodium chloride (OCEAN) 0.65 % SOLN nasal spray Place 1 spray into both nostrils as needed for congestion.     docusate sodium 100 MG CAPS Take 100 mg by mouth 2 (two) times daily. 10 capsule 0   No current facility-administered medications for this visit.    Allergies  Allergen Reactions   Alendronate Swelling   Fosamax [Alendronate Sodium] Swelling    Joint swelling   Poison Ivy Extract     Severe hives, needed steroid taper last time   Trazodone And Nefazodone     Nightmares    Review of Systems negative except from HPI and PMH  Physical Exam BP (!) 112/56   Pulse 78   Ht 5' 5"$  (1.651 m)   Wt 129 lb (58.5 kg)   SpO2 95%   BMI 21.47 kg/m  Well developed and nourished in no acute distress HENT normal Neck supple with JVP-  flat   Clear Regular rate and rhythm, no murmurs or gallops Abd-soft with active BS No Clubbing cyanosis tr edema Skin-warm and dry A & Oriented  Grossly normal sensory and motor function  ECG coarse atrial flutter with a ventricular rate of 78 Intervals-/11/41 I am not entirely convinced that this is in atrial flutter and not sinus rhythm.  But she did have tachycardia the other day   sinus at 68 Interval 20/12/44 1/23 >> atrial fibrillation at 88  12/22 >>NSR 12/22  AFIB 5/22 >> NSR 6/21 >> NSR  Both Personally reviewed    Assessment and   Plan  Mitral valve prolapse  Persistent atrial fibrillation   HFpEF   PVC quiescient  IVCD  Parkinson's    Recurrent atrial arrhythmias.  Infrequently does go fast.  Will give her prescription for Inderal 10 to  take as needed.  No bleeding, we will continue her on her Eliquis.  Blood pressure runs low preventing the use of chronic rate control.  Just trace edema, it is nonpitting.  Continue to use as needed Lasix.  I described for her pitting.  Challenges remain with her husband's dementia

## 2022-06-19 NOTE — Patient Instructions (Addendum)
Medication Instructions:  Your physician has recommended you make the following change in your medication:   ** Begin Inderal 17m - 1 tablet by mouth every 4 hours as needed for fast heart rate.   *If you need a refill on your cardiac medications before your next appointment, please call your pharmacy*   Lab Work: None ordered.  If you have labs (blood work) drawn today and your tests are completely normal, you will receive your results only by: [place](if you have MyChart) OR A paper copy in the mail If you have any lab test that is abnormal or we need to change your treatment, we will call you to review the results.   Testing/Procedures: None ordered.    Follow-Up: [facility] and your health needs are our priority.  As part of our continuing mission to provide you with exceptional heart care, we have created designated Provider Care Teams.  These Care Teams include your primary Cardiologist (physician) and Advanced Practice Providers (APPs -  Physician Assistants and Nurse Practitioners) who all work together to provide you with the care you need, when you need it.  We recommend signing up for the patient portal called "MyChart".  Sign up information is provided on this After Visit Summary.  MyChart is used to connect with patients for Virtual Visits (Telemedicine).  Patients are able to view lab/test results, encounter notes, upcoming appointments, etc.  Non-urgent messages can be sent to your provider as well.   To learn more about what you can do with MyChart, go to [email or web address]    Your next appointment:   6 months with Dr KCaryl Comes

## 2022-06-19 NOTE — Addendum Note (Signed)
Addended by: Thora Lance on: 06/19/2022 06:18 PM   Modules accepted: Orders

## 2022-07-02 ENCOUNTER — Other Ambulatory Visit: Payer: Self-pay | Admitting: Neurology

## 2022-07-05 NOTE — Progress Notes (Deleted)
Assessment/Plan:   1.  Parkinsons Disease  -***  2.  Recurrent atrial arrhythmias  -On Inderal as needed by cardiology.  -On apixaban Subjective:   [name] was seen today in follow up for Parkinsons disease.  My previous records were reviewed prior to todays visit as well as outside records available to me.  Patient diagnosed with Parkinson's last visit and started on levodopa.  She is tolerating the medication well.  Pt denies falls.  She has been to therapies (physical/occupational/speech therapies) since last visit and notes are reviewed.  Pt denies lightheadedness, near syncope.  No hallucinations.  Mood has been good.  MRI brain was ordered last visit and completed on January 07, 2022.  I personally reviewed it.  There was very little white matter disease (few punctate T2 hyperintensities).  She did see cardiology February 20.  Dr. Caryl Comes started her on propranolol, 10 milligrams, 1 tablet every 4 hours as needed for tachycardia.  Current prescribed movement disorder medications: ***Carbidopa/levodopa 25/100, 1 tablet 3 times per day (started last visit)   PREVIOUS MEDICATIONS: {Parkinson's RX:18200}  ALLERGIES:   Allergies  Allergen Reactions   Alendronate Swelling   Fosamax [Alendronate Sodium] Swelling    Joint swelling   Poison Ivy Extract     Severe hives, needed steroid taper last time   Trazodone And Nefazodone     Nightmares    CURRENT MEDICATIONS:  No outpatient medications have been marked as taking for the 07/09/22 encounter (Appointment) with [name], Andrea Quail, DO.     Objective:   PHYSICAL EXAMINATION:    VITALS:  There were no vitals filed for this visit.  GEN:  The patient appears stated age and is in NAD. HEENT:  Normocephalic, atraumatic.  The mucous membranes are moist. The superficial temporal arteries are without ropiness or tenderness. CV:  RRR Lungs:  CTAB Neck/HEME:  There are no carotid bruits bilaterally.  Neurological  examination:  Orientation: The patient is alert and oriented x3. Cranial nerves: There is good facial symmetry with*** facial hypomimia. The speech is fluent and clear. Soft palate rises symmetrically and there is no tongue deviation. Hearing is intact to conversational tone. Sensation: Sensation is intact to light touch throughout Motor: Strength is at least antigravity x4.  Movement examination: Tone: There is mild increased tone in the RUE/RLE.  Tone is normal on the left. Abnormal movements: there is RUE rest tremor that slightly increases with distraction Coordination:  There is mild decremation with RAM's, with any form of RAMS, including alternating supination and pronation of the forearm, hand opening and closing, finger taps, heel taps and toe taps on the R Gait and Station: The patient has mild difficulty arising out of a deep-seated chair without the use of the hands. The patient's stride length is decreased.  She shuffles and has almost no arm swing bilaterally.    I have reviewed and interpreted the following labs independently    Chemistry      Component Value Date/Time   NA 142 11/19/2016 1001   K 4.9 11/19/2016 1001   CL 102 11/19/2016 1001   CO2 28 11/19/2016 1001   BUN 30 (H) 11/19/2016 1001   CREATININE 0.92 11/19/2016 1001      Component Value Date/Time   CALCIUM 10.1 11/19/2016 1001       Lab Results  Component Value Date   WBC 6.8 11/19/2016   HGB 15.0 11/19/2016   HCT 43.1 11/19/2016   MCV 85 11/19/2016  PLT 193 11/19/2016    No results found for: "TSH"   Total time spent on today's visit was ***30 minutes, including both face-to-face time and nonface-to-face time.  Time included that spent on review of records (prior notes available to me/labs/imaging if pertinent), discussing treatment and goals, answering patient's questions and coordinating care.  Cc:  Shon Baton, MD

## 2022-07-09 ENCOUNTER — Ambulatory Visit: Payer: Medicare Other | Admitting: Neurology

## 2022-07-09 ENCOUNTER — Encounter: Payer: Self-pay | Admitting: Neurology

## 2022-07-10 ENCOUNTER — Ambulatory Visit: Payer: Medicare Other | Admitting: Neurology

## 2022-07-20 NOTE — Progress Notes (Unsigned)
Assessment/Plan:   1.  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  We discussed that it used to be thought that levodopa would increase risk of melanoma but now it is believed that Parkinsons itself likely increases risk of melanoma. she is to get regular skin checks.  -Patient is doing [employer] moves class.  Told her I was proud of her for that.  2.  Recurrent atrial arrhythmias  -On Inderal as needed by cardiology.  -On apixaban Subjective:   [name] was seen today in follow up for Parkinsons disease.  My previous records were reviewed prior to todays visit as well as outside records available to me.  Patient diagnosed with Parkinson's last visit and started on levodopa.  She is tolerating the medication well.  She does have nausea with it if takes it on empty stomach but if takes it with fruit, she does well.  Pt denies falls.  She has been to therapies (physical/occupational/speech therapies) since last visit and notes are reviewed. She knows that she can write better but isn't otherwise sure if med helps.   Pt denies lightheadedness, near syncope.  No hallucinations.  Mood has been good.  She gets cramping of the legs 2-3 nights/week.  It was bad a few nights ago.  MRI brain was ordered last visit and completed on January 07, 2022.  I personally reviewed it.  There was very little white matter disease (few punctate T2 hyperintensities).  She did see cardiology February 20.  Dr. Caryl Comes started her on propranolol, 10 mg, 1 tablet every 4 hours as needed for tachycardia.  Current prescribed movement disorder medications: Carbidopa/levodopa 25/100, 1 tablet 3 times per day (started last visit)   ALLERGIES:   Allergies  Allergen Reactions   Alendronate Swelling   Fosamax [Alendronate Sodium] Swelling    Joint  swelling   Poison Ivy Extract     Severe hives, needed steroid taper last time   Trazodone And Nefazodone     Nightmares    CURRENT MEDICATIONS:  Current Meds  Medication Sig   acetaminophen (TYLENOL) 500 MG tablet Take 500-1,000 mg by mouth every 6 (six) hours as needed for moderate pain.   Biotin 5000 MCG TABS Take 5,000 mcg by mouth daily.   Calcium Carb-Cholecalciferol (CALTRATE 600+D3 PO) Take 1 tablet by mouth daily.   carbidopa-levodopa (SINEMET IR) 25-100 MG tablet TAKE ONE TABLET THREE TIMES DAILY AT 7AM 11AM 4PM   cholecalciferol (VITAMIN D3) 25 MCG (1000 UNIT) tablet Take 1,000 Units by mouth daily.   colchicine 0.6 MG tablet Take 0.6 mg by mouth daily as needed.   denosumab (PROLIA) 60 MG/ML SOSY injection Inject 60 mg into the skin every 6 (six) months.   docusate sodium 100 MG CAPS Take 100 mg by mouth 2 (two) times daily.   ELIQUIS 2.5 MG TABS tablet TAKE ONE TABLET TWICE DAILY   FARXIGA 10 MG TABS tablet Take 10 mg by mouth daily.   fluticasone (FLONASE) 50 MCG/ACT nasal spray Place 1 spray into both nostrils daily as needed for allergies.   furosemide (LASIX) 20 MG tablet Take 1 tablet by mouth daily x 3 days then every other day x 5 doses. (Patient taking differently: Take As needed for swelling)   glucosamine-chondroitin 500-400 MG tablet Take 1 tablet by mouth 3 (three)  times daily.   loratadine (CLARITIN) 10 MG tablet Take 10 mg by mouth daily.   propranolol (INDERAL) 10 MG tablet Take 1 tablet (10 mg total) by mouth every 4 (four) hours as needed.   sodium chloride (OCEAN) 0.65 % SOLN nasal spray Place 1 spray into both nostrils as needed for congestion.     Objective:   PHYSICAL EXAMINATION:    VITALS:   Vitals:   07/23/22 1344  BP: 118/72  Pulse: 87  SpO2: 99%  Weight: 128 lb 9.6 oz (58.3 kg)  Height: 5\' 4"  (1.626 m)    GEN:  The patient appears stated age and is in NAD. HEENT:  Normocephalic, atraumatic.  The mucous membranes are moist. The  superficial temporal arteries are without ropiness or tenderness. CV:  RRR Lungs:  CTAB Neck/HEME:  There are no carotid bruits bilaterally.  Neurological examination:  Orientation: The patient is alert and oriented x3. Cranial nerves: There is good facial symmetry with min facial hypomimia. The speech is fluent and clear. Soft palate rises symmetrically and there is no tongue deviation. Hearing is intact to conversational tone. Sensation: Sensation is intact to light touch throughout Motor: Strength is at least antigravity x4.  Movement examination: Tone: There is nl tone in the UE/LE Abnormal movements: there is mild tremor in the RUE, intermittent Coordination:  There is no decremation with any form of RAMS, including alternating supination and pronation of the forearm, hand opening and closing, finger taps, heel taps and toe taps.  Gait and Station: The patient pushes off to arise.  The patient's stride length is purposeful with marching.  She has decreased arm swing on the R  I have reviewed and interpreted the following labs independently    Chemistry      Component Value Date/Time   NA 142 11/19/2016 1001   K 4.9 11/19/2016 1001   CL 102 11/19/2016 1001   CO2 28 11/19/2016 1001   BUN 30 (H) 11/19/2016 1001   CREATININE 0.92 11/19/2016 1001      Component Value Date/Time   CALCIUM 10.1 11/19/2016 1001       Lab Results  Component Value Date   WBC 6.8 11/19/2016   HGB 15.0 11/19/2016   HCT 43.1 11/19/2016   MCV 85 11/19/2016   PLT 193 11/19/2016    No results found for: "TSH"    Cc:  Shon Baton, MD

## 2022-07-23 ENCOUNTER — Ambulatory Visit (INDEPENDENT_AMBULATORY_CARE_PROVIDER_SITE_OTHER): Payer: Medicare Other | Admitting: Neurology

## 2022-07-23 ENCOUNTER — Encounter: Payer: Self-pay | Admitting: Neurology

## 2022-07-23 VITALS — BP 118/72 | HR 87 | Ht 64.0 in | Wt 128.6 lb

## 2022-07-23 DIAGNOSIS — G20A1 Parkinson's disease without dyskinesia, without mention of fluctuations: Secondary | ICD-10-CM | POA: Diagnosis not present

## 2022-07-23 MED ORDER — CARBIDOPA-LEVODOPA ER 50-200 MG PO TBCR: 1.0000 | EXTENDED_RELEASE_TABLET | Freq: Every day | ORAL | 1 refills | Status: DC

## 2022-07-23 NOTE — Patient Instructions (Addendum)
Continue carbidopa/levodopa 25/100 at 7am/11am/4pm ADD carbidopa/levodopa 50/200 CR at bedtime for the cramping  You are looking great!!  Local and Online Resources for Power over Parkinson's Group  March 2024   LOCAL [place] PARKINSON'S GROUPS   Power over Parkinson's Group:    Power Over Parkinson's Patient Education Group will be Wednesday, March 13th-*Hybrid meting*- in person at [facility] location and via [facility], 2:00-3:00 pm.   Starting in November 2023, Power over [employer] and Building services engineer Groups will meet together, with plans for separate break out session for caregivers (*this will be evolving over the next few months) Upcoming Power over Parkinson's Meetings/Care Partner Support:  2nd Wednesdays of the month at 2 pm:  March 13th, April 10th [place] at [email or web address] if interested in participating in this group    LOCAL EVENTS AND NEW OFFERINGS  Let's Try Pickleball-$25 for 6 weeks of Pickleball, starting February 2nd.  Contact Corwin Levins for more details.  [email or web address] NEW:  Parkinson's Social Game Night.  First Thursday of each month, 2:00-4:00 pm.  *Next date is MARCH 7th*.  [place], [employer].  Contact [email or web address] if interested. Parkinson's CarePartner Group for Men is in the works, if interested email [email or web address] ACT FITNESS Chair Yoga classes "Train and Gain", Fridays 10 am, ACT Fitness.  Contact Gina at [phone].  PWR! Moves [employer] Instructor-Led Classes offering at [employer]!  TUESDAYS and Wednesdays 1-2 pm.   Contact Vonna Kotyk at  [email or web address]  or [phone] (Tuesday classes are modified for chair and standing only) Drumming for Parkinson's will be held on 2nd and 4th Mondays at 11:00 am.   Located at the [place] ([place].)  Contact Doylene Canning at  [email or web address] or [phone]  Dance for Parkinson 's classes will be on Tuesdays 10-11 am starting in February. Located in the [employer] , in the first floor of the [employer] ([place] To register:  [email or web address] or [phone] [facility] [place] Class, Mondays at 11 am.  Call [phone] for details [place].  Saturday, May 4th, 10 am start.  Register at [employer].[place]:  [email or web address]  PD Health at Home continues:  Mindfulness Mondays, Wellness Wednesdays, Fitness Fridays  (PWR! Moves as part of Fitness Fridays March 22nd, 1-1:45 pm) Upcoming Education:   Managing "Off" Periods:  Return of Parkinson's Symptoms.  Wednesday, March 20th, 1-2 pm Parkinson's 101.  Wednesday, April 3rd, 1-2 pm Expert Briefing:  Understanding Pain in Parkinson's.   Wednesday, March 13th, 1-2 pm  Research Update:  Working to [employer] PD.  Wednesday, April 10th, 1-2 pm Register for virtual education and Patent attorney (webinars) at DebtSupply.pl Please check out their website to sign up for emails and see their full online offerings     [place]:  [email or web address]   Third Thursday Webinars:  On the third Thursday of every month at 12 p.m. ET, join our free live webinars to learn about various aspects of living with Parkinson's disease and our work to speed medical breakthroughs.  Upcoming Webinar:  Everyday Exposure to Parkinson's:  Environmental Connections to the Disease.  Thursday, March 21st at 12 noon. Check out additional information on their website to see their full online offerings    [facility]:  [email or web address]  Upcoming Webinar:   Nutrition and Parkinson's.  Wednesday, March 6th, 12  noon Webinar Series:  Living with Parkinson's Meetup.   Third Thursdays  each month, 3 pm  Care Partner Monthly Meetup.  With Robin Searing Phinney.  First Tuesday of each month, 2 pm  Check out additional information to Live Well Today on their website    Parkinson and Movement Disorders (PMD) Alliance:  www.pmdalliance.org  NeuroLife Online:  Online Education Events  Sign up for emails, which are sent weekly to give you updates on programming and online offerings    Parkinson's Association of the [place]:  www.[email or web address]  Information on online support groups, education events, and online exercises including Yoga, Parkinson's exercises and more-LOTS of information on links to PD resources and online events  Virtual Support Group through Parkinson's Association of the [place]; next one is scheduled for Wednesday, March 6th  MOVEMENT AND EXERCISE OPPORTUNITIES  PWR! Moves Classes at [place].  Wednesdays 10 and 11 am.   Contact Amy Marriott, PT [email or web address] if interested.  PWR! Moves Class offerings at [employer]. *TUESDAYS* and Wednesdays 1-2 pm.    Contact Vonna Kotyk at  [email or web address]    Parkinson's Wellness Recovery (PWR! Moves)  www.[email or web address]  Info on the PWR! Virtual Experience:  You will have access to our expertise?through self-assessment, guided plans that start with the PD-specific fundamentals, educational content, tips, Q&A with an expert, and a growing Art therapist of PD-specific pre-recorded and live exercise classes of varying types and intensity - both physical and cognitive! If that is not enough, we offer 1:1 wellness consultations (in-person or virtual) to personalize your PWR! Research scientist (medical).   [place] Fridays:   As part of the PD Health @ Home program, this free video series focuses each week on one aspect of fitness designed to support people living with Parkinson's.? These weekly videos highlight the [place] fitness guidelines for  people with Parkinson's disease.  ModemGamers.si  Dance for PD website is offering free, live-stream classes throughout the week, as well as links to [employer] of classes:  [email or web address]  Virtual dance and Pilates for Parkinson's classes: Click on the Community Tab> Parkinson's Movement Initiative Tab.  To register for classes and for more information, visit [email or web address] and click the "community" tab.   [employer] Parkinson's Cycling Classes   [employer]:  Thursdays @ Noon-Live classes at [employer] ([employer] at [email or web address]?or ([phone])  [employer]: Virtual Classes Mondays and Thursdays Jeanette Caprice classes Tuesday, Wednesday and Thursday (contact [place] at [email or web address] ?or ([phone])  [place]  Varied levels of classes are offered Tuesdays and Thursdays at [employer].   Stretching with Verdis Frederickson weekly class is also offered for people with Parkinson's  To observe a class or for more information, call [phone] or email Hezzie Bump at [email or web address]   ADDITIONAL SUPPORT AND RESOURCES  Well-Spring [employer]:Online Caregiver Education Opportunities:  www.well-springsolutions.org/caregiver-education/caregiver-support-group.  You may also contact Vickki Muff at [email or web address] or [phone].     Family Caregiver [phone].  Thursday, March 7th, 10:15-1:45 at [facility].  Register with [facility] (see above) Well-Spring Navigator:  Just1Navigator program, a?free service to help individuals and families through the journey of determining care for older adults.  The "Navigator" is a Vickki Muff, Education officer, museum, who will speak with a prospective client and/or loved ones to provide an assessment of the situation and a set of recommendations for a personalized care plan -- all free of charge, and  whether?Well-Spring Solutions offers the needed service or not. If the need is not a  service we provide, we are well-connected with reputable programs in town that we can refer you to.  www.well-springsolutions.org or to speak with the Navigator, call [phone].

## 2022-07-30 ENCOUNTER — Telehealth: Payer: Self-pay | Admitting: Internal Medicine

## 2022-07-30 ENCOUNTER — Ambulatory Visit: Payer: Medicare Other | Admitting: Internal Medicine

## 2022-07-30 NOTE — Telephone Encounter (Signed)
Spoke with pt and advised follow up with Dr Caryl Comes is scheduled for August 2024.  Pt will receive a reminder letter or she may choose to call in May or June for appointment.  Pt verbalizes understanding and thanked Therapist, sports for the call.

## 2022-07-30 NOTE — Telephone Encounter (Signed)
Pt came in for her cancelled appt. Pt advises she was not notified. Did not want to reschedule until she heard from someone on what to do. Please call asap with instructions on follow up with dr Caryl Comes.  [phone]

## 2022-09-12 ENCOUNTER — Encounter (INDEPENDENT_AMBULATORY_CARE_PROVIDER_SITE_OTHER): Payer: Medicare Other | Admitting: Ophthalmology

## 2022-09-12 DIAGNOSIS — I10 Essential (primary) hypertension: Secondary | ICD-10-CM | POA: Diagnosis not present

## 2022-09-12 DIAGNOSIS — H35033 Hypertensive retinopathy, bilateral: Secondary | ICD-10-CM

## 2022-09-12 DIAGNOSIS — H43813 Vitreous degeneration, bilateral: Secondary | ICD-10-CM

## 2022-09-12 DIAGNOSIS — H348112 Central retinal vein occlusion, right eye, stable: Secondary | ICD-10-CM | POA: Diagnosis not present

## 2022-09-12 DIAGNOSIS — D3132 Benign neoplasm of left choroid: Secondary | ICD-10-CM

## 2022-09-17 DIAGNOSIS — H401212 Low-tension glaucoma, right eye, moderate stage: Secondary | ICD-10-CM | POA: Diagnosis not present

## 2022-09-17 DIAGNOSIS — H401221 Low-tension glaucoma, left eye, mild stage: Secondary | ICD-10-CM | POA: Diagnosis not present

## 2022-09-17 DIAGNOSIS — H04123 Dry eye syndrome of bilateral lacrimal glands: Secondary | ICD-10-CM | POA: Diagnosis not present

## 2022-09-25 ENCOUNTER — Other Ambulatory Visit: Payer: Self-pay | Admitting: Neurology

## 2022-09-25 DIAGNOSIS — G20A1 Parkinson's disease without dyskinesia, without mention of fluctuations: Secondary | ICD-10-CM

## 2022-09-27 ENCOUNTER — Ambulatory Visit: Payer: Medicare Other | Admitting: Rehabilitative and Restorative Service Providers"

## 2022-09-27 ENCOUNTER — Ambulatory Visit: Payer: Medicare Other

## 2022-09-27 ENCOUNTER — Other Ambulatory Visit: Payer: Self-pay | Admitting: Neurology

## 2022-09-27 ENCOUNTER — Ambulatory Visit: Payer: Medicare Other | Attending: Neurology | Admitting: Occupational Therapy

## 2022-09-27 DIAGNOSIS — R471 Dysarthria and anarthria: Secondary | ICD-10-CM | POA: Insufficient documentation

## 2022-09-27 DIAGNOSIS — R29818 Other symptoms and signs involving the nervous system: Secondary | ICD-10-CM

## 2022-09-27 DIAGNOSIS — G20A1 Parkinson's disease without dyskinesia, without mention of fluctuations: Secondary | ICD-10-CM

## 2022-09-27 NOTE — Therapy (Signed)
[place] [facility] [address], [address] [place], [place], [address] Phone: [phone]   Fax:  [phone]  Patient Details  Name: [name] MRN: [id number] Date of Birth: Feb 26, 1936 Referring Provider:  Kerin Salen, DO  Encounter Date: 09/27/2022  Speech Therapy Parkinson's Disease Screen   Decibel Level today: upper 60s dB  (WNL=70-72 dB) with sound level meter 30cm away from pt's mouth. Pt's voice quality has become worse since last treatment course and pt would like to reinitiate a course of ST to work on this. SLP agrees with this desire.  Pt does not report difficulty with swallowing, which does not warrant further evaluation  Pt would benefit from speech-language eval; Please order via EPIC, if agreed.   Braydan Marriott, CCC-SLP 09/27/2022, 1:32 PM  [place] [facility] [address] [place], [place], [address] Phone: [phone]   Fax:  [phone]

## 2022-09-27 NOTE — Therapy (Signed)
[place] [facility] [address], [address] [place], [place], [address] Phone: [phone]   Fax:  [phone]  Patient Details  Name: [name] MRN: [id number] Date of Birth: [birth year] Referring Provider:  Creola Corn, MD  Encounter Date: 09/27/2022  Occupational Therapy Parkinson's Disease Screen  Hand dominance:  right   Fastening/unfastening 3 buttons in:  17.78 sec  Box & Blocks Test:   RUE  58 blocks        LUE  65 blocks  Other Comments:  Pt reports noticing increased tremors at night time, even waking her up.  Pt reports increased difficulty with picking up and pouring out heavy pots when cooking.  Pt reports feeling okay with her coordination and self-care tasks at this time, wanting to focus on her voice primarily.  Pt does not require occupational therapy services at this time.  Recommended occupational therapy screen in 6-8 months.   Rosalio Loud, OT 09/27/2022, 11:52 AM  [place] [facility] [address] [place], [place], [address] Phone: [phone]   Fax:  [phone]

## 2022-09-27 NOTE — Therapy (Signed)
[place] [facility] [address] [place], [place], [address] Phone: [phone]   Fax:  [phone]  Patient Details  Name: [name] MRN: [id number] Date of Birth: 1936/03/03 Referring Provider:  Creola Corn, MD  Encounter Date: 09/27/2022  PHYSICAL THERAPY PD SCREENING:  Subjective:  The patient has a swollen ankle and some ankle pain. She is having some issues with her knees and plans to get injections soon. She is using the cane to improve comfort. S he has not had any falls.   Timed Up and Go: 10.69 seconds (slowed from 8.67 seconds on 01/30/22) 5 time sit to stand: 10.94 seconds (improved from last known PT score) Gait speed: 10.06 seconds (3.26 ft/sec)  Recommendations: Due to patient reports of L ankle pain, bilat knee pain, and PT noting increased R foot IR with gait, PT recommends short course of PT.  Her TUG has also slowed, although not showing a high fall risk by cut off scores. Patient notes she feels she would benefit from PT.   Shaliah Wann, PT 09/27/2022, 12:37 PM [facility] [address] [place], [place], [address] Phone: [phone]   Fax:  [phone]

## 2022-10-01 ENCOUNTER — Other Ambulatory Visit: Payer: Self-pay

## 2022-10-01 DIAGNOSIS — G20A1 Parkinson's disease without dyskinesia, without mention of fluctuations: Secondary | ICD-10-CM

## 2022-10-01 DIAGNOSIS — Z9181 History of falling: Secondary | ICD-10-CM

## 2022-10-01 DIAGNOSIS — R269 Unspecified abnormalities of gait and mobility: Secondary | ICD-10-CM

## 2022-10-01 DIAGNOSIS — R1310 Dysphagia, unspecified: Secondary | ICD-10-CM

## 2022-10-01 DIAGNOSIS — R479 Unspecified speech disturbances: Secondary | ICD-10-CM

## 2022-10-02 DIAGNOSIS — I2510 Atherosclerotic heart disease of native coronary artery without angina pectoris: Secondary | ICD-10-CM | POA: Diagnosis not present

## 2022-10-02 DIAGNOSIS — Z7901 Long term (current) use of anticoagulants: Secondary | ICD-10-CM | POA: Diagnosis not present

## 2022-10-02 DIAGNOSIS — I471 Supraventricular tachycardia, unspecified: Secondary | ICD-10-CM | POA: Diagnosis not present

## 2022-10-02 DIAGNOSIS — R251 Tremor, unspecified: Secondary | ICD-10-CM | POA: Diagnosis not present

## 2022-10-02 DIAGNOSIS — I700 Atherosclerosis of aorta: Secondary | ICD-10-CM | POA: Diagnosis not present

## 2022-10-02 DIAGNOSIS — G20A1 Parkinson's disease without dyskinesia, without mention of fluctuations: Secondary | ICD-10-CM | POA: Diagnosis not present

## 2022-10-02 DIAGNOSIS — I5189 Other ill-defined heart diseases: Secondary | ICD-10-CM | POA: Diagnosis not present

## 2022-10-02 DIAGNOSIS — I2584 Coronary atherosclerosis due to calcified coronary lesion: Secondary | ICD-10-CM | POA: Diagnosis not present

## 2022-10-02 DIAGNOSIS — D6869 Other thrombophilia: Secondary | ICD-10-CM | POA: Diagnosis not present

## 2022-10-02 DIAGNOSIS — I341 Nonrheumatic mitral (valve) prolapse: Secondary | ICD-10-CM | POA: Diagnosis not present

## 2022-10-02 DIAGNOSIS — I5042 Chronic combined systolic (congestive) and diastolic (congestive) heart failure: Secondary | ICD-10-CM | POA: Diagnosis not present

## 2022-10-02 DIAGNOSIS — I480 Paroxysmal atrial fibrillation: Secondary | ICD-10-CM | POA: Diagnosis not present

## 2022-10-08 NOTE — Therapy (Signed)
OUTPATIENT PHYSICAL THERAPY NEURO EVALUATION   Patient Name: [name] MRN: [id number] DOB:1935/10/13, 87 y.o., female Today's Date: 10/09/2022   PCP:    Creola Corn, MD   REFERRING PROVIDER: Tat, Octaviano Batty, DO  END OF SESSION:  PT End of Session - 10/09/22 1241     Visit Number 1    Number of Visits 13    Date for PT Re-Evaluation 11/20/22    Authorization Type Medicare/BCBS    PT Start Time 1148    PT Stop Time 1228    PT Time Calculation (min) 40 min    Equipment Utilized During Treatment Gait belt    Activity Tolerance Patient tolerated treatment well    Behavior During Therapy WFL for tasks assessed/performed             Past Medical History:  Diagnosis Date   Allergic rhinitis    Arthritis    Atrial fibrillation (HCC)    BCC (basal cell carcinoma)    Benign tumor of pleura 2007   Bursitis    Bursitis of shoulder    Cataracts, bilateral    Complication of anesthesia    Cough since 05-09-2014   with sore throat   Diastolic dysfunction    Fracture    at l1   Fracture    right arm x 2   Glaucoma (increased eye pressure)    Hearing loss    History of blood transfusion age 55 or 8   Hyperlipidemia    IBS (irritable bowel syndrome)    Insomnia    Lung tumor (benign)    OA (osteoarthritis)    Osteopenia    Paroxysmal atrial fibrillation (HCC)    PONV (postoperative nausea and vomiting) 48 yrs ago   with ether   Sarcoidosis    As a child   Seasonal allergies    Tortuous aortic arch    Past Surgical History:  Procedure Laterality Date   ABDOMINAL HYSTERECTOMY     complete   APPENDECTOMY     FRACTURE SURGERY Right 2007   Left video-assisted thoracoscopy, left thoracotomy with wedge resection of left lower lobe lung mass.  Insertion of On-Q pain pump  12/03/2005   [place]   ROTATOR CUFF REPAIR Left 2008   TONSILLECTOMY  as child   TOTAL HIP ARTHROPLASTY Right 05/18/2014   Procedure: RIGHT TOTAL HIP ARTHROPLASTY ANTERIOR APPROACH;  Surgeon: Shelda Pal, MD;  Location: [facility];  Service: Orthopedics;  Laterality: Right;   VAGINAL HYSTERECTOMY     WRIST SURGERY     Patient Active Problem List   Diagnosis Date Noted   (HFpEF) heart failure with preserved ejection fraction (HCC) 10/30/2021   LBBB (left bundle branch block) 10/30/2021   IBS (irritable bowel syndrome) 03/16/2019   Heme + stool 03/16/2019   MVP (mitral valve prolapse) 11/19/2016   Sinus bradycardia 11/19/2016   S/P right THA, AA 05/18/2014   Myalgia 04/02/2012   Atrial fibrillation (HCC) 07/23/2011   Lightheadedness 07/23/2011   Dyspnea on exertion 07/23/2011   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    Hyperlipidemia    Fracture     ONSET DATE: 09/27/22  REFERRING DIAG: G20.A1 (ICD-10-CM) - Parkinson's disease without dyskinesia or fluctuating manifestations  THERAPY DIAG:  Pain in left ankle and joints of left foot  Chronic pain of left knee  Chronic pain of right knee  Unsteadiness on feet  Other abnormalities of gait and mobility  Muscle weakness (generalized)  Rationale for Evaluation and  Treatment: Rehabilitation  SUBJECTIVE:                                                                                                                                                                                             SUBJECTIVE STATEMENT: Patient reports "2 bum knees"- has been getting L knee cortisone injections and feels that she sprained her R knee after a fall over a year ago. Reports trouble getting up and down as a result. Reports fatigue with prolonged standing. The L ankle has been getting swollen for quite some time but in the last few weeks she has been having pain putting weight on it. Set up to see MD about this. This is limiting standing and walking, that is why she started using the cane. Has a caregiver for husband 10-4 but husband does not sleep well, resulting in her not resting well either.   Pt accompanied by: self  PERTINENT HISTORY:  A-fib, cataracts, R arm fx x2, HLD, L thoracotomy with wedge resection, L RTC repair, R THA, wrist surgery   PAIN:  Are you having pain? Yes: NPRS scale: 1/10 Pain location: B knees, L ankle Pain description: pt unable to describe Aggravating factors: standing, WBing Relieving factors: knee sleeve, ankle sleeve, tylenol  PRECAUTIONS: Fall  WEIGHT BEARING RESTRICTIONS: No  FALLS: Has patient fallen in last 6 months? No  LIVING ENVIRONMENT: Lives with: lives with their spouse; caregiver for husband with dementia but has a progression caregiver as well 10am-4pm Lives in: House/apartment Stairs:  chairlift on staircase with handrail; 6-8 steps, then 3 steps with handrail Has following equipment at home: Single point cane, Shower bench, and Grab bars  PLOF: Independent  PATIENT GOALS: improve pain,   OBJECTIVE:   DIAGNOSTIC FINDINGS:  01/07/22 brain MRI: Unremarkable appearance of the brain for age.  COGNITION: Overall cognitive status: Within functional limits for tasks assessed   SENSATION: Pt reports occasional N/T in B fingers  COORDINATION: Alternating pronation/supination: intact B Alternating toe tap: intact B Finger to nose: intact B Heel to shin: some limited ROM likely d/t tight hips   MUSCLE TONE: some increased tone in the L ankle with DF  Palpation: TTP sightly over L achilles; no redness, warmth, discoloration over calf. Moderate L ankle swelling  POSTURE: rounded shoulders, forward head, and increased thoracic kyphosis  LOWER EXTREMITY ROM:     Active  Right Eval Left Eval  Hip flexion    Hip extension    Hip abduction    Hip adduction    Hip internal rotation    Hip external rotation    Knee flexion  Knee extension    Ankle dorsiflexion 15 17  Ankle plantarflexion WNL WNL  Ankle inversion WNL WNL  Ankle eversion WNL WNL   (Blank rows = not tested)  LOWER EXTREMITY MMT:    MMT (in sitting) Right Eval Left Eval  Hip flexion 4+ 4  Hip  extension    Hip abduction 4 4+  Hip adduction 4+ 4+  Hip internal rotation 4 Gave way to pain in L knee  Hip external rotation 4 *ROM more limited on this side 4+  Knee flexion 4 4  Knee extension 4 4+  Ankle dorsiflexion 4+ 4+  Ankle plantarflexion 4+ 4+  Ankle inversion 4+ 4+  Ankle eversion 4- 4- *some hip discomfort   (Blank rows = not tested)  GAIT: Gait pattern: Limited foot clearance B and feet shuffling on floor, B toe-in but worse on R Assistive device utilized: None Level of assistance: Modified independence   FUNCTIONAL TESTS:   [facility] PT Assessment - 10/09/22 0001       Standardized Balance Assessment   Standardized Balance Assessment Timed Up and Go Test      Mini-BESTest   Sit To Stand Normal: Comes to stand without use of hands and stabilizes independently.    Rise to Toes < 3 s.    Stand on one leg (left) Moderate: < 20 s   3 sec   Stand on one leg (right) Moderate: < 20 s   9 sec   Stand on one leg - lowest score 1    Stance - Feet together, eyes open, firm surface  Normal: 30s    Stance - Feet together, eyes closed, foam surface  Moderate: < 30s   unable to maintain romberg positioning and balance     Timed Up and Go Test   Normal TUG (seconds) 14.84    Manual TUG (seconds) 15.97   posterior LOB upon standing up, requiring min A   Cognitive TUG (seconds) 18.17   stopped counting   TUG Comments 7.6% increase in manual TUG, 22.4% increase in TUG cog               TODAY'S TREATMENT:                                                                                                                              DATE: 10/10/22    PATIENT EDUCATION: Education details: prognosis, POC, HEP; listened to pt's concerns about limited her/PT clinic's schedule availability  Person educated: Patient Education method: Explanation, Demonstration, Tactile cues, Verbal cues, and Handouts Education comprehension: verbalized understanding and returned  demonstration  HOME EXERCISE PROGRAM: Access Code: [email or web address] URL: [email or web address] Date: 10/09/2022 Prepared by: [facility] - Outpatient  Rehab - [facility]  Exercises - Seated Hip Abduction  - 1 x daily - 5 x weekly - 2 sets - 10 reps - Standing Hip Abduction with Counter Support  - 1 x daily - 5 x weekly -  2 sets - 10 reps - Mini Squat with Counter Support  - 1 x daily - 5 x weekly - 2 sets - 10 reps - Sit to Stand Without Arm Support  - 1 x daily - 5 x weekly - 2 sets - 10 reps  GOALS: Goals reviewed with patient? Yes  SHORT TERM GOALS: Target date: 10/30/2022  Patient to be independent with initial HEP. Baseline: HEP initiated Goal status: INITIAL    LONG TERM GOALS: Target date: 11/20/2022  Patient to be independent with advanced HEP. Baseline: Not yet initiated  Goal status: INITIAL  Patient to improve MiniBestTest score to atleast 17-21 to decrease risk of falls.  Baseline: not completed today Goal status: INITIAL  Patient to demonstrate <10% increase between TUG and TUG cognitive to improve ability to dual task in home/community.  Baseline: 22% on TUG cog Goal status: INITIAL  Patient to demonstrate at least 4+/5 B LE strength to improve gait efficiency.  Baseline: see above Goal status: INITIAL  Patient to report tolerate for standing/walking for 30 minutes without pain/fatigue limiting.  Baseline: unable Goal status: INITIAL  Patient to verbalize understanding of fall prevention in home environment information. Baseline: Not yet initiated Goal status: INITIAL    ASSESSMENT:  CLINICAL IMPRESSION:  Patient is an 87 y/o F, PMH significant for Parkinson's,  presenting to OPPT with c/o chronic B knee and acute L ankle pain. Speaking with patient, she reports a decline in her overall function and mobility since stopping PT in October 2023. Patient today presenting with increased L ankle tone, TTP over L Achilles, L ankle edema, rounded  posture, limited B LE strength, gait deviations, and imbalance. Patient was educated on gentle strengthening HEP and reported understanding. Would benefit from skilled PT services 1-2 x/week for 6 weeks to address aforementioned impairments in order to optimize level of function.    OBJECTIVE IMPAIRMENTS: Abnormal gait, decreased activity tolerance, decreased balance, difficulty walking, decreased ROM, decreased strength, increased muscle spasms, impaired flexibility, impaired tone, postural dysfunction, and pain.   ACTIVITY LIMITATIONS: carrying, lifting, bending, sitting, standing, squatting, stairs, transfers, bed mobility, bathing, toileting, dressing, reach over head, hygiene/grooming, locomotion level, and caring for others  PARTICIPATION LIMITATIONS: meal prep, cleaning, laundry, driving, shopping, community activity, and church  PERSONAL FACTORS: Age, Fitness, Past/current experiences, Time since onset of injury/illness/exacerbation, and 3+ comorbidities: A-fib, cataracts, R arm fx x2, HLD, L thoracotomy with wedge resection, L RTC repair, R THA, wrist surgery   are also affecting patient's functional outcome.   REHAB POTENTIAL: Good  CLINICAL DECISION MAKING: Evolving/moderate complexity  EVALUATION COMPLEXITY: Moderate  PLAN:  PT FREQUENCY: 1-2x/week  PT DURATION: 6 weeks  PLANNED INTERVENTIONS: Therapeutic exercises, Therapeutic activity, Neuromuscular re-education, Balance training, Gait training, Patient/Family education, Self Care, Joint mobilization, Stair training, Vestibular training, Canalith repositioning, DME instructions, Aquatic Therapy, Dry Needling, Electrical stimulation, Cryotherapy, Moist heat, Taping, Manual therapy, and Re-evaluation  PLAN FOR NEXT SESSION: complete MiniBest, review HEP and progress LE strengthening, balance   Anette Guarneri, PT, DPT 10/09/22 12:58 PM   Outpatient Rehab at [facility] [address] [place], [place] [address] Phone # [phone] Fax # [phone]

## 2022-10-09 ENCOUNTER — Ambulatory Visit: Payer: Medicare Other | Attending: Neurology | Admitting: Physical Therapy

## 2022-10-09 ENCOUNTER — Ambulatory Visit: Payer: Medicare Other

## 2022-10-09 ENCOUNTER — Other Ambulatory Visit: Payer: Self-pay

## 2022-10-09 DIAGNOSIS — G8929 Other chronic pain: Secondary | ICD-10-CM | POA: Diagnosis not present

## 2022-10-09 DIAGNOSIS — G20A1 Parkinson's disease without dyskinesia, without mention of fluctuations: Secondary | ICD-10-CM | POA: Insufficient documentation

## 2022-10-09 DIAGNOSIS — R2681 Unsteadiness on feet: Secondary | ICD-10-CM | POA: Insufficient documentation

## 2022-10-09 DIAGNOSIS — M25562 Pain in left knee: Secondary | ICD-10-CM | POA: Insufficient documentation

## 2022-10-09 DIAGNOSIS — M25561 Pain in right knee: Secondary | ICD-10-CM | POA: Diagnosis not present

## 2022-10-09 DIAGNOSIS — M25572 Pain in left ankle and joints of left foot: Secondary | ICD-10-CM | POA: Insufficient documentation

## 2022-10-09 DIAGNOSIS — M6281 Muscle weakness (generalized): Secondary | ICD-10-CM | POA: Diagnosis not present

## 2022-10-09 DIAGNOSIS — R2689 Other abnormalities of gait and mobility: Secondary | ICD-10-CM | POA: Insufficient documentation

## 2022-10-10 ENCOUNTER — Ambulatory Visit: Payer: Medicare Other

## 2022-10-11 ENCOUNTER — Other Ambulatory Visit (HOSPITAL_COMMUNITY): Payer: Self-pay | Admitting: *Deleted

## 2022-10-12 ENCOUNTER — Ambulatory Visit (INDEPENDENT_AMBULATORY_CARE_PROVIDER_SITE_OTHER): Payer: Medicare Other

## 2022-10-12 ENCOUNTER — Ambulatory Visit (INDEPENDENT_AMBULATORY_CARE_PROVIDER_SITE_OTHER): Payer: Medicare Other | Admitting: Podiatry

## 2022-10-12 ENCOUNTER — Encounter: Payer: Self-pay | Admitting: Podiatry

## 2022-10-12 DIAGNOSIS — M7752 Other enthesopathy of left foot: Secondary | ICD-10-CM

## 2022-10-12 NOTE — Progress Notes (Unsigned)
Subjective:   Patient ID: [name], female   DOB: 87 y.o.   MRN: [id number]   HPI Chief Complaint  Patient presents with   Ankle Pain    Ankle left - swelling, aching x several months, Dr Alberteen Spindle has been treating-thought just had some fluid, Rx'd fluid pills, noticed an increase in pain and weakness, she has parkinsons - in PT for balance as well   New Patient (Initial Visit)    She was wearing a "sleeve" as well but it wa rubbbing. 2 weeks she was not able to put wiehg ton the foot and could not straighed her foot out.    ROS      Objective:  Physical Exam  ***     Assessment:  ***     Plan:  ***

## 2022-10-15 ENCOUNTER — Ambulatory Visit: Payer: Medicare Other | Admitting: Physical Therapy

## 2022-10-15 ENCOUNTER — Ambulatory Visit (HOSPITAL_COMMUNITY)
Admission: RE | Admit: 2022-10-15 | Discharge: 2022-10-15 | Disposition: A | Discharge status: Home / Self Care | Payer: Medicare Other | Source: Ambulatory Visit | Attending: Internal Medicine | Admitting: Internal Medicine

## 2022-10-15 DIAGNOSIS — M810 Age-related osteoporosis without current pathological fracture: Secondary | ICD-10-CM | POA: Insufficient documentation

## 2022-10-15 MED ORDER — DENOSUMAB 60 MG/ML ~~LOC~~ SOSY
60.0000 mg | PREFILLED_SYRINGE | Freq: Once | SUBCUTANEOUS | Status: AC
  Administered 2022-10-15: 60 mg via SUBCUTANEOUS

## 2022-10-15 MED ORDER — DENOSUMAB 60 MG/ML ~~LOC~~ SOSY
PREFILLED_SYRINGE | SUBCUTANEOUS | Status: AC
  Filled 2022-10-15: qty 1

## 2022-10-17 ENCOUNTER — Ambulatory Visit: Payer: Medicare Other

## 2022-10-17 ENCOUNTER — Ambulatory Visit: Payer: Medicare Other | Admitting: Physical Therapy

## 2022-10-17 DIAGNOSIS — M25561 Pain in right knee: Secondary | ICD-10-CM | POA: Diagnosis not present

## 2022-10-17 DIAGNOSIS — M170 Bilateral primary osteoarthritis of knee: Secondary | ICD-10-CM | POA: Diagnosis not present

## 2022-10-17 DIAGNOSIS — M25562 Pain in left knee: Secondary | ICD-10-CM | POA: Diagnosis not present

## 2022-10-19 DIAGNOSIS — J029 Acute pharyngitis, unspecified: Secondary | ICD-10-CM | POA: Diagnosis not present

## 2022-10-19 DIAGNOSIS — I480 Paroxysmal atrial fibrillation: Secondary | ICD-10-CM | POA: Diagnosis not present

## 2022-10-19 DIAGNOSIS — B349 Viral infection, unspecified: Secondary | ICD-10-CM | POA: Diagnosis not present

## 2022-10-19 DIAGNOSIS — Z1152 Encounter for screening for COVID-19: Secondary | ICD-10-CM | POA: Diagnosis not present

## 2022-10-19 DIAGNOSIS — Z7901 Long term (current) use of anticoagulants: Secondary | ICD-10-CM | POA: Diagnosis not present

## 2022-10-19 DIAGNOSIS — I341 Nonrheumatic mitral (valve) prolapse: Secondary | ICD-10-CM | POA: Diagnosis not present

## 2022-10-19 DIAGNOSIS — G20A1 Parkinson's disease without dyskinesia, without mention of fluctuations: Secondary | ICD-10-CM | POA: Diagnosis not present

## 2022-10-19 DIAGNOSIS — I5042 Chronic combined systolic (congestive) and diastolic (congestive) heart failure: Secondary | ICD-10-CM | POA: Diagnosis not present

## 2022-10-22 ENCOUNTER — Ambulatory Visit
Admission: RE | Admit: 2022-10-22 | Discharge: 2022-10-22 | Disposition: A | Discharge status: Home / Self Care | Payer: Medicare Other | Source: Ambulatory Visit | Attending: Podiatry | Admitting: Podiatry

## 2022-10-22 DIAGNOSIS — M7752 Other enthesopathy of left foot: Secondary | ICD-10-CM

## 2022-10-22 DIAGNOSIS — M25572 Pain in left ankle and joints of left foot: Secondary | ICD-10-CM | POA: Diagnosis not present

## 2022-10-23 ENCOUNTER — Ambulatory Visit: Payer: Medicare Other | Admitting: Physical Therapy

## 2022-10-25 ENCOUNTER — Encounter: Payer: Medicare Other | Admitting: Speech Pathology

## 2022-10-29 ENCOUNTER — Ambulatory Visit: Payer: Medicare Other | Admitting: Neurology

## 2022-10-30 ENCOUNTER — Ambulatory Visit: Payer: Medicare Other | Admitting: Physical Therapy

## 2022-11-05 ENCOUNTER — Ambulatory Visit: Payer: Medicare Other | Admitting: Physical Therapy

## 2022-11-07 ENCOUNTER — Encounter: Payer: Medicare Other | Admitting: Speech Pathology

## 2022-11-07 ENCOUNTER — Ambulatory Visit: Payer: Medicare Other | Admitting: Physical Therapy

## 2022-11-13 ENCOUNTER — Ambulatory Visit: Payer: Medicare Other | Admitting: Physical Therapy

## 2022-11-15 ENCOUNTER — Ambulatory Visit: Payer: Medicare Other | Admitting: Physical Therapy

## 2022-12-10 ENCOUNTER — Other Ambulatory Visit: Payer: Self-pay | Admitting: Internal Medicine

## 2022-12-10 DIAGNOSIS — I4819 Other persistent atrial fibrillation: Secondary | ICD-10-CM

## 2022-12-10 NOTE — Telephone Encounter (Signed)
Prescription refill request for Eliquis received. Indication: Afib  Last office visit: 06/19/22 Graciela Husbands)  Scr: 0.8 (10/02/22)  Age: 87 Weight: 58.3kg  Labs received from PCP. Appropriate dose. Refill sent.

## 2022-12-11 ENCOUNTER — Ambulatory Visit: Payer: Medicare Other | Admitting: Physical Therapy

## 2022-12-13 ENCOUNTER — Encounter (INDEPENDENT_AMBULATORY_CARE_PROVIDER_SITE_OTHER): Payer: Medicare Other | Admitting: Ophthalmology

## 2022-12-13 DIAGNOSIS — H348112 Central retinal vein occlusion, right eye, stable: Secondary | ICD-10-CM

## 2022-12-13 DIAGNOSIS — H43813 Vitreous degeneration, bilateral: Secondary | ICD-10-CM | POA: Diagnosis not present

## 2022-12-13 DIAGNOSIS — D3132 Benign neoplasm of left choroid: Secondary | ICD-10-CM

## 2022-12-18 ENCOUNTER — Ambulatory Visit: Payer: Medicare Other | Admitting: Internal Medicine

## 2023-01-14 ENCOUNTER — Other Ambulatory Visit: Payer: Self-pay | Admitting: Neurology

## 2023-01-14 DIAGNOSIS — G20A1 Parkinson's disease without dyskinesia, without mention of fluctuations: Secondary | ICD-10-CM

## 2023-01-16 ENCOUNTER — Ambulatory Visit: Payer: Medicare Other

## 2023-01-21 NOTE — Progress Notes (Unsigned)
    Assessment/Plan:   1.  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  We discussed that it used to be thought that levodopa would increase risk of melanoma but now it is believed that Parkinsons itself likely increases risk of melanoma. she is to get regular skin checks.   2.  Recurrent atrial arrhythmias  -On Inderal as needed by cardiology.  -On apixaban Subjective:   [name] was seen today in follow up for Parkinsons disease.  My previous records were reviewed prior to todays visit as well as outside records available to me.  We added bedtime levodopa last visit, primarily for cramping in the feet/legs.  She reports that ***.  She has had no falls.  She has been following with Dr. Charlann Boxer for knee pain.  Current prescribed movement disorder medications: Carbidopa/levodopa 25/100, 1 tablet 3 times per day (started last visit) Carbidopa/levodopa 50/200 CR at bed (added last visit)  ALLERGIES:   Allergies  Allergen Reactions   Alendronate Swelling   Fosamax [Alendronate Sodium] Swelling    Joint swelling   Poison Ivy Extract     Severe hives, needed steroid taper last time   Trazodone And Nefazodone     Nightmares    CURRENT MEDICATIONS:  No outpatient medications have been marked as taking for the 01/22/23 encounter (Appointment) with Arabelle Bollig, Octaviano Batty, DO.     Objective:   PHYSICAL EXAMINATION:    VITALS:   There were no vitals filed for this visit.   GEN:  The patient appears stated age and is in NAD. HEENT:  Normocephalic, atraumatic.  The mucous membranes are moist. The superficial temporal arteries are without ropiness or tenderness. CV:  RRR Lungs:  CTAB Neck/HEME:  There are no carotid bruits bilaterally.  Neurological examination:  Orientation: The patient is alert and oriented  x3. Cranial nerves: There is good facial symmetry with min facial hypomimia. The speech is fluent and clear. Soft palate rises symmetrically and there is no tongue deviation. Hearing is intact to conversational tone. Sensation: Sensation is intact to light touch throughout Motor: Strength is at least antigravity x4.  Movement examination: Tone: There is nl tone in the UE/LE Abnormal movements: there is mild tremor in the RUE, intermittent Coordination:  There is no decremation with any form of RAMS, including alternating supination and pronation of the forearm, hand opening and closing, finger taps, heel taps and toe taps.  Gait and Station: The patient pushes off to arise.  The patient's stride length is purposeful with marching.  She has decreased arm swing on the R   Total time spent on today's visit was *** minutes, including both face-to-face time and nonface-to-face time.  Time included that spent on review of records (prior notes available to me/labs/imaging if pertinent), discussing treatment and goals, answering patient's questions and coordinating care.   Cc:  Creola Corn, MD

## 2023-01-22 ENCOUNTER — Ambulatory Visit (INDEPENDENT_AMBULATORY_CARE_PROVIDER_SITE_OTHER): Payer: Medicare Other | Admitting: Neurology

## 2023-01-22 ENCOUNTER — Encounter: Payer: Self-pay | Admitting: Neurology

## 2023-01-22 VITALS — BP 118/62 | HR 86 | Ht 65.0 in | Wt 119.4 lb

## 2023-01-22 DIAGNOSIS — Z636 Dependent relative needing care at home: Secondary | ICD-10-CM

## 2023-01-22 DIAGNOSIS — G20A1 Parkinson's disease without dyskinesia, without mention of fluctuations: Secondary | ICD-10-CM

## 2023-02-04 ENCOUNTER — Encounter: Payer: Self-pay | Admitting: Internal Medicine

## 2023-02-04 ENCOUNTER — Ambulatory Visit: Payer: Medicare Other | Attending: Internal Medicine | Admitting: Internal Medicine

## 2023-02-04 VITALS — BP 104/62 | HR 78 | Ht 65.0 in | Wt 118.0 lb

## 2023-02-04 DIAGNOSIS — Z95810 Presence of automatic (implantable) cardiac defibrillator: Secondary | ICD-10-CM | POA: Insufficient documentation

## 2023-02-04 DIAGNOSIS — I4819 Other persistent atrial fibrillation: Secondary | ICD-10-CM | POA: Insufficient documentation

## 2023-02-04 DIAGNOSIS — I493 Ventricular premature depolarization: Secondary | ICD-10-CM | POA: Diagnosis not present

## 2023-02-04 DIAGNOSIS — I5032 Chronic diastolic (congestive) heart failure: Secondary | ICD-10-CM | POA: Insufficient documentation

## 2023-02-04 NOTE — Patient Instructions (Signed)
Medication Instructions:  Your physician recommends that you continue on your current medications as directed. Please refer to the Current Medication list given to you today.  *If you need a refill on your cardiac medications before your next appointment, please call your pharmacy*   Lab Work: None ordered.  If you have labs (blood work) drawn today and your tests are completely normal, you will receive your results only by: MyChart Message (if you have MyChart) OR A paper copy in the mail If you have any lab test that is abnormal or we need to change your treatment, we will call you to review the results.   Testing/Procedures: None ordered.    Follow-Up: At [facility], you and your health needs are our priority.  As part of our continuing mission to provide you with exceptional heart care, we have created designated Provider Care Teams.  These Care Teams include your primary Cardiologist (physician) and Advanced Practice Providers (APPs -  Physician Assistants and Nurse Practitioners) who all work together to provide you with the care you need, when you need it.  We recommend signing up for the patient portal called "MyChart".  Sign up information is provided on this After Visit Summary.  MyChart is used to connect with patients for Virtual Visits (Telemedicine).  Patients are able to view lab/test results, encounter notes, upcoming appointments, etc.  Non-urgent messages can be sent to your provider as well.   To learn more about what you can do with MyChart, go to [email or web address].    Your next appointment:   6 months with Dr Klein 

## 2023-02-04 NOTE — Progress Notes (Signed)
Patient Care Team: Creola Corn, MD as PCP - General (Internal Medicine) Duke Salvia, MD as PCP - Cardiology (Cardiology) Alleen Borne, MD (Cardiothoracic Surgery) Creola Corn, MD as Consulting Physician (Internal Medicine) Tat, Octaviano Batty, DO as Consulting Physician (Neurology)  HPI  [name] is a 87 y.o. female Seen in followup for chief complaint of palpitations  associated w dizziness and fatigue History of chest pain but none recently.  Stress echo demonstrated no echocardiographic changes;   Event recorder>>atrial fibrillation. anticoagulation with apixoban,    Was to undergo cardioversion.  Appeared 12/22 in sinus rhythm.  Multiple efforts of been undertaken to try to discern whether symptoms are associated with her paroxysms of atrial fibrillation, whether they are associated with variable rates in atrial fibrillation or unrelated to her A-fib at all.  There is no significant improvement with cardioversion, there been no significant awareness of atrial fibrillation at her last visit 1/23.    The patient denies chest pain, shortness of breath, nocturnal dyspnea, orthopnea or peripheral edema.  There have been no palpitations, lightheadedness or syncope.  Complains of fatigue related to care for her husband.    Wt Readings from Last 3 Encounters:  02/04/23 118 lb (53.5 kg)  01/22/23 119 lb 6.4 oz (54.2 kg)  07/23/22 128 lb 9.6 oz (58.3 kg)       DATE TEST EF   4/13 Echo   55-65 %   4/15 Echo  55-65%   6/21 Echo 40-45%   2/22 Echo 45-50%     Date Cr K Hgb  1/16 0.67  11.2  7/18 0.92  15.0  10/18   15.4   10/19 0.9 4.9 15.1  11/20 0.8 5.3 14.9  5/22 (CE) 0.8 5.0   11/23  4.2 14.9  6/24   13.9     Past Medical History:  Diagnosis Date   Allergic rhinitis    Arthritis    Atrial fibrillation (HCC)    BCC (basal cell carcinoma)    Benign tumor of pleura 2007   Bursitis    Bursitis of shoulder    Cataracts, bilateral    Complication of  anesthesia    Cough since 05-09-2014   with sore throat   Diastolic dysfunction    Fracture    at l1   Fracture    right arm x 2   Glaucoma (increased eye pressure)    Hearing loss    History of blood transfusion age 78 or 8   Hyperlipidemia    IBS (irritable bowel syndrome)    Insomnia    Lung tumor (benign)    OA (osteoarthritis)    Osteopenia    Paroxysmal atrial fibrillation (HCC)    PONV (postoperative nausea and vomiting) 48 yrs ago   with ether   Sarcoidosis    As a child   Seasonal allergies    Tortuous aortic arch     Past Surgical History:  Procedure Laterality Date   ABDOMINAL HYSTERECTOMY     complete   APPENDECTOMY     FRACTURE SURGERY Right 2007   Left video-assisted thoracoscopy, left thoracotomy with wedge resection of left lower lobe lung mass.  Insertion of On-Q pain pump  12/03/2005   [facility]   ROTATOR CUFF REPAIR Left 2008   TONSILLECTOMY  as child   TOTAL HIP ARTHROPLASTY Right 05/18/2014   Procedure: RIGHT TOTAL HIP ARTHROPLASTY ANTERIOR APPROACH;  Surgeon: Shelda Pal, MD;  Location: [facility];  Service:  Orthopedics;  Laterality: Right;   VAGINAL HYSTERECTOMY     WRIST SURGERY      Current Outpatient Medications  Medication Sig Dispense Refill   acetaminophen (TYLENOL) 500 MG tablet Take 500-1,000 mg by mouth every 6 (six) hours as needed for moderate pain.     Calcium Carb-Cholecalciferol (CALTRATE 600+D3 PO) Take 1 tablet by mouth daily.     carbidopa-levodopa (SINEMET CR) 50-200 MG tablet Take 1 tablet by mouth at bedtime. 90 tablet 1   carbidopa-levodopa (SINEMET IR) 25-100 MG tablet TAKE ONE TABLET THREE TIMES DAILY AT 7AM 11AM 4PM 270 tablet 0   cholecalciferol (VITAMIN D3) 25 MCG (1000 UNIT) tablet Take 1,000 Units by mouth daily.     colchicine 0.6 MG tablet Take 0.6 mg by mouth daily as needed.     denosumab (PROLIA) 60 MG/ML SOSY injection Inject 60 mg into the skin every 6 (six) months.     ELIQUIS 2.5 MG TABS tablet TAKE ONE TABLET TWICE  DAILY 180 tablet 1   FARXIGA 10 MG TABS tablet Take 10 mg by mouth daily.     fluticasone (FLONASE) 50 MCG/ACT nasal spray Place 1 spray into both nostrils daily as needed for allergies.     glucosamine-chondroitin 500-400 MG tablet Take 1 tablet by mouth 3 (three) times daily.     sodium chloride (OCEAN) 0.65 % SOLN nasal spray Place 1 spray into both nostrils as needed for congestion.     No current facility-administered medications for this visit.    Allergies  Allergen Reactions   Alendronate Swelling   Fosamax [Alendronate Sodium] Swelling    Joint swelling   Poison Ivy Extract     Severe hives, needed steroid taper last time   Trazodone And Nefazodone     Nightmares    Review of Systems negative except from HPI and PMH  Physical Exam BP 104/62   Pulse 78   Ht 5\' 5"  (1.651 m)   Wt 118 lb (53.5 kg)   SpO2 99%   BMI 19.64 kg/m  Well developed and nourished in no acute distress HENT normal Neck supple  Clear Regular rate and rhythm, no murmurs or gallops Abd-soft with active BS No Clubbing cyanosis edema  depenedent rubor Skin-warm and dry A & Oriented  Grossly normal sensory and motor function  ECG sinus @ 78 22/12/39   ECG coarse atrial flutter with a ventricular rate of 78 Intervals-/11/41 I am not entirely convinced that this is in atrial flutter and not sinus rhythm.  But she did have tachycardia the other day   sinus at 68 Interval 20/12/44 1/23 >> atrial fibrillation at 88  12/22 >>NSR 12/22  AFIB 5/22 >> NSR 6/21 >> NSR  Both Personally reviewed    Assessment and  Plan  Mitral valve prolapse  Persistent atrial fibrillation   HFpEF   PVC quiescient  IVCD  Parkinson's    No intercurrent palpitations.  No bleeding,  continue Apixaban    need Hgb  have reached out to PCP.  Probably decline  Euvolemic   Continuing to lose weight and not sleeping.Multiple issues with caring for her husband; suggested they get night sitters.

## 2023-02-20 DIAGNOSIS — Z23 Encounter for immunization: Secondary | ICD-10-CM | POA: Diagnosis not present

## 2023-02-25 ENCOUNTER — Other Ambulatory Visit: Payer: Self-pay | Admitting: Neurology

## 2023-03-14 ENCOUNTER — Encounter (INDEPENDENT_AMBULATORY_CARE_PROVIDER_SITE_OTHER): Payer: Medicare Other | Admitting: Ophthalmology

## 2023-03-14 DIAGNOSIS — H43813 Vitreous degeneration, bilateral: Secondary | ICD-10-CM

## 2023-03-14 DIAGNOSIS — D3132 Benign neoplasm of left choroid: Secondary | ICD-10-CM | POA: Diagnosis not present

## 2023-03-14 DIAGNOSIS — H348112 Central retinal vein occlusion, right eye, stable: Secondary | ICD-10-CM

## 2023-03-18 DIAGNOSIS — D2272 Melanocytic nevi of left lower limb, including hip: Secondary | ICD-10-CM | POA: Diagnosis not present

## 2023-03-18 DIAGNOSIS — L919 Hypertrophic disorder of the skin, unspecified: Secondary | ICD-10-CM | POA: Diagnosis not present

## 2023-03-18 DIAGNOSIS — Z85828 Personal history of other malignant neoplasm of skin: Secondary | ICD-10-CM | POA: Diagnosis not present

## 2023-03-18 DIAGNOSIS — L821 Other seborrheic keratosis: Secondary | ICD-10-CM | POA: Diagnosis not present

## 2023-03-18 DIAGNOSIS — L720 Epidermal cyst: Secondary | ICD-10-CM | POA: Diagnosis not present

## 2023-03-18 DIAGNOSIS — D485 Neoplasm of uncertain behavior of skin: Secondary | ICD-10-CM | POA: Diagnosis not present

## 2023-04-01 DIAGNOSIS — E785 Hyperlipidemia, unspecified: Secondary | ICD-10-CM | POA: Diagnosis not present

## 2023-04-01 DIAGNOSIS — E559 Vitamin D deficiency, unspecified: Secondary | ICD-10-CM | POA: Diagnosis not present

## 2023-04-01 DIAGNOSIS — I2510 Atherosclerotic heart disease of native coronary artery without angina pectoris: Secondary | ICD-10-CM | POA: Diagnosis not present

## 2023-04-05 DIAGNOSIS — H401221 Low-tension glaucoma, left eye, mild stage: Secondary | ICD-10-CM | POA: Diagnosis not present

## 2023-04-05 DIAGNOSIS — H524 Presbyopia: Secondary | ICD-10-CM | POA: Diagnosis not present

## 2023-04-05 DIAGNOSIS — H401212 Low-tension glaucoma, right eye, moderate stage: Secondary | ICD-10-CM | POA: Diagnosis not present

## 2023-04-05 DIAGNOSIS — D3132 Benign neoplasm of left choroid: Secondary | ICD-10-CM | POA: Diagnosis not present

## 2023-04-05 DIAGNOSIS — H04123 Dry eye syndrome of bilateral lacrimal glands: Secondary | ICD-10-CM | POA: Diagnosis not present

## 2023-04-08 DIAGNOSIS — R82998 Other abnormal findings in urine: Secondary | ICD-10-CM | POA: Diagnosis not present

## 2023-04-08 DIAGNOSIS — I341 Nonrheumatic mitral (valve) prolapse: Secondary | ICD-10-CM | POA: Diagnosis not present

## 2023-04-08 DIAGNOSIS — Z0000 Encounter for general adult medical examination without abnormal findings: Secondary | ICD-10-CM | POA: Diagnosis not present

## 2023-04-08 DIAGNOSIS — M1990 Unspecified osteoarthritis, unspecified site: Secondary | ICD-10-CM | POA: Diagnosis not present

## 2023-04-08 DIAGNOSIS — D692 Other nonthrombocytopenic purpura: Secondary | ICD-10-CM | POA: Diagnosis not present

## 2023-04-08 DIAGNOSIS — I480 Paroxysmal atrial fibrillation: Secondary | ICD-10-CM | POA: Diagnosis not present

## 2023-04-08 DIAGNOSIS — Z7901 Long term (current) use of anticoagulants: Secondary | ICD-10-CM | POA: Diagnosis not present

## 2023-04-08 DIAGNOSIS — Z1331 Encounter for screening for depression: Secondary | ICD-10-CM | POA: Diagnosis not present

## 2023-04-08 DIAGNOSIS — G20A1 Parkinson's disease without dyskinesia, without mention of fluctuations: Secondary | ICD-10-CM | POA: Diagnosis not present

## 2023-04-08 DIAGNOSIS — I2584 Coronary atherosclerosis due to calcified coronary lesion: Secondary | ICD-10-CM | POA: Diagnosis not present

## 2023-04-08 DIAGNOSIS — I700 Atherosclerosis of aorta: Secondary | ICD-10-CM | POA: Diagnosis not present

## 2023-04-08 DIAGNOSIS — Z1389 Encounter for screening for other disorder: Secondary | ICD-10-CM | POA: Diagnosis not present

## 2023-04-08 DIAGNOSIS — Z23 Encounter for immunization: Secondary | ICD-10-CM | POA: Diagnosis not present

## 2023-04-08 DIAGNOSIS — I5042 Chronic combined systolic (congestive) and diastolic (congestive) heart failure: Secondary | ICD-10-CM | POA: Diagnosis not present

## 2023-04-08 DIAGNOSIS — D6869 Other thrombophilia: Secondary | ICD-10-CM | POA: Diagnosis not present

## 2023-04-08 DIAGNOSIS — I2510 Atherosclerotic heart disease of native coronary artery without angina pectoris: Secondary | ICD-10-CM | POA: Diagnosis not present

## 2023-04-26 ENCOUNTER — Other Ambulatory Visit: Payer: Self-pay | Admitting: Neurology

## 2023-04-26 DIAGNOSIS — G20A1 Parkinson's disease without dyskinesia, without mention of fluctuations: Secondary | ICD-10-CM

## 2023-05-08 ENCOUNTER — Encounter: Payer: Self-pay | Admitting: Neurology

## 2023-05-15 ENCOUNTER — Other Ambulatory Visit: Payer: Self-pay | Admitting: Neurology

## 2023-05-15 DIAGNOSIS — G20A1 Parkinson's disease without dyskinesia, without mention of fluctuations: Secondary | ICD-10-CM

## 2023-05-17 ENCOUNTER — Other Ambulatory Visit (HOSPITAL_COMMUNITY): Payer: Self-pay | Admitting: *Deleted

## 2023-05-20 ENCOUNTER — Encounter (HOSPITAL_COMMUNITY)
Admission: RE | Admit: 2023-05-20 | Discharge: 2023-05-20 | Disposition: A | Discharge status: Home / Self Care | Payer: Medicare Other | Source: Ambulatory Visit | Attending: Internal Medicine | Admitting: Internal Medicine

## 2023-05-20 DIAGNOSIS — M810 Age-related osteoporosis without current pathological fracture: Secondary | ICD-10-CM | POA: Diagnosis not present

## 2023-05-20 MED ORDER — DENOSUMAB 60 MG/ML ~~LOC~~ SOSY
PREFILLED_SYRINGE | SUBCUTANEOUS | Status: AC
  Filled 2023-05-20: qty 1

## 2023-05-20 MED ORDER — DENOSUMAB 60 MG/ML ~~LOC~~ SOSY
60.0000 mg | PREFILLED_SYRINGE | Freq: Once | SUBCUTANEOUS | Status: AC
  Administered 2023-05-20: 60 mg via SUBCUTANEOUS

## 2023-06-11 ENCOUNTER — Other Ambulatory Visit: Payer: Self-pay | Admitting: Surgery

## 2023-06-11 DIAGNOSIS — R918 Other nonspecific abnormal finding of lung field: Secondary | ICD-10-CM

## 2023-06-11 DIAGNOSIS — Z86018 Personal history of other benign neoplasm: Secondary | ICD-10-CM

## 2023-06-11 NOTE — Progress Notes (Signed)
cx

## 2023-06-12 ENCOUNTER — Ambulatory Visit (INDEPENDENT_AMBULATORY_CARE_PROVIDER_SITE_OTHER): Payer: Medicare Other | Admitting: Surgery

## 2023-06-12 ENCOUNTER — Ambulatory Visit
Admission: RE | Admit: 2023-06-12 | Discharge: 2023-06-12 | Disposition: A | Discharge status: Home / Self Care | Payer: Medicare Other | Source: Ambulatory Visit | Attending: Surgery | Admitting: Surgery

## 2023-06-12 ENCOUNTER — Encounter: Payer: Self-pay | Admitting: Surgery

## 2023-06-12 DIAGNOSIS — Z86018 Personal history of other benign neoplasm: Secondary | ICD-10-CM

## 2023-06-12 DIAGNOSIS — R079 Chest pain, unspecified: Secondary | ICD-10-CM | POA: Diagnosis not present

## 2023-06-13 NOTE — Progress Notes (Signed)
    HPI:  The patient returns to my office today for followup status post left thoracotomy and wedge resection of a left lower lobe lung mass on 12/03/2005. 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 She has a history of paroxysmal atrial fibrillation on Eliquis and a history of Parkinson's disease.  She tells me that her husband, who is an old patient of mine, died in 20-Apr-2024.  She is still living at home alone with her caregivers that she originally got of her husband.  She denies any chest pain or pressure.  She has had no shortness of breath.  She has had significant weight loss over the past year.  Current Outpatient Medications  Medication Sig Dispense Refill   acetaminophen (TYLENOL) 500 MG tablet Take 500-1,000 mg by mouth every 6 (six) hours as needed for moderate pain.     Calcium Carb-Cholecalciferol (CALTRATE 600+D3 PO) Take 1 tablet by mouth daily.     carbidopa-levodopa (SINEMET CR) 50-200 MG tablet TAKE ONE TABLET AT BEDTIME 90 tablet 0   carbidopa-levodopa (SINEMET IR) 25-100 MG tablet TAKE ONE TABLET THREE TIMES DAILY AT 7AM 11AM 4PM 270 tablet 0   cholecalciferol (VITAMIN D3) 25 MCG (1000 UNIT) tablet Take 1,000 Units by mouth daily.     colchicine 0.6 MG tablet Take 0.6 mg by mouth daily as needed.     denosumab (PROLIA) 60 MG/ML SOSY injection Inject 60 mg into the skin every 6 (six) months.     ELIQUIS 2.5 MG TABS tablet TAKE ONE TABLET TWICE DAILY 180 tablet 1   FARXIGA 10 MG TABS tablet Take 10 mg by mouth daily.     fluticasone (FLONASE) 50 MCG/ACT nasal spray Place 1 spray into both nostrils daily as needed for allergies.     sodium chloride (OCEAN) 0.65 % SOLN nasal spray Place 1 spray into both nostrils as needed for congestion.     No current facility-administered medications for this visit.     Physical Exam: BP 118/76 (BP Location: Left Arm)   Pulse 82   Resp 18   Ht 5\' 5"  (1.651 m)   Wt 119 lb (54 kg)   SpO2 98%   BMI 19.80 kg/m  She  looks much thinner than last year. Cardiac exam shows a regular rate and rhythm with normal heart sounds. Lungs are clear. There is no peripheral edema.  Diagnostic Tests:  Narrative & Impression  CLINICAL DATA:  Lung mass.  Left-sided chest pain.   EXAM: CHEST - 2 VIEW   COMPARISON:  Chest radiograph dated 06/06/2022.   FINDINGS: No focal consolidation, pleural effusion, or pneumothorax. The cardiac silhouette is within normal limits. No acute osseous pathology.   IMPRESSION: No active cardiopulmonary disease.     Electronically Signed   By: Elgie Collard M.D.   On: 06/12/2023 10:42      Impression:  There is no evidence of recurrent tumor.  I reviewed the x-ray with her and answered her questions.   Plan:   I will plan to see her back in 1 year with a repeat chest x-ray.  I spent 10 minutes performing this established patient evaluation and > 50% of this time was spent face to face counseling and coordinating the surveillance of her previously resected pleural-based tumor    Alleen Borne, MD [facility] [phone]

## 2023-06-27 ENCOUNTER — Encounter (INDEPENDENT_AMBULATORY_CARE_PROVIDER_SITE_OTHER): Payer: Medicare Other | Admitting: Ophthalmology

## 2023-06-28 ENCOUNTER — Other Ambulatory Visit: Payer: Self-pay | Admitting: Internal Medicine

## 2023-06-28 DIAGNOSIS — I4819 Other persistent atrial fibrillation: Secondary | ICD-10-CM

## 2023-06-28 NOTE — Telephone Encounter (Signed)
 Prescription refill request for Eliquis received. Indication:afib Last office visit:10/24 [facility] Age: 88 Weight:54  kg  Prescription refilled

## 2023-07-22 ENCOUNTER — Other Ambulatory Visit: Payer: Self-pay | Admitting: Neurology

## 2023-07-22 DIAGNOSIS — G20A1 Parkinson's disease without dyskinesia, without mention of fluctuations: Secondary | ICD-10-CM

## 2023-07-23 NOTE — Progress Notes (Unsigned)
 Assessment/Plan:   1.  Parkinsons Disease  -continue carbidopa/levodopa 25/100, 1 po tid  -Continue carbidopa/levodopa 50/200 CR   -she has caregivers  -her sx's are very mild  -she sees dermatology once per year.  We discussed that it used to be thought that levodopa would increase risk of melanoma but now it is believed that Parkinsons itself likely increases risk of melanoma. she is to get regular skin checks.   2.  Recurrent atrial arrhythmias  -On Inderal as needed by cardiology.  -On apixaban  3.  nocturia  -discussed with her that we can refer her to urology if wanted.    -discussed day night reversal.  No naps after 2pm Subjective:   [name] was seen today in follow up for Parkinsons disease.  My previous records were reviewed prior to todays visit as well as outside records available to me.  Patient is taking her levodopa.  She has had no falls since our last visit.  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  I told her she needed to talk to her husband's physician about his sleep issues, rather than medicating her, as that was not going to be helpful.  I see that she saw her cardiologist not long after she saw me and those issues were reiterated to him, along with weight loss that he thought due to stress, and he suggested also that they hire night sitters.  Unfortunately, her husband died since our last visit.  She has kept the caregivers.  She does still get up a few times per night (2) to go to the bathroom but that is better.  She did have a fall 3 weeks ago; she fell over a wi-fi cable and fell on her L knee.  She was able to get up by pulling herself up on the bed.  This was her only fall.  She is using her cane.  Some day/night reversal.  Napping late in day.    Current prescribed movement disorder  medications: Carbidopa/levodopa 25/100, 1 tablet 3 times per day  Carbidopa/levodopa 50/200 CR at bed  ALLERGIES:   Allergies  Allergen Reactions   Alendronate Swelling   Fosamax [Alendronate Sodium] Swelling    Joint swelling   Poison Ivy Extract     Severe hives, needed steroid taper last time   Trazodone And Nefazodone     Nightmares    CURRENT MEDICATIONS:  Current Meds  Medication Sig   acetaminophen (TYLENOL) 500 MG tablet Take 500-1,000 mg by mouth every 6 (six) hours as needed for moderate pain.   Calcium Carb-Cholecalciferol (CALTRATE 600+D3 PO) Take 1 tablet by mouth daily.   carbidopa-levodopa (SINEMET CR) 50-200 MG tablet TAKE ONE TABLET AT BEDTIME   carbidopa-levodopa (SINEMET IR) 25-100 MG tablet TAKE ONE TABLET THREE TIMES DAILY AT 7AM 11AM 4PM   cholecalciferol (VITAMIN D3) 25 MCG (1000 UNIT) tablet Take 1,000 Units by mouth daily.   colchicine 0.6 MG tablet Take 0.6 mg by mouth daily as needed.   denosumab (PROLIA) 60 MG/ML SOSY injection Inject 60 mg into the skin every 6 (six) months.   ELIQUIS 2.5 MG TABS tablet TAKE ONE TABLET TWICE DAILY   FARXIGA 10 MG TABS tablet Take 10 mg by mouth daily.   fluticasone (FLONASE) 50 MCG/ACT nasal spray Place 1  spray into both nostrils daily as needed for allergies.   sodium chloride (OCEAN) 0.65 % SOLN nasal spray Place 1 spray into both nostrils as needed for congestion.     Objective:   PHYSICAL EXAMINATION:    VITALS:   Vitals:   07/24/23 1307  BP: 110/60  Pulse: 69  SpO2: 96%  Weight: 129 lb 6.4 oz (58.7 kg)  Height: 5\' 5"  (1.651 m)    GEN:  The patient appears stated age and is in NAD. HEENT:  Normocephalic, atraumatic.  The mucous membranes are moist. The superficial temporal arteries are without ropiness or tenderness. CV:  RRR Lungs:  CTAB Neck/HEME:  There are no carotid bruits bilaterally.  Neurological examination:  Orientation: The patient is alert and oriented x3. Cranial nerves: There is  good facial symmetry with min facial hypomimia. The speech is fluent and clear. Soft palate rises symmetrically and there is no tongue deviation. Hearing is intact to conversational tone. Sensation: Sensation is intact to light touch throughout Motor: Strength is at least antigravity x4.  Movement examination: Tone: There is nl tone in the UE/LE Abnormal movements: there is mild tremor in the RUE, intermittent (stable) Coordination:  There is no decremation with any form of RAMS, including alternating supination and pronation of the forearm, hand opening and closing, finger taps, heel taps and toe taps.  Gait and Station: The patient easily arises out of the chair.  The patient's stride length is purposeful with marching (same as last visit) and purposeful arm swing.  When she walked in the office (unaware that being watched), she had more physiologic gait, without shuffling and good swing.    Total time spent on today's visit was 30 minutes, including both face-to-face time and nonface-to-face time.  Time included that spent on review of records (prior notes available to me/labs/imaging if pertinent), discussing treatment and goals, answering patient's questions and coordinating care.    Cc:  Creola Corn, MD

## 2023-07-24 ENCOUNTER — Ambulatory Visit (INDEPENDENT_AMBULATORY_CARE_PROVIDER_SITE_OTHER): Payer: Medicare Other | Admitting: Neurology

## 2023-07-24 ENCOUNTER — Other Ambulatory Visit: Payer: Self-pay

## 2023-07-24 ENCOUNTER — Encounter: Payer: Self-pay | Admitting: Neurology

## 2023-07-24 VITALS — BP 110/60 | HR 69 | Ht 65.0 in | Wt 129.4 lb

## 2023-07-24 DIAGNOSIS — R351 Nocturia: Secondary | ICD-10-CM

## 2023-07-24 DIAGNOSIS — G20A1 Parkinson's disease without dyskinesia, without mention of fluctuations: Secondary | ICD-10-CM

## 2023-07-26 ENCOUNTER — Encounter (INDEPENDENT_AMBULATORY_CARE_PROVIDER_SITE_OTHER): Admitting: Ophthalmology

## 2023-07-26 ENCOUNTER — Ambulatory Visit: Payer: Medicare Other | Admitting: Neurology

## 2023-07-26 DIAGNOSIS — H348112 Central retinal vein occlusion, right eye, stable: Secondary | ICD-10-CM

## 2023-07-26 DIAGNOSIS — H43813 Vitreous degeneration, bilateral: Secondary | ICD-10-CM | POA: Diagnosis not present

## 2023-07-26 DIAGNOSIS — D3132 Benign neoplasm of left choroid: Secondary | ICD-10-CM

## 2023-07-31 ENCOUNTER — Other Ambulatory Visit: Payer: Self-pay | Admitting: Internal Medicine

## 2023-07-31 DIAGNOSIS — Z1231 Encounter for screening mammogram for malignant neoplasm of breast: Secondary | ICD-10-CM

## 2023-08-14 ENCOUNTER — Ambulatory Visit
Admission: RE | Admit: 2023-08-14 | Discharge: 2023-08-14 | Disposition: A | Discharge status: Home / Self Care | Source: Ambulatory Visit | Attending: Internal Medicine | Admitting: Internal Medicine

## 2023-08-14 DIAGNOSIS — Z1231 Encounter for screening mammogram for malignant neoplasm of breast: Secondary | ICD-10-CM | POA: Diagnosis not present

## 2023-08-19 ENCOUNTER — Encounter: Payer: Self-pay | Admitting: Internal Medicine

## 2023-08-19 ENCOUNTER — Ambulatory Visit: Payer: Medicare Other | Attending: Internal Medicine | Admitting: Internal Medicine

## 2023-08-19 VITALS — BP 100/64 | HR 76 | Ht 65.0 in | Wt 127.8 lb

## 2023-08-19 DIAGNOSIS — I4819 Other persistent atrial fibrillation: Secondary | ICD-10-CM | POA: Insufficient documentation

## 2023-08-19 DIAGNOSIS — Z95810 Presence of automatic (implantable) cardiac defibrillator: Secondary | ICD-10-CM | POA: Insufficient documentation

## 2023-08-19 DIAGNOSIS — I5032 Chronic diastolic (congestive) heart failure: Secondary | ICD-10-CM | POA: Insufficient documentation

## 2023-08-19 NOTE — Patient Instructions (Signed)
 Medication Instructions:  Your physician recommends that you continue on your current medications as directed. Please refer to the Current Medication list given to you today.  *If you need a refill on your cardiac medications before your next appointment, please call your pharmacy*  Lab Work: None ordered.  If you have labs (blood work) drawn today and your tests are completely normal, you will receive your results only by: MyChart Message (if you have MyChart) OR A paper copy in the mail If you have any lab test that is abnormal or we need to change your treatment, we will call you to review the results.  Testing/Procedures: None ordered.   Follow-Up: At [facility], you and your health needs are our priority.  As part of our continuing mission to provide you with exceptional heart care, our providers are all part of one team.  This team includes your primary Cardiologist (physician) and Advanced Practice Providers or APPs (Physician Assistants and Nurse Practitioners) who all work together to provide you with the care you need, when you need it.  Your next appointment:   6 months with Dr Doyle Generous PA      1st Floor: - Lobby - Registration  - Pharmacy  - Lab - Cafe  2nd Floor: - PV Lab - Diagnostic Testing (echo, CT, nuclear med)  3rd Floor: - Vacant  4th Floor: - TCTS (cardiothoracic surgery) - AFib Clinic - Structural Heart Clinic - Vascular Surgery  - Vascular Ultrasound  5th Floor: - HeartCare Cardiology (general and EP) - Clinical Pharmacy for coumadin, hypertension, lipid, weight-loss medications, and med management appointments    Valet parking services will be available as well.

## 2023-08-19 NOTE — Progress Notes (Signed)
 Patient Care Team: Margarete Sharps, MD as PCP - General (Internal Medicine) Verona Goodwill, MD as PCP - Cardiology (Cardiology) Bartley Lightning, MD (Cardiothoracic Surgery) Margarete Sharps, MD as Consulting Physician (Internal Medicine) Tat, Von Grumbling, DO as Consulting Physician (Neurology)  HPI  [name] is a 88 y.o. female seen in follow-up for palpitations associated with dizziness and fatigue.  Event recorder>>atrial fibrillation. anticoagulation with apixoban,    Multiple efforts of been undertaken to try to discern whether symptoms are associated with her paroxysms of atrial fibrillation, whether they are associated with variable rates in atrial fibrillation or unrelated to her A-fib at all.  There is no significant improvement with cardioversion, there been no significant awareness of atrial fibrillation at her last visit 1/23.  History of chest pain with negative stress echo   Intercurrent diagnosis with Parkinson's. Some gait instability and shortness of breath.  Husband died 2024/05/09     Wt Readings from Last 3 Encounters:  08/19/23 127 lb 12.8 oz (58 kg)  07/24/23 129 lb 6.4 oz (58.7 kg)  06/12/23 119 lb (54 kg)       DATE TEST EF   4/13 Echo   55-65 %   4/15 Echo  55-65%   6/21 Echo 40-45%   2/22 Echo 45-50%     Date Cr K Hgb  1/16 0.67  11.2  7/18 0.92  15.0  10/18   15.4   10/19 0.9 4.9 15.1  11/20 0.8 5.3 14.9  5/22 (CE) 0.8 5.0   11/23  4.2 14.9  6/24   13.9     Past Medical History:  Diagnosis Date   Allergic rhinitis    Arthritis    Atrial fibrillation (HCC)    BCC (basal cell carcinoma)    Benign tumor of pleura 2007   Bursitis    Bursitis of shoulder    Cataracts, bilateral    Complication of anesthesia    Cough since 05-09-2014   with sore throat   Diastolic dysfunction    Fracture    at l1   Fracture    right arm x 2   Glaucoma (increased eye pressure)    Hearing loss    History of blood transfusion age 35 or 8   Hyperlipidemia     IBS (irritable bowel syndrome)    Insomnia    Lung tumor (benign)    OA (osteoarthritis)    Osteopenia    Paroxysmal atrial fibrillation (HCC)    PONV (postoperative nausea and vomiting) 48 yrs ago   with ether   Sarcoidosis    As a child   Seasonal allergies    Tortuous aortic arch     Past Surgical History:  Procedure Laterality Date   ABDOMINAL HYSTERECTOMY     complete   APPENDECTOMY     FRACTURE SURGERY Right 2007   Left video-assisted thoracoscopy, left thoracotomy with wedge resection of left lower lobe lung mass.  Insertion of On-Q pain pump  12/03/2005   [facility]   ROTATOR CUFF REPAIR Left 2008   TONSILLECTOMY  as child   TOTAL HIP ARTHROPLASTY Right 05/18/2014   Procedure: RIGHT TOTAL HIP ARTHROPLASTY ANTERIOR APPROACH;  Surgeon: Bevin Bucks, MD;  Location: [facility];  Service: Orthopedics;  Laterality: Right;   VAGINAL HYSTERECTOMY     WRIST SURGERY      Current Outpatient Medications  Medication Sig Dispense Refill   acetaminophen  (TYLENOL ) 500 MG tablet Take 500-1,000 mg by mouth every 6 (  six) hours as needed for moderate pain.     Calcium Carb-Cholecalciferol (CALTRATE 600+D3 PO) Take 1 tablet by mouth daily.     carbidopa -levodopa  (SINEMET  CR) 50-200 MG tablet TAKE ONE TABLET AT BEDTIME 90 tablet 0   carbidopa -levodopa  (SINEMET  IR) 25-100 MG tablet TAKE ONE TABLET THREE TIMES DAILY AT 7AM 11AM 4PM 270 tablet 0   colchicine 0.6 MG tablet Take 0.6 mg by mouth daily as needed.     denosumab  (PROLIA ) 60 MG/ML SOSY injection Inject 60 mg into the skin every 6 (six) months.     ELIQUIS  2.5 MG TABS tablet TAKE ONE TABLET TWICE DAILY 180 tablet 1   FARXIGA 10 MG TABS tablet Take 10 mg by mouth daily.     fluticasone (FLONASE) 50 MCG/ACT nasal spray Place 1 spray into both nostrils daily as needed for allergies.     sodium chloride  (OCEAN) 0.65 % SOLN nasal spray Place 1 spray into both nostrils as needed for congestion.     No current facility-administered medications for  this visit.    Allergies  Allergen Reactions   Alendronate Swelling   Fosamax [Alendronate Sodium] Swelling    Joint swelling   Poison Ivy Extract     Severe hives, needed steroid taper last time   Trazodone And Nefazodone     Nightmares    Review of Systems negative except from HPI and PMH  Physical Exam BP 100/64   Pulse 76   Ht 5\' 5"  (1.651 m)   Wt 127 lb 12.8 oz (58 kg)   SpO2 95%   BMI 21.27 kg/m   Well developed and nourished in no acute distress HENT normal Neck supple with JVP-  flat  Clear Regular rate and rhythm, no murmurs or gallops Abd-soft with active BS No Clubbing cyanosis edema Skin-warm and dry A & Oriented  Grossly normal sensory and motor function Are not ECG sinus @ 76 23/ 12/ 43  ECG sinus @ 78 22/12/39   ECG coarse atrial flutter with a ventricular rate of 78 Intervals-/11/41 I am not entirely convinced that this is in atrial flutter and not sinus rhythm.  But she did have tachycardia the other day   sinus at 68 Interval 20/12/44 1/23 >> atrial fibrillation at 88  12/22 >>NSR 12/22  AFIB 5/22 >> NSR 6/21 >> NSR  Both Personally reviewed    Assessment and  Plan  Mitral valve prolapse  Persistent atrial fibrillation   HFpEF   PVC quiescient  IVCD  Parkinson's    Biggest issue seems to be balance related to her Parkinsons Grieving the loss of her husband Functional status actually improved, likely in part related to more sleep  No significant interval palps; continue Apixaban   dosed for age and weight

## 2023-08-20 ENCOUNTER — Other Ambulatory Visit: Payer: Self-pay | Admitting: Neurology

## 2023-08-20 DIAGNOSIS — G20A1 Parkinson's disease without dyskinesia, without mention of fluctuations: Secondary | ICD-10-CM

## 2023-09-02 DIAGNOSIS — D6869 Other thrombophilia: Secondary | ICD-10-CM | POA: Diagnosis not present

## 2023-09-02 DIAGNOSIS — B028 Zoster with other complications: Secondary | ICD-10-CM | POA: Diagnosis not present

## 2023-09-02 DIAGNOSIS — I480 Paroxysmal atrial fibrillation: Secondary | ICD-10-CM | POA: Diagnosis not present

## 2023-09-02 DIAGNOSIS — R21 Rash and other nonspecific skin eruption: Secondary | ICD-10-CM | POA: Diagnosis not present

## 2023-09-10 DIAGNOSIS — R3915 Urgency of urination: Secondary | ICD-10-CM | POA: Diagnosis not present

## 2023-10-07 DIAGNOSIS — I2510 Atherosclerotic heart disease of native coronary artery without angina pectoris: Secondary | ICD-10-CM | POA: Diagnosis not present

## 2023-10-07 DIAGNOSIS — I2584 Coronary atherosclerosis due to calcified coronary lesion: Secondary | ICD-10-CM | POA: Diagnosis not present

## 2023-10-07 DIAGNOSIS — I480 Paroxysmal atrial fibrillation: Secondary | ICD-10-CM | POA: Diagnosis not present

## 2023-10-07 DIAGNOSIS — I471 Supraventricular tachycardia, unspecified: Secondary | ICD-10-CM | POA: Diagnosis not present

## 2023-10-07 DIAGNOSIS — G20A1 Parkinson's disease without dyskinesia, without mention of fluctuations: Secondary | ICD-10-CM | POA: Diagnosis not present

## 2023-10-07 DIAGNOSIS — M8589 Other specified disorders of bone density and structure, multiple sites: Secondary | ICD-10-CM | POA: Diagnosis not present

## 2023-10-07 DIAGNOSIS — N39498 Other specified urinary incontinence: Secondary | ICD-10-CM | POA: Diagnosis not present

## 2023-10-07 DIAGNOSIS — M1990 Unspecified osteoarthritis, unspecified site: Secondary | ICD-10-CM | POA: Diagnosis not present

## 2023-10-07 DIAGNOSIS — I5189 Other ill-defined heart diseases: Secondary | ICD-10-CM | POA: Diagnosis not present

## 2023-10-07 DIAGNOSIS — I5042 Chronic combined systolic (congestive) and diastolic (congestive) heart failure: Secondary | ICD-10-CM | POA: Diagnosis not present

## 2023-10-07 DIAGNOSIS — K5900 Constipation, unspecified: Secondary | ICD-10-CM | POA: Diagnosis not present

## 2023-10-07 DIAGNOSIS — I341 Nonrheumatic mitral (valve) prolapse: Secondary | ICD-10-CM | POA: Diagnosis not present

## 2023-10-08 DIAGNOSIS — H401212 Low-tension glaucoma, right eye, moderate stage: Secondary | ICD-10-CM | POA: Diagnosis not present

## 2023-10-08 DIAGNOSIS — H401221 Low-tension glaucoma, left eye, mild stage: Secondary | ICD-10-CM | POA: Diagnosis not present

## 2023-10-21 ENCOUNTER — Other Ambulatory Visit: Payer: Self-pay | Admitting: Neurology

## 2023-10-21 DIAGNOSIS — G20A1 Parkinson's disease without dyskinesia, without mention of fluctuations: Secondary | ICD-10-CM

## 2023-10-25 ENCOUNTER — Encounter (INDEPENDENT_AMBULATORY_CARE_PROVIDER_SITE_OTHER): Admitting: Ophthalmology

## 2023-10-25 DIAGNOSIS — D3132 Benign neoplasm of left choroid: Secondary | ICD-10-CM | POA: Diagnosis not present

## 2023-10-25 DIAGNOSIS — H43813 Vitreous degeneration, bilateral: Secondary | ICD-10-CM | POA: Diagnosis not present

## 2023-10-25 DIAGNOSIS — H34811 Central retinal vein occlusion, right eye, with macular edema: Secondary | ICD-10-CM

## 2023-10-25 DIAGNOSIS — H35033 Hypertensive retinopathy, bilateral: Secondary | ICD-10-CM | POA: Diagnosis not present

## 2023-10-25 DIAGNOSIS — I10 Essential (primary) hypertension: Secondary | ICD-10-CM

## 2023-11-05 ENCOUNTER — Telehealth (HOSPITAL_COMMUNITY): Payer: Self-pay | Admitting: Pharmacy Technician

## 2023-11-05 NOTE — Telephone Encounter (Signed)
 Auth Submission: NO AUTH NEEDED Site of care: Site of care: [facility] Payer: MEDICARE A/B, BCBS SUPP Medication & CPT/J Code(s) submitted: Prolia  (Denosumab ) [id number] Diagnosis Code: M81.0 Route of submission (phone, fax, portal):  Phone # Fax # Auth type: Buy/Bill HB Units/visits requested: 60mg  x 2 doses Reference number:  Approval from: 11/05/23 to 05/30/24   Dagoberto Armour, CPhT [facility] [phone]

## 2023-11-06 DIAGNOSIS — R5383 Other fatigue: Secondary | ICD-10-CM | POA: Diagnosis not present

## 2023-11-08 ENCOUNTER — Encounter (INDEPENDENT_AMBULATORY_CARE_PROVIDER_SITE_OTHER): Admitting: Ophthalmology

## 2023-11-08 DIAGNOSIS — H26491 Other secondary cataract, right eye: Secondary | ICD-10-CM

## 2023-11-09 ENCOUNTER — Other Ambulatory Visit: Payer: Self-pay | Admitting: Neurology

## 2023-11-09 DIAGNOSIS — G20A1 Parkinson's disease without dyskinesia, without mention of fluctuations: Secondary | ICD-10-CM

## 2023-11-11 DIAGNOSIS — G4700 Insomnia, unspecified: Secondary | ICD-10-CM | POA: Diagnosis not present

## 2023-11-11 DIAGNOSIS — R4589 Other symptoms and signs involving emotional state: Secondary | ICD-10-CM | POA: Diagnosis not present

## 2023-11-11 DIAGNOSIS — R627 Adult failure to thrive: Secondary | ICD-10-CM | POA: Diagnosis not present

## 2023-11-14 DIAGNOSIS — Z96641 Presence of right artificial hip joint: Secondary | ICD-10-CM | POA: Diagnosis not present

## 2023-11-14 DIAGNOSIS — M170 Bilateral primary osteoarthritis of knee: Secondary | ICD-10-CM | POA: Diagnosis not present

## 2023-11-14 DIAGNOSIS — M5459 Other low back pain: Secondary | ICD-10-CM | POA: Diagnosis not present

## 2023-11-15 ENCOUNTER — Encounter (INDEPENDENT_AMBULATORY_CARE_PROVIDER_SITE_OTHER): Admitting: Ophthalmology

## 2023-11-15 DIAGNOSIS — Z961 Presence of intraocular lens: Secondary | ICD-10-CM

## 2023-11-29 IMAGING — MG MM DIGITAL SCREENING BILAT W/ TOMO AND CAD
6 of 10 series · 6 of 30 positions shown · non-contrast
Comparison: Previous exam(s).

CLINICAL DATA: Screening.

EXAM:
DIGITAL SCREENING BILATERAL MAMMOGRAM WITH TOMOSYNTHESIS AND CAD
TECHNIQUE: Bilateral screening digital craniocaudal and mediolateral oblique
mammograms were obtained. Bilateral screening digital breast
tomosynthesis was performed. The images were evaluated with
computer-aided detection.

[L CC synth-2D]
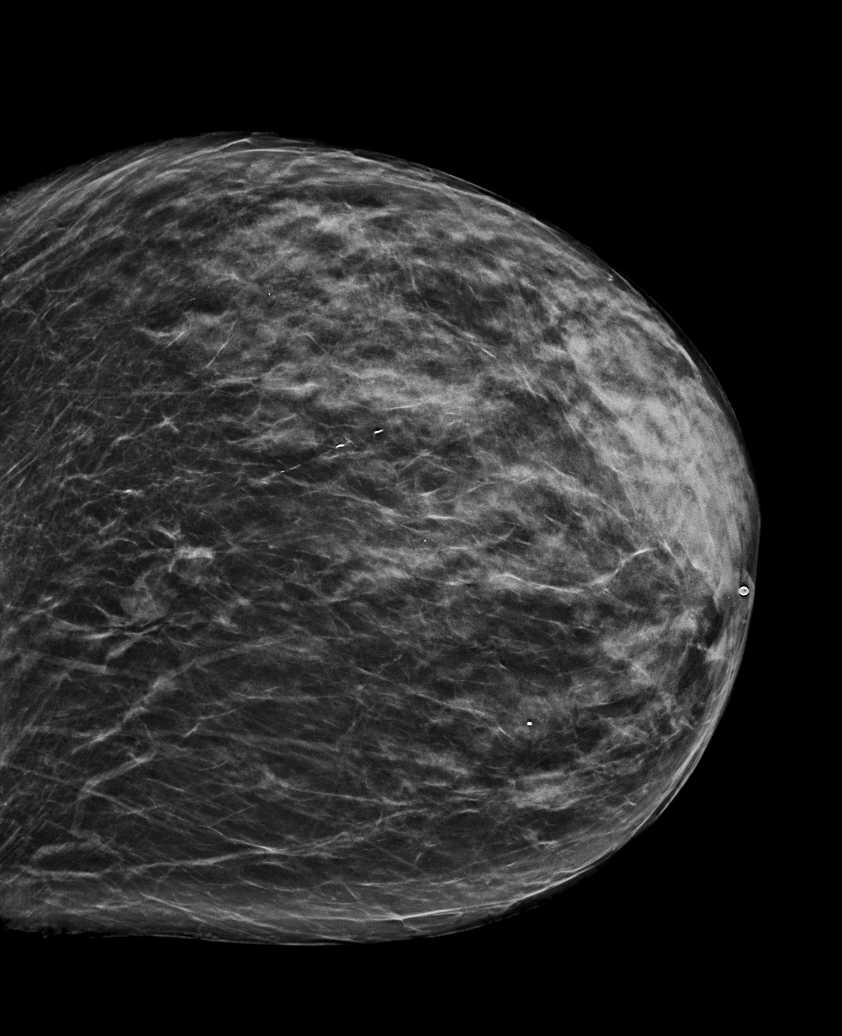

[L MLO synth-2D]
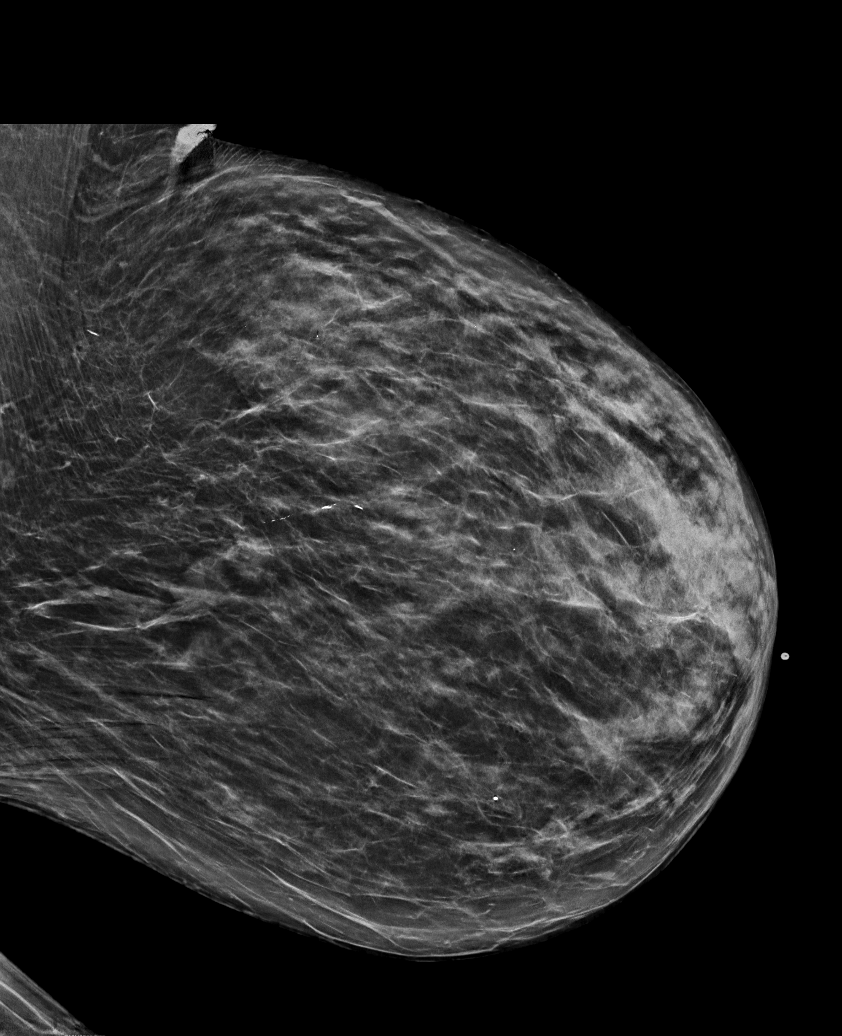

[R CC synth-2D (1 of 2)]
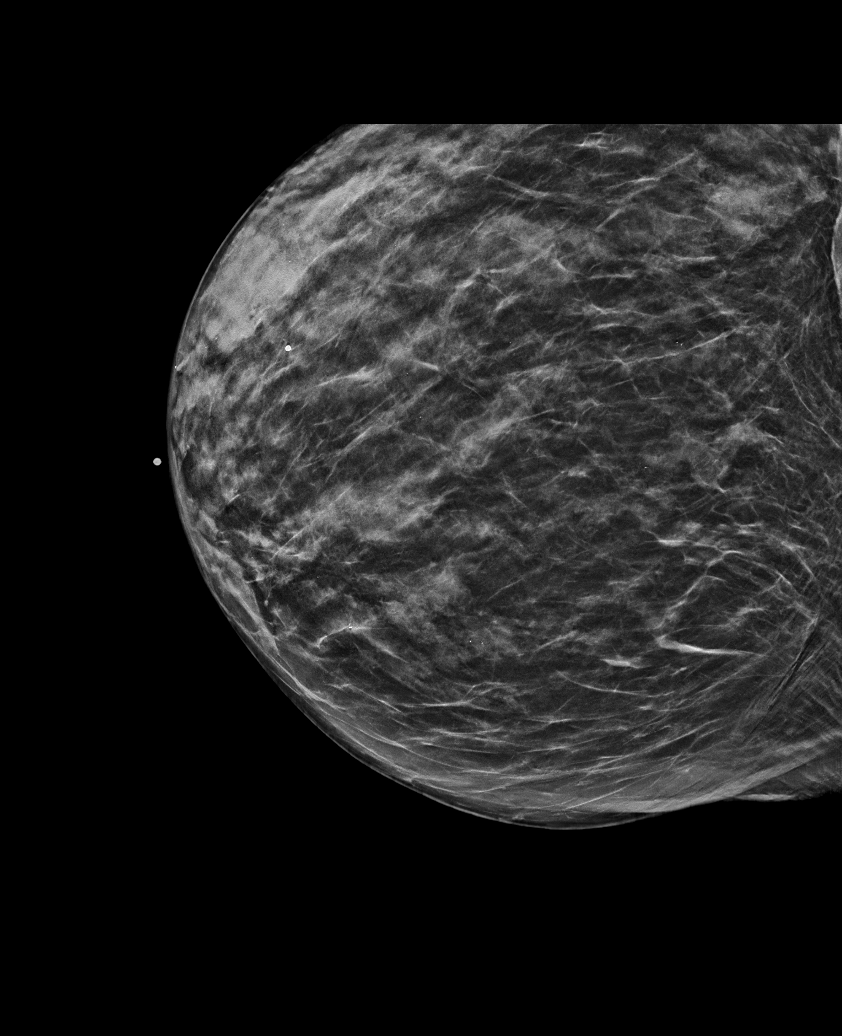

[R CC synth-2D (2 of 2)]
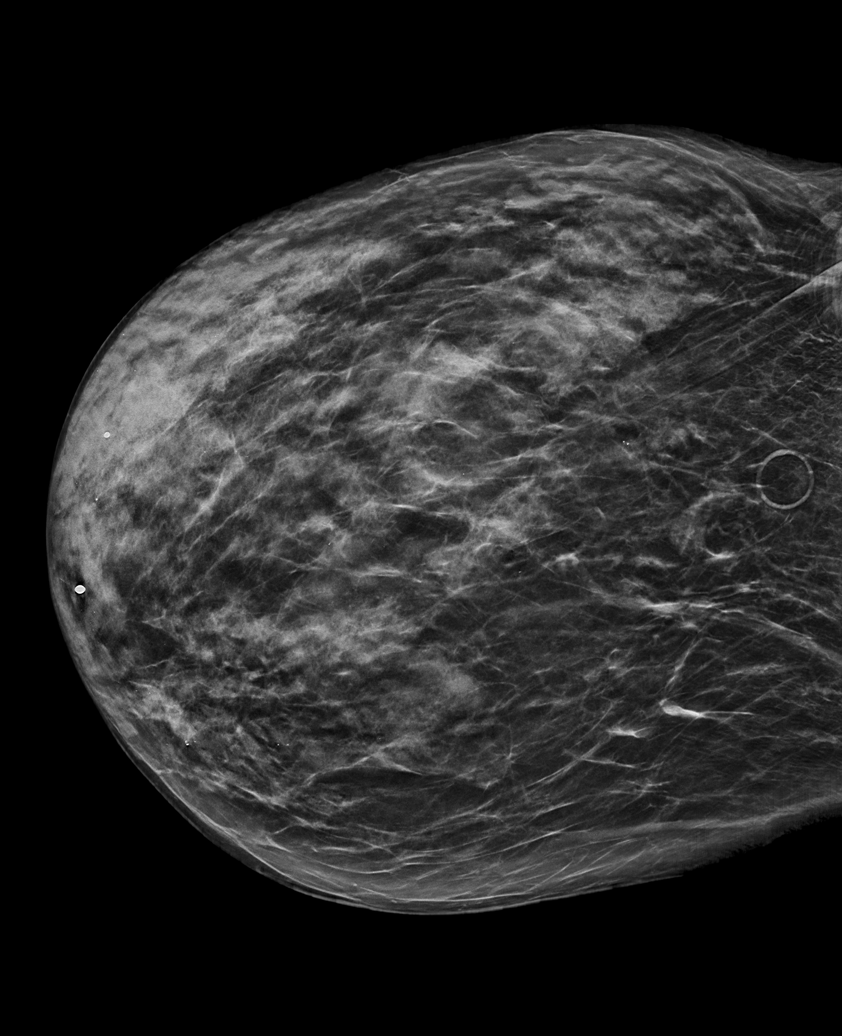

[R MLO synth-2D]
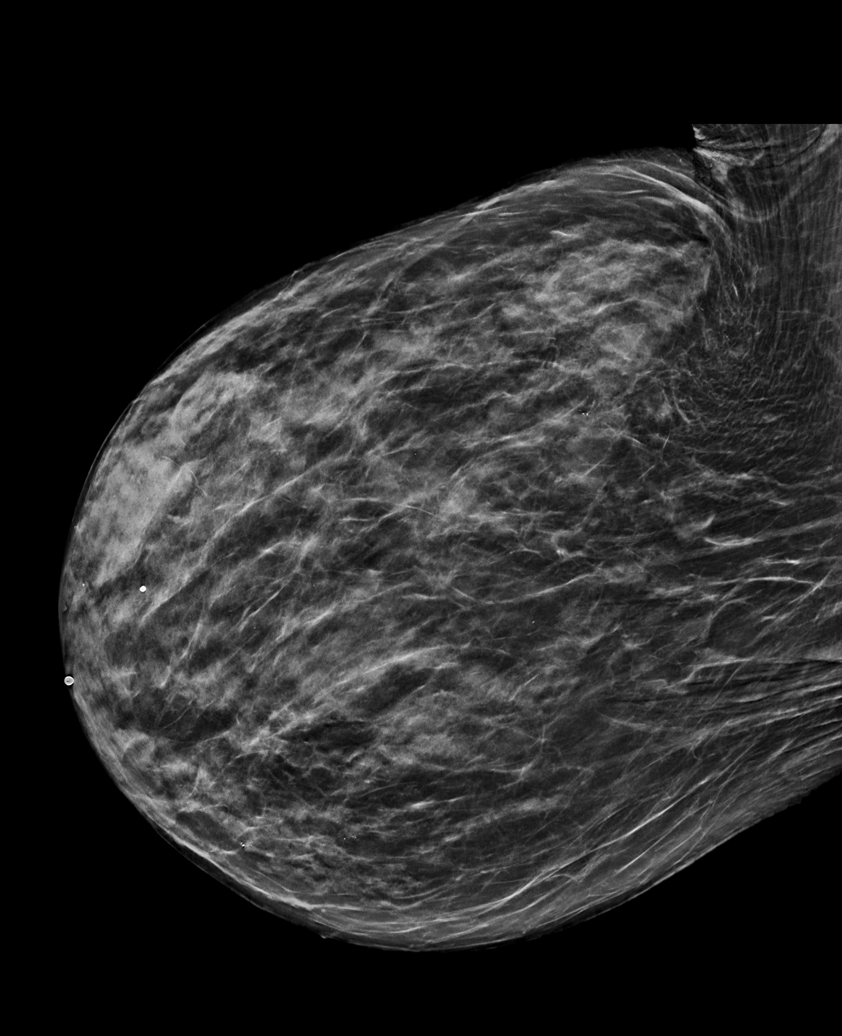

[R CC tomo · tomo slice 33/64.0]
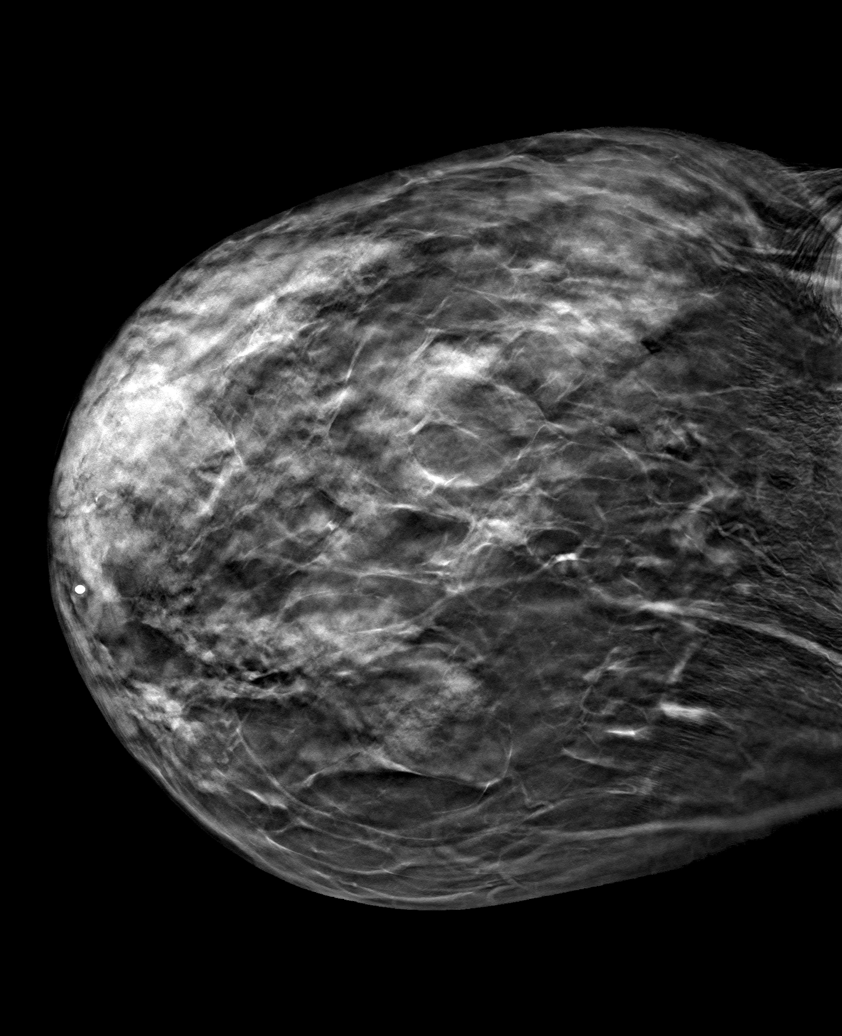

[6 of 30 positions shown; findings below may reference images not displayed]

ACR Breast Density Category c: The breast tissue is heterogeneously
dense, which may obscure small masses.
FINDINGS: There are no findings suspicious for malignancy.
IMPRESSION: No mammographic evidence of malignancy. A result letter of this
screening mammogram will be mailed directly to the patient.

RECOMMENDATION:
Screening mammogram in one year. (Code:Q3-W-BC3)

BI-RADS CATEGORY  1: Negative.

## 2023-12-02 ENCOUNTER — Telehealth: Payer: Self-pay | Admitting: Neurology

## 2023-12-02 ENCOUNTER — Other Ambulatory Visit: Payer: Self-pay

## 2023-12-02 DIAGNOSIS — G20A1 Parkinson's disease without dyskinesia, without mention of fluctuations: Secondary | ICD-10-CM

## 2023-12-02 MED ORDER — CARBIDOPA-LEVODOPA 25-100 MG PO TABS: ORAL_TABLET | ORAL | 0 refills | Status: DC

## 2023-12-02 NOTE — Telephone Encounter (Signed)
 Called [name] and sent emergency refills

## 2023-12-02 NOTE — Telephone Encounter (Signed)
 [name] called and left message with after hours service. Caller states she sent electronic refill request. Pt is out of town and forgot her medication at home.

## 2023-12-09 ENCOUNTER — Other Ambulatory Visit (HOSPITAL_COMMUNITY): Payer: Self-pay | Admitting: *Deleted

## 2023-12-09 DIAGNOSIS — R197 Diarrhea, unspecified: Secondary | ICD-10-CM | POA: Diagnosis not present

## 2023-12-09 DIAGNOSIS — R627 Adult failure to thrive: Secondary | ICD-10-CM | POA: Diagnosis not present

## 2023-12-09 DIAGNOSIS — G4700 Insomnia, unspecified: Secondary | ICD-10-CM | POA: Diagnosis not present

## 2023-12-09 DIAGNOSIS — R4589 Other symptoms and signs involving emotional state: Secondary | ICD-10-CM | POA: Diagnosis not present

## 2023-12-10 ENCOUNTER — Ambulatory Visit (HOSPITAL_COMMUNITY)
Admission: RE | Admit: 2023-12-10 | Discharge: 2023-12-10 | Disposition: A | Discharge status: Home / Self Care | Source: Ambulatory Visit | Attending: Internal Medicine | Admitting: Internal Medicine

## 2023-12-10 DIAGNOSIS — R197 Diarrhea, unspecified: Secondary | ICD-10-CM | POA: Diagnosis not present

## 2023-12-10 DIAGNOSIS — M810 Age-related osteoporosis without current pathological fracture: Secondary | ICD-10-CM | POA: Diagnosis not present

## 2023-12-10 MED ORDER — DENOSUMAB 60 MG/ML ~~LOC~~ SOSY
60.0000 mg | PREFILLED_SYRINGE | Freq: Once | SUBCUTANEOUS | Status: AC
  Administered 2023-12-10 (×2): 60 mg via SUBCUTANEOUS

## 2023-12-10 MED ORDER — DENOSUMAB 60 MG/ML ~~LOC~~ SOSY
PREFILLED_SYRINGE | SUBCUTANEOUS | Status: AC
Start: 2023-12-10 — End: 2023-12-10
  Filled 2023-12-10: qty 1

## 2023-12-13 ENCOUNTER — Other Ambulatory Visit: Payer: Self-pay | Admitting: Internal Medicine

## 2023-12-13 DIAGNOSIS — I4819 Other persistent atrial fibrillation: Secondary | ICD-10-CM

## 2023-12-13 NOTE — Telephone Encounter (Signed)
 Prescription refill request for Eliquis  received. Indication:AFIB Last office visit:4/25 Scr:0.7  2025 Age: 88 Weight:58  kg  Prescription refilled

## 2023-12-17 DIAGNOSIS — R058 Other specified cough: Secondary | ICD-10-CM | POA: Diagnosis not present

## 2023-12-17 DIAGNOSIS — R0981 Nasal congestion: Secondary | ICD-10-CM | POA: Diagnosis not present

## 2023-12-17 DIAGNOSIS — U071 COVID-19: Secondary | ICD-10-CM | POA: Diagnosis not present

## 2023-12-17 DIAGNOSIS — Z1152 Encounter for screening for COVID-19: Secondary | ICD-10-CM | POA: Diagnosis not present

## 2023-12-17 DIAGNOSIS — J029 Acute pharyngitis, unspecified: Secondary | ICD-10-CM | POA: Diagnosis not present

## 2023-12-17 DIAGNOSIS — G20A1 Parkinson's disease without dyskinesia, without mention of fluctuations: Secondary | ICD-10-CM | POA: Diagnosis not present

## 2023-12-17 DIAGNOSIS — Z7901 Long term (current) use of anticoagulants: Secondary | ICD-10-CM | POA: Diagnosis not present

## 2024-01-16 ENCOUNTER — Encounter (INDEPENDENT_AMBULATORY_CARE_PROVIDER_SITE_OTHER): Admitting: Ophthalmology

## 2024-01-16 DIAGNOSIS — D3132 Benign neoplasm of left choroid: Secondary | ICD-10-CM | POA: Diagnosis not present

## 2024-01-16 DIAGNOSIS — H43813 Vitreous degeneration, bilateral: Secondary | ICD-10-CM | POA: Diagnosis not present

## 2024-01-16 DIAGNOSIS — H34811 Central retinal vein occlusion, right eye, with macular edema: Secondary | ICD-10-CM

## 2024-01-23 NOTE — Progress Notes (Signed)
 Assessment/Plan:   1.  Parkinsons Disease  -continue carbidopa /levodopa  25/100, 1 po tid  -Continue carbidopa /levodopa  50/200 CR   -she has caregivers  -her sx's are very mild  -she sees dermatology once per year.  We discussed that it used to be thought that levodopa  would increase risk of melanoma but now it is believed that Parkinsons itself likely increases risk of melanoma. she is to get regular skin checks.   2.  Recurrent atrial arrhythmias  -On apixaban   3.  nocturia  -discussed with her that we can refer her to urology if wanted.    -discussed day night reversal.  No naps after 2pm  4.  Trigger finger, L ring  -discussed with her unrelated to Parkinsons Disease and needs f/u with orthopedics if bothersome Subjective:   [name] was seen today in follow up for Parkinsons disease.  My previous records were reviewed prior to todays visit as well as outside records available to me.  She has had no falls since last visit.  She is taking her levodopa  faithfully.  She has had no hallucinations/visual distortions.  She last saw cardiology April, 2025.   Notes are reviewed.  Pt notes occ lightheadedness.  She notes her left ring finger is getting stuck.  She wonders if its related to Parkinsons Disease.  She notes she was just overseas in [place] to see her daughter.  She used her WC most of the time due to uneven floors and long walks.  She is going back (to [place]) for [employer].  She is in PT one time per week  Current prescribed movement disorder medications: Carbidopa /levodopa  25/100, 1 tablet 3 times per day  Carbidopa /levodopa  50/200 CR at bed  ALLERGIES:   Allergies  Allergen Reactions   Alendronate Swelling   Fosamax [Alendronate Sodium] Swelling    Joint swelling   Poison Ivy Extract     Severe hives, needed steroid taper last time   Trazodone And Nefazodone     Nightmares    CURRENT MEDICATIONS:  Current Meds  Medication Sig   acetaminophen  (TYLENOL ) 500  MG tablet Take 500-1,000 mg by mouth every 6 (six) hours as needed for moderate pain.   Calcium Carb-Cholecalciferol (CALTRATE 600+D3 PO) Take 1 tablet by mouth daily.   carbidopa -levodopa  (SINEMET  CR) 50-200 MG tablet TAKE ONE TABLET AT BEDTIME   carbidopa -levodopa  (SINEMET  IR) 25-100 MG tablet Take 1 pill 3 times a day at 7 Am/ 11 Am /AND 4pm   colchicine 0.6 MG tablet Take 0.6 mg by mouth daily as needed.   denosumab  (PROLIA ) 60 MG/ML SOSY injection Inject 60 mg into the skin every 6 (six) months.   ELIQUIS  2.5 MG TABS tablet TAKE ONE TABLET BY MOUTH TWICE DAILY   FARXIGA 10 MG TABS tablet Take 10 mg by mouth daily.   fluticasone (FLONASE) 50 MCG/ACT nasal spray Place 1 spray into both nostrils daily as needed for allergies.   sodium chloride  (OCEAN) 0.65 % SOLN nasal spray Place 1 spray into both nostrils as needed for congestion.     Objective:   PHYSICAL EXAMINATION:    VITALS:   Vitals:   01/28/24 1510  BP: 118/70  Pulse: (!) 58  SpO2: 98%  Weight: 120 lb 9.6 oz (54.7 kg)     GEN:  The patient appears stated age and is in NAD. HEENT:  Normocephalic, atraumatic.  The mucous membranes are moist. The superficial temporal arteries are without ropiness or tenderness. CV:  brady.  regular  Lungs:  CTAB Neck/HEME:  There are no carotid bruits bilaterally.  Neurological examination:  Orientation: The patient is alert and oriented x3. Cranial nerves: There is good facial symmetry with min facial hypomimia. The speech is fluent and clear. Soft palate rises symmetrically and there is no tongue deviation. Hearing is intact to conversational tone. Sensation: Sensation is intact to light touch throughout Motor: Strength is at least antigravity x4.  Movement examination: Tone: There is nl tone in the UE/LE Abnormal movements: there is rare tremor at rest in the RUE Coordination:  There is no decremation with any form of RAMS, including alternating supination and pronation of the  forearm, hand opening and closing, finger taps, heel taps and toe taps.  Gait and Station: The patient pushes off to arise.  The patient's stride length is purposeful with marching (same as last few visits) and purposeful arm swing .  When she walked in the office (unaware that being watched), she had more physiologic gait, without shuffling and good swing.      Cc:  Onita Rush, MD

## 2024-01-28 ENCOUNTER — Ambulatory Visit (INDEPENDENT_AMBULATORY_CARE_PROVIDER_SITE_OTHER): Admitting: Neurology

## 2024-01-28 VITALS — BP 118/70 | HR 58 | Wt 120.6 lb

## 2024-01-28 DIAGNOSIS — M65342 Trigger finger, left ring finger: Secondary | ICD-10-CM | POA: Diagnosis not present

## 2024-01-28 DIAGNOSIS — G20A1 Parkinson's disease without dyskinesia, without mention of fluctuations: Secondary | ICD-10-CM | POA: Diagnosis not present

## 2024-01-28 MED ORDER — CARBIDOPA-LEVODOPA 25-100 MG PO TABS: ORAL_TABLET | ORAL | 2 refills | Status: AC

## 2024-01-28 NOTE — Patient Instructions (Signed)
 Join us  on social media and stay up to date with what is going on in the [place] regarding Parkinson's Disease and Movement Disorders!

## 2024-03-12 DIAGNOSIS — R3915 Urgency of urination: Secondary | ICD-10-CM | POA: Diagnosis not present

## 2024-03-30 DIAGNOSIS — G20A1 Parkinson's disease without dyskinesia, without mention of fluctuations: Secondary | ICD-10-CM | POA: Diagnosis not present

## 2024-03-30 DIAGNOSIS — M66241 Spontaneous rupture of extensor tendons, right hand: Secondary | ICD-10-CM | POA: Diagnosis not present

## 2024-03-30 DIAGNOSIS — M79642 Pain in left hand: Secondary | ICD-10-CM | POA: Diagnosis not present

## 2024-03-30 DIAGNOSIS — M79641 Pain in right hand: Secondary | ICD-10-CM | POA: Diagnosis not present

## 2024-03-30 DIAGNOSIS — M66242 Spontaneous rupture of extensor tendons, left hand: Secondary | ICD-10-CM | POA: Diagnosis not present

## 2024-04-06 DIAGNOSIS — E785 Hyperlipidemia, unspecified: Secondary | ICD-10-CM | POA: Diagnosis not present

## 2024-04-09 ENCOUNTER — Encounter (INDEPENDENT_AMBULATORY_CARE_PROVIDER_SITE_OTHER): Admitting: Ophthalmology

## 2024-04-09 DIAGNOSIS — H43813 Vitreous degeneration, bilateral: Secondary | ICD-10-CM

## 2024-04-09 DIAGNOSIS — H34811 Central retinal vein occlusion, right eye, with macular edema: Secondary | ICD-10-CM | POA: Diagnosis not present

## 2024-04-09 DIAGNOSIS — D3132 Benign neoplasm of left choroid: Secondary | ICD-10-CM | POA: Diagnosis not present

## 2024-04-13 DIAGNOSIS — R82998 Other abnormal findings in urine: Secondary | ICD-10-CM | POA: Diagnosis not present

## 2024-05-05 DIAGNOSIS — G20A1 Parkinson's disease without dyskinesia, without mention of fluctuations: Secondary | ICD-10-CM

## 2024-06-03 ENCOUNTER — Ambulatory Visit: Admitting: Surgery

## 2024-06-10 ENCOUNTER — Ambulatory Visit: Admitting: Surgery

## 2024-06-17 ENCOUNTER — Ambulatory Visit: Admitting: Surgery

## 2024-07-02 ENCOUNTER — Encounter (INDEPENDENT_AMBULATORY_CARE_PROVIDER_SITE_OTHER): Admitting: Ophthalmology

## 2024-08-04 ENCOUNTER — Ambulatory Visit: Admitting: Neurology
